# Patient Record
Sex: Male | Born: 1935 | Race: Black or African American | Hispanic: No | Smoking: Former smoker
Health system: Southern US, Community
[De-identification: ages and names within clinical notes are randomized; demographics above are authoritative.]

## PROBLEM LIST (undated history)

## (undated) DIAGNOSIS — Z87898 Personal history of other specified conditions: Secondary | ICD-10-CM

## (undated) DIAGNOSIS — G4700 Insomnia, unspecified: Secondary | ICD-10-CM

## (undated) DIAGNOSIS — I2510 Atherosclerotic heart disease of native coronary artery without angina pectoris: Secondary | ICD-10-CM

## (undated) DIAGNOSIS — E785 Hyperlipidemia, unspecified: Secondary | ICD-10-CM

## (undated) DIAGNOSIS — K759 Inflammatory liver disease, unspecified: Secondary | ICD-10-CM

## (undated) DIAGNOSIS — C9110 Chronic lymphocytic leukemia of B-cell type not having achieved remission: Secondary | ICD-10-CM

## (undated) DIAGNOSIS — G20 Parkinson's disease: Secondary | ICD-10-CM

## (undated) DIAGNOSIS — I739 Peripheral vascular disease, unspecified: Secondary | ICD-10-CM

## (undated) DIAGNOSIS — Z8679 Personal history of other diseases of the circulatory system: Secondary | ICD-10-CM

## (undated) DIAGNOSIS — K219 Gastro-esophageal reflux disease without esophagitis: Secondary | ICD-10-CM

## (undated) DIAGNOSIS — I499 Cardiac arrhythmia, unspecified: Secondary | ICD-10-CM

## (undated) DIAGNOSIS — F17201 Nicotine dependence, unspecified, in remission: Secondary | ICD-10-CM

## (undated) DIAGNOSIS — I10 Essential (primary) hypertension: Secondary | ICD-10-CM

## (undated) DIAGNOSIS — G459 Transient cerebral ischemic attack, unspecified: Secondary | ICD-10-CM

## (undated) DIAGNOSIS — R0602 Shortness of breath: Secondary | ICD-10-CM

## (undated) DIAGNOSIS — F0390 Unspecified dementia without behavioral disturbance: Secondary | ICD-10-CM

## (undated) HISTORY — DX: Nicotine dependence, unspecified, in remission: F17.201

## (undated) HISTORY — DX: Inflammatory liver disease, unspecified: K75.9

## (undated) HISTORY — DX: Atherosclerotic heart disease of native coronary artery without angina pectoris: I25.10

## (undated) HISTORY — DX: Peripheral vascular disease, unspecified: I73.9

## (undated) HISTORY — DX: Essential (primary) hypertension: I10

## (undated) HISTORY — DX: Hyperlipidemia, unspecified: E78.5

## (undated) HISTORY — DX: Personal history of other specified conditions: Z87.898

## (undated) HISTORY — DX: Parkinson's disease: G20

## (undated) HISTORY — DX: Transient cerebral ischemic attack, unspecified: G45.9

## (undated) HISTORY — DX: Personal history of other diseases of the circulatory system: Z86.79

## (undated) HISTORY — PX: TONSILLECTOMY: SUR1361

## (undated) HISTORY — PX: TRANSURETHRAL RESECTION OF PROSTATE: SHX73

## (undated) HISTORY — DX: Chronic lymphocytic leukemia of B-cell type not having achieved remission: C91.10

## (undated) HISTORY — DX: Insomnia, unspecified: G47.00

---

## 1987-08-28 HISTORY — PX: RETINAL DETACHMENT SURGERY: SHX105

## 1994-09-27 DIAGNOSIS — H409 Unspecified glaucoma: Secondary | ICD-10-CM | POA: Insufficient documentation

## 1997-08-27 HISTORY — PX: CORONARY ARTERY BYPASS GRAFT: SHX141

## 1998-04-18 ENCOUNTER — Inpatient Hospital Stay (HOSPITAL_COMMUNITY): Admission: AD | Admit: 1998-04-18 | Discharge: 1998-04-25 | Payer: Self-pay | Admitting: *Deleted

## 1998-04-20 ENCOUNTER — Encounter: Payer: Self-pay | Admitting: Thoracic Surgery (Cardiothoracic Vascular Surgery)

## 1998-04-21 ENCOUNTER — Encounter: Payer: Self-pay | Admitting: Thoracic Surgery (Cardiothoracic Vascular Surgery)

## 1998-08-07 ENCOUNTER — Observation Stay: Admission: EM | Admit: 1998-08-07 | Discharge: 1998-08-08 | Payer: Self-pay | Admitting: Emergency Medicine

## 1998-08-09 ENCOUNTER — Encounter: Admission: RE | Admit: 1998-08-09 | Discharge: 1998-08-09 | Payer: Self-pay | Admitting: Family Medicine

## 1999-01-24 ENCOUNTER — Inpatient Hospital Stay (HOSPITAL_COMMUNITY): Admission: EM | Admit: 1999-01-24 | Discharge: 1999-01-27 | Payer: Self-pay | Admitting: Cardiology

## 1999-07-26 ENCOUNTER — Ambulatory Visit (HOSPITAL_COMMUNITY): Admission: RE | Admit: 1999-07-26 | Discharge: 1999-07-27 | Payer: Self-pay | Admitting: *Deleted

## 2001-05-23 ENCOUNTER — Encounter: Admission: RE | Admit: 2001-05-23 | Discharge: 2001-05-23 | Payer: Self-pay | Admitting: Oncology

## 2001-05-23 ENCOUNTER — Encounter (HOSPITAL_COMMUNITY): Admission: RE | Admit: 2001-05-23 | Discharge: 2001-06-22 | Payer: Self-pay | Admitting: Rheumatology

## 2001-08-22 ENCOUNTER — Encounter: Payer: Self-pay | Admitting: Emergency Medicine

## 2001-08-22 ENCOUNTER — Emergency Department (HOSPITAL_COMMUNITY): Admission: EM | Admit: 2001-08-22 | Discharge: 2001-08-22 | Payer: Self-pay | Admitting: Emergency Medicine

## 2001-09-30 ENCOUNTER — Ambulatory Visit (HOSPITAL_COMMUNITY): Admission: RE | Admit: 2001-09-30 | Discharge: 2001-09-30 | Payer: Self-pay | Admitting: Internal Medicine

## 2002-02-14 ENCOUNTER — Inpatient Hospital Stay (HOSPITAL_COMMUNITY): Admission: EM | Admit: 2002-02-14 | Discharge: 2002-02-16 | Payer: Self-pay | Admitting: *Deleted

## 2002-02-14 ENCOUNTER — Encounter: Payer: Self-pay | Admitting: Internal Medicine

## 2002-02-14 ENCOUNTER — Encounter: Payer: Self-pay | Admitting: *Deleted

## 2002-02-16 ENCOUNTER — Encounter: Payer: Self-pay | Admitting: Internal Medicine

## 2002-02-17 ENCOUNTER — Ambulatory Visit (HOSPITAL_COMMUNITY): Admission: RE | Admit: 2002-02-17 | Discharge: 2002-02-17 | Payer: Self-pay | Admitting: Cardiology

## 2002-02-24 ENCOUNTER — Encounter: Admission: RE | Admit: 2002-02-24 | Discharge: 2002-02-24 | Payer: Self-pay | Admitting: Oncology

## 2002-08-25 ENCOUNTER — Emergency Department (HOSPITAL_COMMUNITY): Admission: EM | Admit: 2002-08-25 | Discharge: 2002-08-25 | Payer: Self-pay | Admitting: Emergency Medicine

## 2002-08-25 ENCOUNTER — Encounter: Payer: Self-pay | Admitting: Emergency Medicine

## 2002-08-25 ENCOUNTER — Inpatient Hospital Stay (HOSPITAL_COMMUNITY): Admission: EM | Admit: 2002-08-25 | Discharge: 2002-08-27 | Payer: Self-pay | Admitting: Cardiovascular Disease

## 2003-02-26 ENCOUNTER — Encounter (HOSPITAL_COMMUNITY): Admission: RE | Admit: 2003-02-26 | Discharge: 2003-03-28 | Payer: Self-pay | Admitting: Oncology

## 2003-02-26 ENCOUNTER — Encounter: Admission: RE | Admit: 2003-02-26 | Discharge: 2003-02-26 | Payer: Self-pay | Admitting: Oncology

## 2003-03-05 ENCOUNTER — Ambulatory Visit (HOSPITAL_COMMUNITY): Admission: RE | Admit: 2003-03-05 | Discharge: 2003-03-05 | Payer: Self-pay | Admitting: Pulmonary Disease

## 2003-07-28 ENCOUNTER — Ambulatory Visit (HOSPITAL_COMMUNITY): Admission: RE | Admit: 2003-07-28 | Discharge: 2003-07-28 | Payer: Self-pay | Admitting: Internal Medicine

## 2004-03-03 ENCOUNTER — Encounter: Admission: RE | Admit: 2004-03-03 | Discharge: 2004-03-03 | Payer: Self-pay | Admitting: Oncology

## 2004-03-03 ENCOUNTER — Encounter (HOSPITAL_COMMUNITY): Admission: RE | Admit: 2004-03-03 | Discharge: 2004-04-02 | Payer: Self-pay | Admitting: Oncology

## 2004-04-04 ENCOUNTER — Encounter: Admission: RE | Admit: 2004-04-04 | Discharge: 2004-04-04 | Payer: Self-pay | Admitting: Oncology

## 2004-04-17 ENCOUNTER — Other Ambulatory Visit: Admission: RE | Admit: 2004-04-17 | Discharge: 2004-04-17 | Payer: Self-pay | Admitting: Dermatology

## 2004-07-03 ENCOUNTER — Emergency Department (HOSPITAL_COMMUNITY): Admission: EM | Admit: 2004-07-03 | Discharge: 2004-07-03 | Payer: Self-pay | Admitting: Emergency Medicine

## 2004-09-04 ENCOUNTER — Emergency Department (HOSPITAL_COMMUNITY): Admission: EM | Admit: 2004-09-04 | Discharge: 2004-09-04 | Payer: Self-pay | Admitting: Emergency Medicine

## 2004-09-04 ENCOUNTER — Ambulatory Visit: Payer: Self-pay | Admitting: Orthopedic Surgery

## 2004-09-18 ENCOUNTER — Ambulatory Visit: Payer: Self-pay | Admitting: Orthopedic Surgery

## 2004-10-31 ENCOUNTER — Ambulatory Visit: Payer: Self-pay | Admitting: Cardiology

## 2004-11-15 ENCOUNTER — Encounter (HOSPITAL_COMMUNITY): Admission: RE | Admit: 2004-11-15 | Discharge: 2004-12-15 | Payer: Self-pay | Admitting: Cardiology

## 2004-11-15 ENCOUNTER — Ambulatory Visit: Payer: Self-pay | Admitting: *Deleted

## 2004-12-18 ENCOUNTER — Ambulatory Visit: Payer: Self-pay | Admitting: Orthopedic Surgery

## 2005-03-15 ENCOUNTER — Ambulatory Visit: Payer: Self-pay | Admitting: Orthopedic Surgery

## 2005-04-04 ENCOUNTER — Ambulatory Visit (HOSPITAL_COMMUNITY): Payer: Self-pay | Admitting: Oncology

## 2005-04-04 ENCOUNTER — Encounter (HOSPITAL_COMMUNITY): Admission: RE | Admit: 2005-04-04 | Discharge: 2005-05-04 | Payer: Self-pay | Admitting: Oncology

## 2005-04-04 ENCOUNTER — Encounter: Admission: RE | Admit: 2005-04-04 | Discharge: 2005-04-04 | Payer: Self-pay | Admitting: Oncology

## 2005-05-21 ENCOUNTER — Encounter: Admission: RE | Admit: 2005-05-21 | Discharge: 2005-05-25 | Payer: Self-pay | Admitting: Oncology

## 2005-05-21 ENCOUNTER — Encounter (HOSPITAL_COMMUNITY): Admission: RE | Admit: 2005-05-21 | Discharge: 2005-05-25 | Payer: Self-pay | Admitting: Oncology

## 2005-07-24 ENCOUNTER — Encounter (HOSPITAL_COMMUNITY): Admission: RE | Admit: 2005-07-24 | Discharge: 2005-08-23 | Payer: Self-pay | Admitting: Oncology

## 2005-07-24 ENCOUNTER — Ambulatory Visit (HOSPITAL_COMMUNITY): Payer: Self-pay | Admitting: Oncology

## 2005-07-24 ENCOUNTER — Encounter: Admission: RE | Admit: 2005-07-24 | Discharge: 2005-07-24 | Payer: Self-pay | Admitting: Oncology

## 2005-08-12 ENCOUNTER — Inpatient Hospital Stay (HOSPITAL_COMMUNITY): Admission: EM | Admit: 2005-08-12 | Discharge: 2005-08-15 | Payer: Self-pay | Admitting: Emergency Medicine

## 2005-08-13 ENCOUNTER — Ambulatory Visit: Payer: Self-pay | Admitting: *Deleted

## 2005-08-16 ENCOUNTER — Ambulatory Visit (HOSPITAL_COMMUNITY): Admission: RE | Admit: 2005-08-16 | Discharge: 2005-08-16 | Payer: Self-pay | Admitting: Pulmonary Disease

## 2005-09-14 ENCOUNTER — Encounter (INDEPENDENT_AMBULATORY_CARE_PROVIDER_SITE_OTHER): Payer: Self-pay | Admitting: Urology

## 2005-09-14 ENCOUNTER — Inpatient Hospital Stay (HOSPITAL_COMMUNITY): Admission: RE | Admit: 2005-09-14 | Discharge: 2005-09-17 | Payer: Self-pay | Admitting: Urology

## 2005-12-04 ENCOUNTER — Emergency Department (HOSPITAL_COMMUNITY): Admission: EM | Admit: 2005-12-04 | Discharge: 2005-12-04 | Payer: Self-pay | Admitting: Emergency Medicine

## 2005-12-10 ENCOUNTER — Ambulatory Visit (HOSPITAL_COMMUNITY): Admission: RE | Admit: 2005-12-10 | Discharge: 2005-12-10 | Payer: Self-pay | Admitting: Pulmonary Disease

## 2005-12-10 ENCOUNTER — Ambulatory Visit: Payer: Self-pay | Admitting: Cardiology

## 2005-12-11 ENCOUNTER — Ambulatory Visit (HOSPITAL_COMMUNITY): Payer: Self-pay | Admitting: Oncology

## 2005-12-11 ENCOUNTER — Encounter (HOSPITAL_COMMUNITY): Admission: RE | Admit: 2005-12-11 | Discharge: 2006-01-10 | Payer: Self-pay | Admitting: Oncology

## 2005-12-11 ENCOUNTER — Encounter: Admission: RE | Admit: 2005-12-11 | Discharge: 2005-12-11 | Payer: Self-pay | Admitting: Oncology

## 2006-02-12 ENCOUNTER — Ambulatory Visit: Payer: Self-pay | Admitting: Internal Medicine

## 2006-02-15 ENCOUNTER — Ambulatory Visit: Payer: Self-pay | Admitting: Internal Medicine

## 2006-02-15 ENCOUNTER — Ambulatory Visit (HOSPITAL_COMMUNITY): Admission: RE | Admit: 2006-02-15 | Discharge: 2006-02-15 | Payer: Self-pay | Admitting: Internal Medicine

## 2006-05-07 ENCOUNTER — Ambulatory Visit (HOSPITAL_COMMUNITY): Admission: RE | Admit: 2006-05-07 | Discharge: 2006-05-07 | Payer: Self-pay | Admitting: Ophthalmology

## 2006-05-08 ENCOUNTER — Encounter: Admission: RE | Admit: 2006-05-08 | Discharge: 2006-05-24 | Payer: Self-pay | Admitting: Oncology

## 2006-05-08 ENCOUNTER — Encounter (HOSPITAL_COMMUNITY): Admission: RE | Admit: 2006-05-08 | Discharge: 2006-05-24 | Payer: Self-pay | Admitting: Oncology

## 2006-05-08 ENCOUNTER — Ambulatory Visit (HOSPITAL_COMMUNITY): Payer: Self-pay | Admitting: Oncology

## 2006-07-08 ENCOUNTER — Emergency Department (HOSPITAL_COMMUNITY): Admission: EM | Admit: 2006-07-08 | Discharge: 2006-07-08 | Payer: Self-pay | Admitting: Emergency Medicine

## 2006-09-04 ENCOUNTER — Ambulatory Visit (HOSPITAL_COMMUNITY): Payer: Self-pay | Admitting: Oncology

## 2006-09-04 ENCOUNTER — Encounter (HOSPITAL_COMMUNITY): Admission: RE | Admit: 2006-09-04 | Discharge: 2006-10-04 | Payer: Self-pay | Admitting: Oncology

## 2006-10-13 ENCOUNTER — Emergency Department (HOSPITAL_COMMUNITY): Admission: EM | Admit: 2006-10-13 | Discharge: 2006-10-13 | Payer: Self-pay | Admitting: Emergency Medicine

## 2007-03-07 ENCOUNTER — Ambulatory Visit (HOSPITAL_COMMUNITY): Payer: Self-pay | Admitting: Oncology

## 2007-03-07 ENCOUNTER — Encounter (HOSPITAL_COMMUNITY): Admission: RE | Admit: 2007-03-07 | Discharge: 2007-04-06 | Payer: Self-pay | Admitting: Oncology

## 2007-09-16 IMAGING — NM NM MYOCAR PERF EJECTION FRACTION
2 series · 12 of 12 positions shown · non-contrast
Comparison: none

CLINICAL DATA: 84-year-old gentleman with prior CABG surgery admitted for diaphoresis  accompanied by mild chest discomfort and dyspnea.  
STRESS MYOVIEW STUDY:
Radionuclide data:  One day stress/rest protocol performed with [DATE] mCi Nc-TTm Myoview.  
Stress data:  Treadmill exercise to a workload of 9 mets and a heart rate of 138, 100% of age-predicted maximum.  Exercise discontinued due to claudication and fatigue; no chest pain nor dyspnea described.  Blood pressure increased from a resting value of 145/70 to 170/80 during exercise and 200/80 early in recovery, a minimally hypertensive response.  No significant arrhythmias--few PVC?s and paired PVC?s.  
EKG:  Sinus bradycardia with atrial bigeminy; indeterminate axis; prior anteroseptal myocardial infarction; lateral loss of T-wave voltage.  
Stress EKG:  Insignificant upsloping ST segment depression.  
Scintigraphic data:  Acquisition notable for minor movement for which no correction was applied.  There was mild diaphragmatic attenuation.  Left ventricular size was normal.  On tomographic images reconstructed in standard planes, there was a very small area of thinning in the basilar inferior wall.  This was not numerically significant, and no reversibility was apparent.
The gated reconstruction demonstrated normal regional and global LV systolic function as well as normal systolic accentuation of activity throughout.  Estimated ejection fraction was .68.

[Series 1: cr cardiac tc low dose · 6.52mm/px · 6 of 64 frames shown]
[frame 6/64]
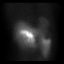
[frame 16/64]
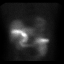
[frame 27/64]
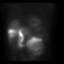
[frame 38/64]
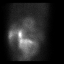
[frame 48/64]
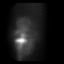
[frame 59/64]
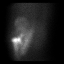

[Series 1: cs cardiac tc hi dose · 6.52mm/px · 6 of 512 frames shown]
[frame 43/512]
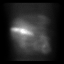
[frame 128/512]
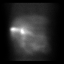
[frame 214/512]
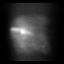
[frame 299/512]
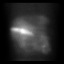
[frame 384/512]
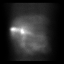
[frame 470/512]
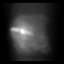

[12 of 12 positions shown; findings below may reference images not displayed]

IMPRESSION: Negative stress Myoview study revealing adequate exercise tolerance, no significant stress-induced EKG abnormalities, normal left ventricular size and normal left ventricular systolic function.  By scintigraphic imaging, there was mild diaphragmatic attenuation without evidence for ischemia or infarction.  Other findings as noted.

## 2007-09-23 ENCOUNTER — Emergency Department (HOSPITAL_COMMUNITY): Admission: EM | Admit: 2007-09-23 | Discharge: 2007-09-23 | Payer: Self-pay | Admitting: Emergency Medicine

## 2007-10-08 ENCOUNTER — Ambulatory Visit (HOSPITAL_COMMUNITY): Payer: Self-pay | Admitting: Oncology

## 2007-10-30 ENCOUNTER — Ambulatory Visit: Payer: Self-pay | Admitting: Cardiology

## 2007-11-12 ENCOUNTER — Ambulatory Visit: Payer: Self-pay | Admitting: Cardiology

## 2007-11-12 ENCOUNTER — Encounter: Payer: Self-pay | Admitting: Cardiology

## 2007-11-12 ENCOUNTER — Ambulatory Visit (HOSPITAL_COMMUNITY): Admission: RE | Admit: 2007-11-12 | Discharge: 2007-11-12 | Payer: Self-pay | Admitting: Cardiology

## 2007-12-24 ENCOUNTER — Ambulatory Visit (HOSPITAL_COMMUNITY): Admission: RE | Admit: 2007-12-24 | Discharge: 2007-12-24 | Payer: Self-pay | Admitting: Cardiology

## 2007-12-30 ENCOUNTER — Ambulatory Visit: Payer: Self-pay | Admitting: Cardiology

## 2008-08-27 DIAGNOSIS — G20 Parkinson's disease: Secondary | ICD-10-CM

## 2008-08-27 DIAGNOSIS — G20A1 Parkinson's disease without dyskinesia, without mention of fluctuations: Secondary | ICD-10-CM

## 2008-08-27 HISTORY — DX: Parkinson's disease: G20

## 2008-08-27 HISTORY — DX: Parkinson's disease without dyskinesia, without mention of fluctuations: G20.A1

## 2008-10-06 ENCOUNTER — Encounter (HOSPITAL_COMMUNITY): Admission: RE | Admit: 2008-10-06 | Discharge: 2008-11-05 | Payer: Self-pay | Admitting: Oncology

## 2008-10-06 ENCOUNTER — Ambulatory Visit (HOSPITAL_COMMUNITY): Payer: Self-pay | Admitting: Oncology

## 2008-10-26 ENCOUNTER — Ambulatory Visit: Payer: Self-pay | Admitting: Cardiology

## 2008-10-26 ENCOUNTER — Inpatient Hospital Stay (HOSPITAL_COMMUNITY): Admission: EM | Admit: 2008-10-26 | Discharge: 2008-10-28 | Payer: Self-pay | Admitting: Emergency Medicine

## 2009-02-01 ENCOUNTER — Encounter: Payer: Self-pay | Admitting: Physician Assistant

## 2009-02-01 ENCOUNTER — Ambulatory Visit: Payer: Self-pay | Admitting: Cardiology

## 2009-02-10 ENCOUNTER — Encounter: Payer: Self-pay | Admitting: Cardiology

## 2009-02-24 ENCOUNTER — Encounter: Payer: Self-pay | Admitting: Cardiology

## 2009-02-24 LAB — CONVERTED CEMR LAB
HDL: 60 mg/dL (ref >39)
LDL Cholesterol: 87 mg/dL (ref 0–99)
Triglycerides: 90 mg/dL (ref <150)
VLDL: 18 mg/dL (ref 0–40)

## 2009-03-08 ENCOUNTER — Telehealth (INDEPENDENT_AMBULATORY_CARE_PROVIDER_SITE_OTHER): Payer: Self-pay

## 2009-03-10 ENCOUNTER — Encounter (INDEPENDENT_AMBULATORY_CARE_PROVIDER_SITE_OTHER): Payer: Self-pay | Admitting: *Deleted

## 2009-06-21 ENCOUNTER — Encounter (INDEPENDENT_AMBULATORY_CARE_PROVIDER_SITE_OTHER): Payer: Self-pay | Admitting: *Deleted

## 2009-06-21 ENCOUNTER — Ambulatory Visit: Payer: Self-pay | Admitting: Cardiology

## 2009-06-21 ENCOUNTER — Ambulatory Visit (HOSPITAL_COMMUNITY): Admission: RE | Admit: 2009-06-21 | Discharge: 2009-06-21 | Payer: Self-pay | Admitting: Cardiology

## 2009-06-21 DIAGNOSIS — I2510 Atherosclerotic heart disease of native coronary artery without angina pectoris: Secondary | ICD-10-CM | POA: Insufficient documentation

## 2009-06-21 DIAGNOSIS — G459 Transient cerebral ischemic attack, unspecified: Secondary | ICD-10-CM | POA: Insufficient documentation

## 2009-06-21 DIAGNOSIS — Z954 Presence of other heart-valve replacement: Secondary | ICD-10-CM | POA: Insufficient documentation

## 2009-06-21 DIAGNOSIS — Z8669 Personal history of other diseases of the nervous system and sense organs: Secondary | ICD-10-CM | POA: Insufficient documentation

## 2009-06-21 DIAGNOSIS — I739 Peripheral vascular disease, unspecified: Secondary | ICD-10-CM | POA: Insufficient documentation

## 2009-06-30 ENCOUNTER — Encounter: Payer: Self-pay | Admitting: Cardiology

## 2009-07-06 ENCOUNTER — Ambulatory Visit (HOSPITAL_COMMUNITY): Admission: RE | Admit: 2009-07-06 | Discharge: 2009-07-06 | Payer: Self-pay | Admitting: Neurology

## 2009-08-02 ENCOUNTER — Encounter: Payer: Self-pay | Admitting: Cardiology

## 2009-08-02 ENCOUNTER — Encounter (INDEPENDENT_AMBULATORY_CARE_PROVIDER_SITE_OTHER): Payer: Self-pay | Admitting: *Deleted

## 2009-08-02 LAB — CONVERTED CEMR LAB
Albumin: 4.2 g/dL
Alkaline Phosphatase: 84 units/L
BUN: 15 mg/dL
CO2: 21 meq/L
Calcium: 9.1 mg/dL
Cholesterol: 250 mg/dL
Glucose, Bld: 97 mg/dL
Sodium: 143 meq/L

## 2009-08-04 ENCOUNTER — Encounter (INDEPENDENT_AMBULATORY_CARE_PROVIDER_SITE_OTHER): Payer: Self-pay | Admitting: *Deleted

## 2009-08-04 LAB — CONVERTED CEMR LAB
ALT: 14 units/L (ref 0–53)
AST: 19 units/L (ref 0–37)
Alkaline Phosphatase: 84 units/L (ref 39–117)
Creatinine, Ser: 1.07 mg/dL (ref 0.40–1.50)
LDL Cholesterol: 154 mg/dL — ABNORMAL HIGH (ref 0–99)
Sodium: 143 meq/L (ref 135–145)
Total Bilirubin: 0.6 mg/dL (ref 0.3–1.2)
Total CHOL/HDL Ratio: 3.4
Total Protein: 6.8 g/dL (ref 6.0–8.3)
VLDL: 22 mg/dL (ref 0–40)

## 2009-08-05 ENCOUNTER — Telehealth (INDEPENDENT_AMBULATORY_CARE_PROVIDER_SITE_OTHER): Payer: Self-pay | Admitting: *Deleted

## 2009-08-27 HISTORY — PX: COLONOSCOPY: SHX174

## 2009-09-04 ENCOUNTER — Emergency Department (HOSPITAL_COMMUNITY): Admission: EM | Admit: 2009-09-04 | Discharge: 2009-09-05 | Payer: Self-pay | Admitting: Emergency Medicine

## 2009-10-05 ENCOUNTER — Ambulatory Visit (HOSPITAL_COMMUNITY): Payer: Self-pay | Admitting: Oncology

## 2009-10-18 ENCOUNTER — Encounter (INDEPENDENT_AMBULATORY_CARE_PROVIDER_SITE_OTHER): Payer: Self-pay | Admitting: *Deleted

## 2009-10-18 LAB — CONVERTED CEMR LAB
HDL: 65 mg/dL
Triglycerides: 132 mg/dL

## 2009-10-25 ENCOUNTER — Encounter (INDEPENDENT_AMBULATORY_CARE_PROVIDER_SITE_OTHER): Payer: Self-pay

## 2009-10-25 ENCOUNTER — Encounter: Payer: Self-pay | Admitting: Cardiology

## 2009-10-25 LAB — CONVERTED CEMR LAB
Cholesterol: 235 mg/dL — ABNORMAL HIGH (ref 0–200)
LDL Cholesterol: 144 mg/dL — ABNORMAL HIGH (ref 0–99)
Triglycerides: 132 mg/dL (ref <150)

## 2009-10-26 ENCOUNTER — Encounter (INDEPENDENT_AMBULATORY_CARE_PROVIDER_SITE_OTHER): Payer: Self-pay

## 2009-10-26 ENCOUNTER — Encounter: Payer: Self-pay | Admitting: Cardiology

## 2009-12-29 ENCOUNTER — Encounter (INDEPENDENT_AMBULATORY_CARE_PROVIDER_SITE_OTHER): Payer: Self-pay | Admitting: *Deleted

## 2009-12-29 LAB — CONVERTED CEMR LAB
HDL: 79 mg/dL (ref >39)
LDL Cholesterol: 76 mg/dL (ref 0–99)
Total CHOL/HDL Ratio: 2.2
Triglycerides: 73 mg/dL
VLDL: 15 mg/dL (ref 0–40)

## 2010-01-02 ENCOUNTER — Encounter (INDEPENDENT_AMBULATORY_CARE_PROVIDER_SITE_OTHER): Payer: Self-pay | Admitting: *Deleted

## 2010-03-08 ENCOUNTER — Encounter (INDEPENDENT_AMBULATORY_CARE_PROVIDER_SITE_OTHER): Payer: Self-pay | Admitting: *Deleted

## 2010-03-16 ENCOUNTER — Ambulatory Visit: Payer: Self-pay | Admitting: Cardiology

## 2010-03-16 LAB — CONVERTED CEMR LAB
AST: 18 units/L (ref 0–37)
Alkaline Phosphatase: 86 units/L (ref 39–117)
BUN: 19 mg/dL (ref 6–23)
Basophils Relative: 0 % (ref 0–1)
Creatinine, Ser: 1.19 mg/dL (ref 0.40–1.50)
Eosinophils Absolute: 0 10*3/uL (ref 0.0–0.7)
Hemoglobin: 12.9 g/dL — ABNORMAL LOW (ref 13.0–17.0)
MCHC: 33.9 g/dL (ref 30.0–36.0)
MCV: 88.6 fL (ref 78.0–100.0)
Monocytes Absolute: 0.3 10*3/uL (ref 0.1–1.0)
Monocytes Relative: 9 % (ref 3–12)
RBC: 4.3 M/uL (ref 4.22–5.81)
Total Bilirubin: 0.5 mg/dL (ref 0.3–1.2)

## 2010-09-28 NOTE — Miscellaneous (Signed)
**Note De-Identified Grazia Taffe Obfuscation** Summary: Medications update  Clinical Lists Changes  Medications: Changed medication from SIMVASTATIN 40 MG TABS (SIMVASTATIN) Take 1 tablet by mouth at bedtime to SIMVASTATIN 80 MG TABS (SIMVASTATIN) take 1 tablet at bedtime - Signed Changed medication from NIASPAN 1000 MG CR-TABS (NIACIN (ANTIHYPERLIPIDEMIC)) take one half table daily x2 weeks then 1 tablet x2 weeks, then one and one half tablets daily to NIASPAN 1000 MG CR-TABS (NIACIN (ANTIHYPERLIPIDEMIC)) Take 1 and 1/2 tablets by mouth at bedtime - Signed Rx of SIMVASTATIN 80 MG TABS (SIMVASTATIN) take 1 tablet at bedtime;  #30 x 6;  Signed;  Entered by: Larita Fife Lisaann Atha LPN;  Authorized by: Kathlen Brunswick, MD, [facility];  Method used: Electronically to [facility] [address] [phone]*, [address] [place], [place]  [address], Ph: [phone], Fax: [phone] Rx of NIASPAN 1000 MG CR-TABS (NIACIN (ANTIHYPERLIPIDEMIC)) Take 1 and 1/2 tablets by mouth at bedtime;  #45 x 6;  Signed;  Entered by: Larita Fife Kyrie Bun LPN;  Authorized by: Kathlen Brunswick, MD, [facility];  Method used: Electronically to [facility] [address] [phone]*, [address] [place], [place]  [address], Ph: [phone], Fax: [phone]    Prescriptions: NIASPAN 1000 MG CR-TABS (NIACIN (ANTIHYPERLIPIDEMIC)) Take 1 and 1/2 tablets by mouth at bedtime  #45 x 6   Entered by:   Larita Fife Wanona Stare LPN   Authorized by:   Kathlen Brunswick, MD, [facility]   Signed by:   Larita Fife Lamondre Wesche LPN on 63/87/5643   Method used:   Electronically to        [facility] [phone]* (retail)       [address] [place], [place]  [address]       Ph: [phone]       Fax: [phone]   RxID:   [id number] SIMVASTATIN 80 MG TABS (SIMVASTATIN) take 1 tablet at bedtime  #30 x 6   Entered by:   Larita Fife Daisja Kessinger LPN   Authorized by:   Kathlen Brunswick, MD, [facility]   Signed by:   Larita Fife Cassandra Mcmanaman LPN on 51/76/1607   Method used:   Electronically to        [facility] [phone]* (retail)       [address] [place], [place]   [address]       Ph: [phone]       Fax: [phone]   RxID:   [id number]

## 2010-09-28 NOTE — Miscellaneous (Signed)
Summary: labs cmp,lipids,08/02/2009,lipids 10/18/2009,lipids 12/29/2009  Clinical Lists Changes  Observations: Added new observation of LDL: 76 mg/dL (81/19/1478 29:56) Added new observation of HDL: 79 mg/dL (21/30/8657 84:69) Added new observation of TRIGLYC TOT: 73 mg/dL (62/95/2841 32:44) Added new observation of CHOLESTEROL: 170 mg/dL (08/29/7251 66:44) Added new observation of LDL: 144 mg/dL (03/47/4259 56:38) Added new observation of HDL: 65 mg/dL (75/64/3329 51:88) Added new observation of TRIGLYC TOT: 132 mg/dL (41/66/0630 16:01) Added new observation of CHOLESTEROL: 235 mg/dL (09/32/3557 32:20) Added new observation of CALCIUM: 9.1 mg/dL (25/42/7062 37:62) Added new observation of ALBUMIN: 4.2 g/dL (83/15/1761 60:73) Added new observation of PROTEIN, TOT: 6.8 g/dL (71/01/2693 85:46) Added new observation of SGPT (ALT): 14 units/L (08/02/2009 14:07) Added new observation of SGOT (AST): 19 units/L (08/02/2009 14:07) Added new observation of ALK PHOS: 84 units/L (08/02/2009 14:07) Added new observation of CREATININE: 1.07 mg/dL (27/10/5007 38:18) Added new observation of BUN: 15 mg/dL (29/93/7169 67:89) Added new observation of BG RANDOM: 97 mg/dL (38/05/1750 02:58) Added new observation of CO2 PLSM/SER: 21 meq/L (08/02/2009 14:07) Added new observation of CL SERUM: 108 meq/L (08/02/2009 14:07) Added new observation of K SERUM: 4.6 meq/L (08/02/2009 14:07) Added new observation of NA: 143 meq/L (08/02/2009 14:07) Added new observation of LDL: 154 mg/dL (52/77/8242 35:36) Added new observation of HDL: 74 mg/dL (14/43/1540 08:67) Added new observation of TRIGLYC TOT: 112 mg/dL (61/95/0932 67:12) Added new observation of CHOLESTEROL: 250 mg/dL (45/80/9983 38:25)

## 2010-09-28 NOTE — Letter (Signed)
Summary: Handout Printed  Printed Handout:  - Diet - Low-Cholesterol Guidelines

## 2010-09-28 NOTE — Letter (Signed)
Summary: [facility] Results Engineer, agricultural at [facility]  [address], [place] [address]   Phone: [phone]  Fax: [phone]      Jan 02, 2010 MRN: [id number]   [name] [address] [place], [place]  [address]   Dear [name],  Your test ordered by Selena Batten has been reviewed by your physician (or physician assistant) and was found to be normal or stable. Your physician (or physician assistant) felt no changes were needed at this time.  ____ Echocardiogram  ____ Cardiac Stress Test  __x  Lab Work  ____ Peripheral vascular study of arms, legs or neck  ____ CT scan or X-ray  ____ Lung or Breathing test  ____ Other:  No change in medical treatment at this time, per Dr. Dietrich Pates.  Enclosed is a copy of your labwork for your records.  Thank you, Selah Klang Allyne Gee RN    Scott Bing, MD, [facility].C.Gaylord Shih, MD, F.A.C.C [name], MD, F.A.C.C Nona Dell, MD, F.A.C.C Charlton Haws, MD, [facility].C.C

## 2010-09-28 NOTE — Miscellaneous (Signed)
Summary: chest xray 09/04/2009  Clinical Lists Changes  Observations: Added new observation of CXR RESULTS:  Clinical Data: Dizziness.  Weakness.    CHEST - 2 VIEW    Comparison: 06/21/2009.    Findings: CABG/median sternotomy.  No airspace disease or effusion.   Monitoring leads are projected over the chest.  The   cardiopericardial silhouette appears within normal limits.   Emphysema.  Trachea and paratracheal soft tissues appear within   normal limits.    IMPRESSION:   Emphysema and CABG.  No acute cardiopulmonary disease.    Read By:  Wynn Banker   Released By:  Wynn Banker  Additional Information (09/04/2009 14:14)       CXR  Procedure date:  09/04/2009  Findings:       Clinical Data: Dizziness.  Weakness.    CHEST - 2 VIEW    Comparison: 06/21/2009.    Findings: CABG/median sternotomy.  No airspace disease or effusion.   Monitoring leads are projected over the chest.  The   cardiopericardial silhouette appears within normal limits.   Emphysema.  Trachea and paratracheal soft tissues appear within   normal limits.    IMPRESSION:   Emphysema and CABG.  No acute cardiopulmonary disease.    Read By:  Wynn Banker   Released By:  Wynn Banker  Additional Information

## 2010-09-28 NOTE — Assessment & Plan Note (Signed)
Summary: 8 mth f/u per checkout on 06/21/09/tg  Medications Added LISINOPRIL-HYDROCHLOROTHIAZIDE 20-12.5 MG TABS (LISINOPRIL-HYDROCHLOROTHIAZIDE) Take 1 tablet by mouth once a day      Allergies Added: ! * AMBIEN  Visit Type:  8 month follow up Referring Provider:  Neurology-Dr. Gerilyn Pilgrim Primary Provider:  Dr. Juanetta Gosling   History of Present Illness: Mr. [name] returns to the office as scheduled for continuing assessment and treatment of a history of aortic valve disease with subsequent AVR and coronary artery disease with CABG surgery in 1999.   He required percutaneous intervention 11 years ago, but no subsequent attention for coronary or valvular disease.  His most recent cardiac catheterization was in 2003 and echocardiogram in 2006.  He denies orthopnea, PND, chest discomfort, lightheadedness, syncope or exertional dyspnea.  Lifestyle is sedentary.  He was evaluated by a neurologist, who verified the diagnosis of Parkinson's disease, which he characterized as mild, associated with mild dementia.  No immediate medical therapy was advised.  Patient was given Ambien for sleep, but this resulted in deterioration in mental status.  Preventive Screening-Counseling & Management  Alcohol-Tobacco     Smoking Status: quit  Current Medications (verified): 1)  Omeprazole 20 Mg Cpdr (Omeprazole) .... Take 1 Tablet By Mouth Once A Day 2)  Lisinopril-Hydrochlorothiazide 20-12.5 Mg Tabs (Lisinopril-Hydrochlorothiazide) .... Take 1 Tablet By Mouth Once A Day 3)  Aspirin 81 Mg Tbec (Aspirin) .... Take One Tablet By Mouth Daily 4)  Xalatan 0.005 % Soln (Latanoprost) .Marland Kitchen.. 1 Drop Both Eyes Daily 5)  Daily Multi  Tabs (Multiple Vitamins-Minerals) .... Take 1 Tab Daily 6)  Simvastatin 80 Mg Tabs (Simvastatin) .... Take 1 Tablet At Bedtime  Allergies (verified): 1)  ! * Shell Fish 2)  ! * Ambien  Past History:  PMH, FH, and Social History reviewed and updated.  Past Medical  History: ATHEROSCLEROTIC CARDIOVASCULAR DISEASE (ICD-429.2)-coronary artery bypass graft and bioprosthetic aortic valve replacement in 1999; PCI of the right coronary in 2000; catheter in 07/2002 revealed total insertion of the left anterior descending; patent LIMA graft; total obstruction of the saphenous vein graft to the first marginal with a patent terminal limb between the first and second marginals; patent circumflex stent; nondominant RCA; normal ejection fraction. 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  Vital Signs:  Patient profile:   77 year old male Height:      66 inches Weight:      163 pounds BMI:     26.40 O2 Sat:      97 % on Room air Temp:     98.5 degrees F oral Pulse rate:   59 / minute BP sitting:   141 / 69  (left arm)  Vitals Entered ByLarita Fife Via LPN (March 16, 2010 11:20 AM)  O2 Flow:  Room air  Physical Exam  General:  Proportionate height and weight; well developed; no acute distress; Suboptimal recall of history and symptoms;    Neck-No JVD; no carotid bruits: Lungs-No tachypnea, no rales;mild expiratory rhonchi bilaterally Cardiovascular-normal PMI; normal S1 and S2; grade 1-2/6 systolic ejection murmur Abdomen-BS normal; soft and non-tender without  masses or organomegaly:  Musculoskeletal-No deformities, no cyanosis or clubbing: Neurologic-Normal cranial nerves; symmetric strength and tone; mild tremor; decreased  facial expression; shuffling gait; bradykinesia Skin-Warm, no significant lesions: Extremities-absent dorsalis pedis pulses and minimal, if any, posterior tibial pulses; right DP cannot be located with Doppler; other pulses are monophasic and deformed in contour; no edema:     Impression & Recommendations:  Problem # 1:  ATHEROSCLEROTIC CARDIOVASCULAR DISEASE (ICD-429.2) Patient is doing very well from a symptomatic standpoint.  Control of cardiovascular risk factors is excellent.  Current medications will be continued.  Metabolic profile and CBC are pending.  Problem # 2:  HYPERTENSION (ICD-401.9) Blood pressure control is good.  Medications will be monitored both in terms of effectiveness and possible adverse effects.  Appropriate laboratory studies are pending.  Problem # 3:  HYPERLIPIDEMIA (ICD-272.4) Recent lipid profile was excellent with total cholesterol of 170, triglycerides of 73, HDL 79 and LDL 76.  Simvastatin dosage is maximal, but continuation is appropriate based upon recent FDA guidelines.  I will reassess this nice gentleman in one year.  Problem # 4:  PARKINSON'S DISEASE (ICD-332.0) We will verify that patient is scheduled for a return visit with his neurologist.  He evidently has a degree of dementia and might benefit from medical therapy either for dementia in general or for Parkinson's disease.  Problem # 5:  PERIPHERAL VASCULAR DISEASE (ICD-443.9) Circulation appears to be at least moderately and perhaps severely impaired by examination with a handheld Doppler, but patient denies any symptoms.  Accordingly, no further testing nor therapy is warranted.  Other Orders: T-Comprehensive Metabolic Panel [phone]) T-CBC w/Diff [phone])  Patient Instructions: 1)  Your physician recommends that you schedule a follow-up appointment in: 1 year 2)  Your physician recommends that you return for lab work in: today 3)  Make sure you have a follow up appt with Dr.  Gerilyn Pilgrim within the next 6 months  Prevention & Chronic Care Immunizations   Influenza vaccine: Not documented    Tetanus booster: Not documented    Pneumococcal vaccine: Not documented    H. zoster vaccine: Not documented  Colorectal Screening   Hemoccult: Not documented    Colonoscopy: Not documented  Other Screening   PSA: Not documented   Smoking status: quit  (03/16/2010)    Screening comments: quit in 1981  Lipids   Total Cholesterol: 170  (12/29/2009)   LDL: 76  (12/29/2009)   LDL Direct: Not documented   HDL: 79  (12/29/2009)   Triglycerides: 73  (12/29/2009)    SGOT (AST): 19  (08/02/2009)   SGPT (ALT): 14  (08/02/2009) CMP ordered    Alkaline phosphatase: 84  (08/02/2009)   Total bilirubin: 0.6  (08/02/2009)  Hypertension   Last Blood Pressure: 141 / 69  (03/16/2010)   Serum creatinine: 1.07  (08/02/2009)   Serum potassium 4.6  (08/02/2009) CMP ordered   Self-Management Support :    Hypertension self-management support: Not documented    Lipid self-management support: Not documented

## 2010-09-28 NOTE — Miscellaneous (Signed)
  Clinical Lists Changes  Medications: Changed medication from SIMVASTATIN 80 MG TABS (SIMVASTATIN) take 1 tablet at bedtime to SIMVASTATIN 80 MG TABS (SIMVASTATIN) take 1 tablet at bedtime - Signed Rx of NIASPAN 1000 MG CR-TABS (NIACIN (ANTIHYPERLIPIDEMIC)) Take 1 and 1/2 tablets by mouth at bedtime;  #135 x 3;  Signed;  Entered by: Larita Fife Via LPN;  Authorized by: Kathlen Brunswick, MD, [facility];  Method used: Print then Give to Patient Rx of SIMVASTATIN 80 MG TABS (SIMVASTATIN) take 1 tablet at bedtime;  #90 x 3;  Signed;  Entered by: Larita Fife Via LPN;  Authorized by: Kathlen Brunswick, MD, [facility];  Method used: Print then Give to Patient    Prescriptions: SIMVASTATIN 80 MG TABS (SIMVASTATIN) take 1 tablet at bedtime  #90 x 3   Entered by:   Larita Fife Via LPN   Authorized by:   Kathlen Brunswick, MD, [facility]   Signed by:   Larita Fife Via LPN on 16/05/9603   Method used:   Print then Give to Patient   RxID:   [id number] NIASPAN 1000 MG CR-TABS (NIACIN (ANTIHYPERLIPIDEMIC)) Take 1 and 1/2 tablets by mouth at bedtime  #135 x 3   Entered by:   Larita Fife Via LPN   Authorized by:   Kathlen Brunswick, MD, [facility]   Signed by:   Larita Fife Via LPN on 21/30/8657   Method used:   Print then Give to Patient   RxID:   [id number]

## 2010-09-28 NOTE — Letter (Signed)
Summary: [facility] Work Engineer, agricultural at [employer]  [address], [place] [address]   Phone: [phone]  Fax: [phone]     October 25, 2009 MRN: [id number]   [name] [address] [place], [place]  [address]      YOUR LAB WORK IS DUE  Dec 26, 2009 _________________________________________  Please go to [facility], located across the street from [facility] on the second floor.  Hours are Monday - Friday 7am until 7:30pm         Saturday 8am until 12noon    _X_  DO NOT EAT OR DRINK AFTER MIDNIGHT EVENING PRIOR TO LABWORK  __ YOUR LABWORK IS NOT FASTING --YOU MAY EAT PRIOR TO LABWORK

## 2010-09-28 NOTE — Miscellaneous (Signed)
Summary: chest xray 06/21/2009  Clinical Lists Changes  Observations: Added new observation of CXR RESULTS:   Clinical Data: Wheezing, hypertension    CHEST - 2 VIEW    Comparison: 10/27/2008    Findings:   Normal heart size status post CABG.   Calcified tortuous thoracic aorta.   Pulmonary vascularity normal.   Lungs clear.   No pleural effusion or pneumothorax.   Bones diffusely demineralized.    IMPRESSION:   Status post CABG.   No acute abnormalities.    Read By:  Lollie Marrow,  M.D. (06/21/2009 14:14)      CXR  Procedure date:  06/21/2009  Findings:        Clinical Data: Wheezing, hypertension    CHEST - 2 VIEW    Comparison: 10/27/2008    Findings:   Normal heart size status post CABG.   Calcified tortuous thoracic aorta.   Pulmonary vascularity normal.   Lungs clear.   No pleural effusion or pneumothorax.   Bones diffusely demineralized.    IMPRESSION:   Status post CABG.   No acute abnormalities.    Read By:  Lollie Marrow,  Judie Petit.D.

## 2010-10-04 ENCOUNTER — Ambulatory Visit (HOSPITAL_COMMUNITY): Payer: Medicare Other | Admitting: Oncology

## 2010-11-11 LAB — URINE CULTURE
Colony Count: NO GROWTH
Culture: NO GROWTH

## 2010-11-11 LAB — CBC
HCT: 38.7 % — ABNORMAL LOW (ref 39.0–52.0)
MCV: 91.4 fL (ref 78.0–100.0)
Platelets: 134 10*3/uL — ABNORMAL LOW (ref 150–400)
RDW: 14.4 % (ref 11.5–15.5)
WBC: 3.7 10*3/uL — ABNORMAL LOW (ref 4.0–10.5)

## 2010-11-11 LAB — BASIC METABOLIC PANEL
BUN: 14 mg/dL (ref 6–23)
Chloride: 100 mEq/L (ref 96–112)
Glucose, Bld: 148 mg/dL — ABNORMAL HIGH (ref 70–99)
Potassium: 3.7 mEq/L (ref 3.5–5.1)

## 2010-11-11 LAB — URINALYSIS, ROUTINE W REFLEX MICROSCOPIC
Bilirubin Urine: NEGATIVE
Hgb urine dipstick: NEGATIVE

## 2010-11-11 LAB — DIFFERENTIAL
Eosinophils Absolute: 0.1 10*3/uL (ref 0.0–0.7)
Eosinophils Relative: 2 % (ref 0–5)
Lymphs Abs: 1.7 10*3/uL (ref 0.7–4.0)

## 2010-11-11 LAB — HEPATIC FUNCTION PANEL
AST: 19 U/L (ref 0–37)
Albumin: 3.4 g/dL — ABNORMAL LOW (ref 3.5–5.2)
Total Bilirubin: 0.6 mg/dL (ref 0.3–1.2)
Total Protein: 6.2 g/dL (ref 6.0–8.3)

## 2010-11-11 LAB — LACTIC ACID, PLASMA: Lactic Acid, Venous: 1.8 mmol/L (ref 0.5–2.2)

## 2010-11-11 LAB — AMMONIA: Ammonia: 13 umol/L (ref 11–35)

## 2010-11-11 LAB — POCT CARDIAC MARKERS

## 2010-12-07 LAB — DIFFERENTIAL
Basophils Absolute: 0 10*3/uL (ref 0.0–0.1)
Eosinophils Relative: 2 % (ref 0–5)
Lymphocytes Relative: 24 % (ref 12–46)
Neutrophils Relative %: 63 % (ref 43–77)

## 2010-12-07 LAB — CARDIAC PANEL(CRET KIN+CKTOT+MB+TROPI)
CK, MB: 0.8 ng/mL (ref 0.3–4.0)
CK, MB: 0.8 ng/mL (ref 0.3–4.0)
Relative Index: INVALID (ref 0.0–2.5)
Relative Index: INVALID (ref 0.0–2.5)
Relative Index: INVALID (ref 0.0–2.5)
Total CK: 110 U/L (ref 7–232)
Total CK: 67 U/L (ref 7–232)
Total CK: 96 U/L (ref 7–232)
Troponin I: <0.01 ng/mL (ref 0.00–0.06)

## 2010-12-07 LAB — URINE CULTURE
Colony Count: 9000
Special Requests: NEGATIVE

## 2010-12-07 LAB — COMPREHENSIVE METABOLIC PANEL
AST: 21 U/L (ref 0–37)
BUN: 17 mg/dL (ref 6–23)
CO2: 25 mEq/L (ref 19–32)
Chloride: 104 mEq/L (ref 96–112)
Creatinine, Ser: 1.19 mg/dL (ref 0.4–1.5)
GFR calc Af Amer: >60 mL/min (ref >60)
GFR calc non Af Amer: 58 mL/min — ABNORMAL LOW (ref >60)
Glucose, Bld: 187 mg/dL — ABNORMAL HIGH (ref 70–99)
Total Bilirubin: 0.6 mg/dL (ref 0.3–1.2)

## 2010-12-07 LAB — CBC
HCT: 33.4 % — ABNORMAL LOW (ref 39.0–52.0)
Hemoglobin: 11.3 g/dL — ABNORMAL LOW (ref 13.0–17.0)
MCV: 91 fL (ref 78.0–100.0)
RBC: 3.67 MIL/uL — ABNORMAL LOW (ref 4.22–5.81)
WBC: 3.8 10*3/uL — ABNORMAL LOW (ref 4.0–10.5)

## 2010-12-07 LAB — CULTURE, BLOOD (ROUTINE X 2): Report Status: 3072010

## 2010-12-07 LAB — POCT CARDIAC MARKERS
Troponin i, poc: <0.05 ng/mL (ref 0.00–0.09)
Troponin i, poc: <0.05 ng/mL (ref 0.00–0.09)

## 2010-12-07 LAB — D-DIMER, QUANTITATIVE: D-Dimer, Quant: 1.28 ug/mL-FEU — ABNORMAL HIGH (ref 0.00–0.48)

## 2010-12-12 LAB — COMPREHENSIVE METABOLIC PANEL
ALT: 16 U/L (ref 0–53)
AST: 18 U/L (ref 0–37)
Calcium: 9.3 mg/dL (ref 8.4–10.5)
Creatinine, Ser: 1.25 mg/dL (ref 0.4–1.5)
GFR calc Af Amer: >60 mL/min (ref >60)
GFR calc non Af Amer: 55 mL/min — ABNORMAL LOW (ref >60)
Sodium: 139 mEq/L (ref 135–145)
Total Protein: 6.6 g/dL (ref 6.0–8.3)

## 2010-12-12 LAB — DIFFERENTIAL
Eosinophils Absolute: 0.1 10*3/uL (ref 0.0–0.7)
Eosinophils Relative: 3 % (ref 0–5)
Lymphocytes Relative: 49 % — ABNORMAL HIGH (ref 12–46)
Lymphs Abs: 1.4 10*3/uL (ref 0.7–4.0)
Monocytes Relative: 12 % (ref 3–12)
Neutrophils Relative %: 36 % — ABNORMAL LOW (ref 43–77)

## 2010-12-12 LAB — CBC
MCHC: 34.7 g/dL (ref 30.0–36.0)
MCV: 89.8 fL (ref 78.0–100.0)
RDW: 14.2 % (ref 11.5–15.5)

## 2010-12-12 LAB — IRON AND TIBC
Iron: 59 ug/dL (ref 42–135)
TIBC: 302 ug/dL (ref 215–435)
UIBC: 243 ug/dL

## 2010-12-12 LAB — HEMOCCULT GUIAC POC 1CARD (OFFICE)
Fecal Occult Bld: NEGATIVE
Fecal Occult Bld: NEGATIVE

## 2010-12-12 LAB — FERRITIN: Ferritin: 77 ng/mL (ref 22–322)

## 2010-12-12 LAB — PSA: PSA: 0.73 ng/mL (ref 0.10–4.00)

## 2010-12-18 ENCOUNTER — Ambulatory Visit (HOSPITAL_COMMUNITY)
Admission: RE | Admit: 2010-12-18 | Discharge: 2010-12-18 | Disposition: A | Discharge status: Home / Self Care | Payer: Medicare Other | Source: Ambulatory Visit | Attending: Pulmonary Disease | Admitting: Pulmonary Disease

## 2010-12-18 DIAGNOSIS — R279 Unspecified lack of coordination: Secondary | ICD-10-CM | POA: Insufficient documentation

## 2010-12-18 DIAGNOSIS — R269 Unspecified abnormalities of gait and mobility: Secondary | ICD-10-CM | POA: Insufficient documentation

## 2010-12-18 DIAGNOSIS — G20 Parkinson's disease: Secondary | ICD-10-CM | POA: Insufficient documentation

## 2010-12-18 DIAGNOSIS — IMO0001 Reserved for inherently not codable concepts without codable children: Secondary | ICD-10-CM | POA: Insufficient documentation

## 2010-12-18 DIAGNOSIS — G20A1 Parkinson's disease without dyskinesia, without mention of fluctuations: Secondary | ICD-10-CM | POA: Insufficient documentation

## 2010-12-18 DIAGNOSIS — M6281 Muscle weakness (generalized): Secondary | ICD-10-CM | POA: Insufficient documentation

## 2010-12-18 DIAGNOSIS — R262 Difficulty in walking, not elsewhere classified: Secondary | ICD-10-CM | POA: Insufficient documentation

## 2010-12-20 ENCOUNTER — Ambulatory Visit (HOSPITAL_COMMUNITY)
Admission: RE | Admit: 2010-12-20 | Discharge: 2010-12-20 | Disposition: A | Discharge status: Home / Self Care | Payer: Medicare Other | Source: Ambulatory Visit | Attending: Pulmonary Disease | Admitting: Pulmonary Disease

## 2010-12-25 ENCOUNTER — Ambulatory Visit (HOSPITAL_COMMUNITY)
Admission: RE | Admit: 2010-12-25 | Discharge: 2010-12-25 | Disposition: A | Discharge status: Home / Self Care | Payer: Medicare Other | Source: Ambulatory Visit | Attending: Pulmonary Disease | Admitting: Pulmonary Disease

## 2010-12-27 ENCOUNTER — Ambulatory Visit (HOSPITAL_COMMUNITY)
Admission: RE | Admit: 2010-12-27 | Discharge: 2010-12-27 | Disposition: A | Discharge status: Home / Self Care | Payer: Medicare Other | Source: Ambulatory Visit | Attending: Pulmonary Disease | Admitting: Pulmonary Disease

## 2010-12-27 DIAGNOSIS — M6281 Muscle weakness (generalized): Secondary | ICD-10-CM | POA: Insufficient documentation

## 2010-12-27 DIAGNOSIS — R269 Unspecified abnormalities of gait and mobility: Secondary | ICD-10-CM | POA: Insufficient documentation

## 2010-12-27 DIAGNOSIS — IMO0001 Reserved for inherently not codable concepts without codable children: Secondary | ICD-10-CM | POA: Insufficient documentation

## 2010-12-27 DIAGNOSIS — G20A1 Parkinson's disease without dyskinesia, without mention of fluctuations: Secondary | ICD-10-CM | POA: Insufficient documentation

## 2010-12-27 DIAGNOSIS — R262 Difficulty in walking, not elsewhere classified: Secondary | ICD-10-CM | POA: Insufficient documentation

## 2010-12-27 DIAGNOSIS — R279 Unspecified lack of coordination: Secondary | ICD-10-CM | POA: Insufficient documentation

## 2010-12-27 DIAGNOSIS — G20 Parkinson's disease: Secondary | ICD-10-CM | POA: Insufficient documentation

## 2011-01-01 ENCOUNTER — Ambulatory Visit (HOSPITAL_COMMUNITY): Payer: Medicare Other | Admitting: *Deleted

## 2011-01-01 ENCOUNTER — Ambulatory Visit (HOSPITAL_COMMUNITY)
Admission: RE | Admit: 2011-01-01 | Discharge: 2011-01-01 | Disposition: A | Discharge status: Home / Self Care | Payer: Medicare Other | Source: Ambulatory Visit | Attending: Pulmonary Disease | Admitting: Pulmonary Disease

## 2011-01-03 ENCOUNTER — Ambulatory Visit (HOSPITAL_COMMUNITY)
Admission: RE | Admit: 2011-01-03 | Discharge: 2011-01-03 | Disposition: A | Discharge status: Home / Self Care | Payer: Medicare Other | Source: Ambulatory Visit | Attending: Pulmonary Disease | Admitting: Pulmonary Disease

## 2011-01-03 ENCOUNTER — Ambulatory Visit (HOSPITAL_COMMUNITY): Payer: Medicare Other | Admitting: Physical Therapy

## 2011-01-09 NOTE — Group Therapy Note (Signed)
NAMEJOVIAN, [name]             ACCOUNT NO.:  [id number]   MEDICAL RECORD NO.:  [id number]          PATIENT TYPE:  INP   LOCATION:  IC02                          FACILITY:  [facility]   PHYSICIAN:  Edward L. Juanetta Gosling, M.D.DATE OF BIRTH:  15-Sep-1920   DATE OF PROCEDURE:  DATE OF DISCHARGE:  10/28/2008                                 PROGRESS NOTE   [name] says that he is feeling better.  He has no new complaints.  He has been worked up now with a cardiac stress test, but we do not have  the results of that as yet.  He also had a Doppler of his legs and that  does show that he has some changes suggesting that he might have  peripheral vascular disease as well.  He says that he is not having any  new problems now.  No more chest pain.  No increased shortness of breath  and he feels better.   PHYSICAL EXAMINATION:  Blood pressure is in the 120s, his pulse 80 and  regular.  His chest is clear.  His heart is regular without gallop.  His  abdomen is soft without masses.   My assessment then is that he seems to be doing better.   Plan is that hopefully after we get the results of his stress test back  if it is okay he could be discharged.  One of the problems is his  medication reconciliation form is I think inaccurate.  His family had  brought in medications, but some of medications that they brought in  were as old as 75 or 75 years old and he is not on that particular  regimen.  I am going to try to get him to bring it in before I sent him  home.      Edward L. Juanetta Gosling, M.D.  Electronically Signed     ELH/MEDQ  D:  10/28/2008  T:  10/29/2008  Job:  [id number]

## 2011-01-09 NOTE — Letter (Signed)
Dec 30, 2007    [name], M.D.  [address]  [place], [place] [address]   RE:  [name], [name]  MRN:  [id number]  /  DOB:  15-Sep-1920   Dear Ed:   [name] returns to the office for continued assessment and  treatment of exertional dyspnea.  Since his last visit, he has done  somewhat better.  He still notes fatigue with exercise, but not so much  in the way of breathlessness.  He has had no chest discomfort.   An echocardiogram shows normal prosthetic valve function and normal left  ventricular systolic function.  A BNP level was normal at 58.   MEDICATIONS:  Are unchanged from his last visit.   PHYSICAL EXAMINATION:  GENERAL:  A pleasant gentleman in no acute  distress.  VITAL SIGNS:  Weight is 180, 2 pounds more than in March of this year.  Blood pressure 130/60, heart rate 56 and regular, respirations 18.  NECK:  No jugular venous distention; normal carotid upstrokes without  bruits.  LUNGS:  Clear.  CARDIAC:  Normal first heart sounds; slightly increased pulmonic  component of second heart sounds; modest basilar systolic ejection  murmur.  ABDOMEN:  Soft and nontender; no masses; no organomegaly.  EXTREMITIES:  1/2+ pretibial edema.   A 6-minute walk was performed.  The patient covered 800 feet, which is  fairly good, without limiting symptoms.  O2 saturation remained greater  than 93% throughout.   IMPRESSION:  [name] does not appear to have any serious  cardiopulmonary disease.  The desaturation noted on his treadmill test  was not reproduced on a 6-minute walk.  I would not pursue this further.  I will plan to see this nice gentleman again in 9 months.    Sincerely,      Gerrit Friends. Dietrich Pates, MD, [facility]  Electronically Signed    RMR/MedQ  DD: 12/30/2007  DT: 12/30/2007  Job #: [id number]

## 2011-01-09 NOTE — Consult Note (Signed)
NAMEBRASEN, BUNDREN             ACCOUNT NO.:  [id number]   MEDICAL RECORD NO.:  [id number]          PATIENT TYPE:  INP   LOCATION:  IC02                          FACILITY:  [facility]   PHYSICIAN:  Gerrit Friends. Dietrich Pates, MD, FACCDATE OF BIRTH:  1921-03-17   DATE OF CONSULTATION:  10/26/2008  DATE OF DISCHARGE:                                 CONSULTATION   CARDIOLOGIST:  Gerrit Friends. Dietrich Pates, MD, [facility]   REASON FOR CONSULTATION:  Chest pain.   HISTORY OF PRESENT ILLNESS:  Mr. [name] is an 77 year old male patient  with a history of coronary artery disease status post bypass surgery in  1999 accompanied by tissue aortic valve replacement who was evaluated by  Dr. Dietrich Pates back in March of 2009 for dyspnea.  He had an  echocardiogram at that time that demonstrated normal functioning of his  aortic valve prosthesis and good LV function.  He had an exercise  treadmill test was negative for ischemia.  His O2 saturations did drop  during his exercise treadmill test, but he had a follow-up 6-minute walk  test that did not demonstrate hypoxia.  No further cardiovascular  testing was pursued at that time.  The patient now presents to [facility] with complaints of chest discomfort and profound  diaphoresis occurring this morning.  His niece and sister both died  recently, and their funerals were last week.  He has not felt well over  the last couple of days and did not sleep well last night.  He awoke  around midnight and decided to go back to sleep sitting in a chair.  He  awoke later with profound diaphoresis and also left-sided chest  discomfort.  He also tells me that he felt pain all over.  He denies  any associated nausea, shortness of breath or syncope.  He took aspirin  and decided to come to the emergency room at [facility].  We  are now asked to further evaluate.   PAST MEDICAL HISTORY:  As outlined above.  1. Coronary artery disease status post bypass surgery in  1999.      a.     Cardiac catheterization in December of 2003 - LIMA to the       LAD and vein graft to the diagonal #1 and ramus intermediate       patent; a sequential vein graft to the obtuse marginal and       posterior descending artery with total occlusion of the obtuse       marginal limb.      b.     Myoview in December of 2006 - no ischemia or scar, EF of       68%.  2. Status post bioprosthetic aortic valve replacement in 1999 at the      time of his bypass surgery,  3. Echocardiogram in March of 2009 - EF of 55-60%, mild LVH, mean      aortic valve gradient 7 mmHg, mild mitral irritation, AV not well      visualized.  4. Hypertension.  5. Hyperlipidemia.  6. History of CVA.  7. GERD.  8. History of chronic lymphocytic leukemia.  9. Benign prostatic hypertrophy status post TURP in 2007.  10.Status post tonsillectomy.   MEDICATIONS AT HOME:  1. Simvastatin 80 mg half tablet q.h.s.  2. Omeprazole 20 mg daily.  3. Lisinopril HCTZ 25/20 mg daily.  4. Aspirin 81 mg daily.   ALLERGIES:  NO KNOWN DRUG ALLERGIES.   SOCIAL HISTORY:  The patient lives in [place] with his wife.  He is a  retired Psychologist, forensic.  He smoked cigarettes for 20+ years but  quit many years ago.  Denies alcohol abuse.  He walks on a treadmill  about three times a week.   FAMILY HISTORY:  Insignificant for premature CAD.   REVIEW OF SYSTEMS:  Please see HPI.  Denies fevers, chills, weight  change, sore throat, rash, dysuria, hematuria, nausea, vomiting,  diarrhea, bright red blood per rectum or melena, dysphagia, odynophagia.  He denies dyspnea on exertion, orthopnea, PND.  Denies edema or  palpitations.  Denies syncope or near syncope.  He has had a cough and  URI symptoms for the last week.  He notes no sputum production.  The  rest of the review of systems are negative.   PHYSICAL EXAMINATION:  GENERAL:  He is a well-nourished, well-developed  male in no distress.  VITAL SIGNS:  Blood  pressure is 120/57, pulse 60, respirations 18,  temperature 98, oxygen saturation 100% on room air.  HEENT:  Normal.  NECK:  Without JVD.  LYMPH:  Without lymphadenopathy.  ENDOCRINE:  Without thyromegaly.  CARDIAC:  Normal S1 and S2.  Regular rate and rhythm without murmur.  LUNGS:  Clear to auscultation bilaterally without wheezing, rhonchi or  rales.  SKIN:  Without rash.  ABDOMEN:  Soft, nontender with normoactive bowel sounds.  No  organomegaly.  EXTREMITIES:  With trace edema bilaterally.  MUSCULOSKELETAL:  Without joint deformity.  NEUROLOGIC:  He is alert and oriented x3.  Cranial nerves II-XII grossly  intact.  VASCULAR:  Without carotid bruits bilaterally.   CHEST X-RAY:  Left basilar atelectasis, mild cardiomegaly without edema.   ELECTROCARDIOGRAM:  Sinus rhythm with heart rate of 62, left axis  deviation, T-wave inversions in I and aVL.   LABORATORY DATA:  White count 3800, hemoglobin 11.3, platelet count  104,000.  Sodium 135, potassium 3.7, BUN 17, creatinine 1.19, glucose  187, albumin 3.4.  Cardiac markers negative x2, D-dimer 1.28.   ASSESSMENT:  1. Chest pain and diaphoresis in an 77 year old male with a history of      coronary artery disease status post bypass surgery in 1999, a      nonischemic Myoview study in 2006 and negative exercise treadmill      test in March of 2009 with overall preserved LV function.  2. Status post bioprosthetic aortic valve replacement with normal      functioning valve at echocardiogram in March of 2009.  3. Hypertension.  4. Hyperlipidemia.  5. Chronic lymphocytic leukemia.  6. Thrombocytopenia.  7. Acid reflux disease.   RECOMMENDATIONS:  The patient's presenting symptoms of chest pain are  somewhat atypical.  His D-dimer is somewhat elevated, and a chest CT is  currently pending.  Serial enzymes will be checked, as well as serial  EKGs.  It is been quite some time since his last ischemic workup.  If he  rules out, will  plan on exercise Myoview study in the morning.  Further  recommendations to follow.   Thank you very much for  the consultation.  We will be glad to follow the  patient throughout the remainder of his admission.      Tereso Newcomer, PA-C      Gerrit Friends. Dietrich Pates, MD, [facility]  Electronically Signed    SW/MEDQ  D:  10/26/2008  T:  10/26/2008  Job:  [id number]

## 2011-01-09 NOTE — Discharge Summary (Signed)
NAMEKESHAWN, FIORITO             ACCOUNT NO.:  [id number]   MEDICAL RECORD NO.:  [id number]          PATIENT TYPE:  INP   LOCATION:  IC02                          FACILITY:  [facility]   PHYSICIAN:  Edward L. Juanetta Gosling, M.D.DATE OF BIRTH:  [birth year]   DATE OF ADMISSION:  10/26/2008  DATE OF DISCHARGE:  03/04/2010LH                               DISCHARGE SUMMARY   FINAL DISCHARGE DIAGNOSES:  1. Chest discomfort, myocardial infarction ruled out.  2. Coronary artery occlusive disease.  3. Hypertension.  4. Hyperlipidemia.  5. Chronic lymphocytic leukemia.  6. History of congestive heart failure.  7. Anemia.  8. Previous valve replacement.  9. History of tonsillectomy.  10.History of benign prostatic hypertrophy with transurethral      resection of prostate.  11.Glaucoma.  12.Peripheral vascular disease.   HISTORY:  Mr. [name] is an 77 year old who came to the emergency room  with complaints of chest discomfort.  He said it was in the middle part  of his chest and did not have any radiation, but he had been hurting all  over.  He became quite diaphoretic, developed severe weakness,  diaphoresis, and chest discomfort, and he called for help and was  brought to the emergency room.  Of interest is the fact that he had been  exercising earlier in the day and did not have any discomfort while he  was exercising, but his discomfort occurred while he was lying down.   PHYSICAL EXAMINATION:  GENERAL:  A well-developed, well-nourished male  who did not appear to be in any acute distress.  VITAL SIGNS:  His blood pressure was 128/78, pulse in the 80s, and  respirations 16.  He was wearing oxygen and looked comfortable.  CHEST:  Clear.  HEART:  Regular without gallops.  ABDOMEN:  Soft.   Cardiac markers were negative for infarction.  His white blood count was  3800, hemoglobin 11.3, his BUN was 17, and creatinine 1.19.   HOSPITAL COURSE:  He was admitted, had consultation with [facility]  Cardiology who are his normal cardiologists.  He had cycled cardiac  enzymes which did not show evidence of an infarction.  He had CT angio  of the chest which did not show pulmonary emboli.  He had an arterial  Doppler that showed some progression of peripheral vascular disease in  his right ankle.  He had a myocardial scan with exercise that did not  show any definite ischemia.  By the time of discharge, he was much  improved and was discharged home.  His previous eye drops  which he unfortunately left at home and I do not have the names, he is  going to let me know what they are and simvastatin 80 mg one-half tablet  at bedtime, omeprazole 20 mg daily, lisinopril HCT 20/25 daily, and  aspirin 81 mg daily.      Edward L. Juanetta Gosling, M.D.  Electronically Signed     ELH/MEDQ  D:  11/02/2008  T:  11/03/2008  Job:  [id number]

## 2011-01-09 NOTE — Assessment & Plan Note (Signed)
[facility]                       [place] [facility] OFFICE NOTE   [name], [name]                    MRN:          [id number]  DATE:02/01/2009                            DOB:          05-31-1921    CARDIOLOGIST:  Gerrit Friends. Dietrich Pates, MD, [facility].   PRIMARY CARE PHYSICIAN:  Edward L. Juanetta Gosling, MD   REASON FOR VISIT:  Posthospitalization followup.   HISTORY OF PRESENT ILLNESS:  [name] is an 77 year old male with a  history of coronary disease, status post bypass surgery in 1999  accompanied by tissue aortic valve replacement, who was seen in March  2010 at [facility] secondary to chest pain.  He ruled out for  myocardial infarction by enzymes.  He had an abnormal D-dimer and a  chest CT was negative for pulmonary embolism.  He underwent stress  Myoview study October 27, 2008 and this demonstrated an EF of 65% with a  minimal area of decreased tracer uptake likely of no clinical  significance.  A very small area of ischemia and or scarring could not  be unequivocally excluded.  This was felt to be low-risk.  The patient  returns for followup today.  He denies any recurrent chest pain,  shortness of breath.  Denies syncope or near-syncope.  Denies orthopnea,  PND, or pedal edema.  He continues to note night cramps in his bilateral  extremities.  He also notes some posterior thigh discomfort in his right  leg with exercise.  Of note, we did send him for ABIs during his  hospitalization.  This demonstrated worsening values on the right.  His  ABI was 0.69 and had previously been 0.8.  His left ABI was 0.96.  He  denies any nonhealing ulcers or rest pain in his lower extremities.   CURRENT MEDICATIONS:  1. Aspirin 81 mg daily.  2. Latanoprost eye drops.  3. Simvastatin 40 mg daily.  4. Omeprazole 20 mg daily.  5. Lisinopril HCTZ 20/25 mg daily.  6. Nitroglycerin p.r.n.   PHYSICAL EXAMINATION:  GENERAL:  He is a well-nourished,  well-developed  male.  VITAL SIGNS:  Blood pressure is 118/56, pulse is 64, weight 176 pounds.  HEENT:  Normal.  NECK:  Without JVD.  CARDIAC:  Regular rate and rhythm.  LUNGS:  Lungs with expiratory rhonchi bilaterally.  No rales.  ABDOMEN:  Soft, nontender.  EXTREMITIES:  No edema.  NEUROLOGIC:  He is alert and oriented x3.  Cranial nerves II through XII  grossly intact.  SKIN:  Warm and dry.  VASCULAR:  Femoral artery pulses are difficult to palpate bilaterally.  No bruits are auscultated.  Dorsalis pedis and posterior tibialis pulses  are diminished bilaterally and are difficult to palpate.   ASSESSMENT AND PLAN:  1. Peripheral arterial disease.  He has abnormal ABIs especially on      the right and he has symptoms that are somewhat consistent with      intermittent claudication.  I have suggested that he see either Dr.      Excell Seltzer or Dr. Clifton James in [place] for further evaluation and  recommendations.  We will have him see either Dr. Excell Seltzer or Dr.      Clifton James in the next 1-2 months.  2. Coronary artery disease.  As outlined above, he had a recent      admission to [facility].  He had a fairly reassuring      Myoview study.  He had bypass surgery in 1999 and his cardiac      catheterization in December 2003 demonstrated patent LIMA to the      LAD, patent vein graft to the diagonal 1 and patent vein graft to      the ramus intermediate.  His vein graft to the obtuse marginal and      PDA had a total occlusion of the obtuse marginal limb.  He has had      overall preserved LV function.  He is not having any further chest      pain.  He will continue on aspirin.  No further workup is warranted      at this time.  3. Status post bioprosthetic aortic valve replacement in 1999.  As      noted previously, followup echocardiography in March 2009      demonstrated normal functioning aortic valve prosthesis.  4. Hyperlipidemia.  He had LFTs which were normal while he  was in the      hospital on March 2010.  He will followup lipids to reassess his      lipid profile.  5. Hypertension.  This is well-controlled.   DISPOSITION:  As noted above.  The patient will be referred to one of  our peripheral vascular specialists for further evaluation of his PAD  and intermittent claudication.  He will be brought back in follow up  with Dr. Dietrich Pates in the next 4 months or sooner p.r.n.      Corey Newcomer, PA-C  Electronically Signed      Gerrit Friends. Dietrich Pates, MD, [facility]  Electronically Signed   SW/MedQ  DD: 02/01/2009  DT: 02/02/2009  Job #: [id number]   cc:   [name], M.D.

## 2011-01-09 NOTE — Letter (Signed)
November 12, 2007    Edward L. Juanetta Gosling, M.D.  [address]  [place], [place] [address]   RE:  [name], [name]  MRN:  [id number]  /  DOB:  [birth year]   Dear Ed:   [name] returns to the office for continued assessment and  treatment of exertional dyspnea with a history of coronary disease.  Since his last visit he has been unchanged in terms of his  symptomatology.  He has dyspnea with mild to moderate exertion.  He has  had no chest discomfort.  He does experience some leg pain with walking  that he has been told is related to poor circulation.  Medications are  unchanged from his last visit.   GENERAL:  On exam, a pleasant gentleman in no acute distress.  VITAL SIGNS:  The blood pressure is 140/70, heart rate 70 and regular,  respirations 14.  NECK:  No jugular venous distention; no carotid bruits.  CARDIOVASCULAR:  Prosthetic first and second heart sounds; fourth heart  sound present.  Minimal, if any, systolic ejection murmur.  LUNGS:  Few inspiratory and expiratory rhonchi.  ABDOMEN:  Soft and nontender; no organomegaly.  EXTREMITIES:  No edema; decreased distal pulses.  With a Doppler device,  the right dorsalis pedis is unobtainable; there is a minimal left  dorsalis pedis.  The posterior tibials are present bilaterally but are  monophasic.   A stress test was performed.  The patient developed limiting dyspnea and  some leg fatigue without definite claudication.  There were no  diagnostic EKG abnormalities and no angina.  The heart rate response to  exercise was good.  Exercise tolerance was somewhat impaired, but not  dramatically so.  There was substantial desaturation by oximetry during  exercise with values falling from 98% at rest to approximately 80% at  peak exercise.   The patient's PFTs from 2007 were reviewed.  There was small airway  disease but no dramatic abnormalities.  Room air arterial blood gas was  normal.  Likewise, a recent chest x-ray was  normal.   IMPRESSION:  [name] does not appear to have exertional dyspnea or  exercise limitation related to cardiac issues.  His oxygen desaturation  during exercise is of some concern.  Perhaps a cardiopulmonary stress  test would be useful - I will certainly leave any further pulmonary  assessment to your discretion.  A CT scan of the chest might also be  helpful.  An echocardiogram is pending.  I will obtain formal ABIs to  document the extent of peripheral vascular disease but he does not  appear to have symptoms to warrant intervention at the present time and  we will reassess this nice gentleman again in 1 month.    Sincerely,      Gerrit Friends. Dietrich Pates, MD, [facility]  Electronically Signed    RMR/MedQ  DD: 11/12/2007  DT: 11/12/2007  Job #: [id number]

## 2011-01-09 NOTE — Procedures (Signed)
[facility]                              EXERCISE TREADMILL   MADS, BORGMEYER                    MRN:          [id number]  DATE:11/12/2007                            DOB:          03-Apr-1921    REFERRING PHYSICIAN:  [name], M.D.   GRADED EXERCISE TEST:  1. Treadmill exercise performed to a work load of 7 METs and a heart      rate of 125, 91% of age-predicted maximum.  Exercise discontinued      due to dyspnea and leg fatigue; no chest discomfort reported.  2. Blood pressure increased from a resting value of 140/65 to 180/70      early in recovery, a normal response.  3. Occasional PVC and one PVC pair occurred during exercise.  4. Baseline EKG:  Normal sinus rhythm; intra-atrial conduction delay;      borderline first-degree AV block; delayed R-wave progression -      cannot exclude prior septal myocardial infarction; nonspecific STT-      wave abnormality.   STRESS EKG:  1. There is 1-1.5 mm of up-sloping ST-segment depression in the      inferior leads, as well as V6; less than 1 mm of up-sloping and      flat ST-segment depression in recovery.  2. Oxygen saturation was monitored throughout the study.  Values of      approximately 98% were recorded at rest.  With exercise, there was      a progressive decrease down to values in the low 80s.  There was      rapid recovery back to the high 90s after exercise was      discontinued.   IMPRESSION:  Negative graded exercise test for myocardial ischemia with  no angina reported and no significant ST-segment depression identified.  There was somewhat impaired exercise capacity.  Arterial desaturation  occurred with exertion.     Gerrit Friends. Dietrich Pates, MD, [facility]  Electronically Signed    RMR/MedQ  DD: 11/12/2007  DT: 11/12/2007  Job #: [id number]

## 2011-01-09 NOTE — Letter (Signed)
October 30, 2007    [name], M.D.  [address]  [place], [place] [address]   RE:  [name], [name]  MRN:  [id number]  /  DOB:  1920-12-20   Dear Ed:   [name] returns to the office after a 3-year hiatus.  He previously  had been evaluated for exertional dyspnea, which was fairly subtle.  He  had a stress nuclear study and echocardiogram, which were both  unremarkable.  He now returns with similar symptoms.  He cannot walk  quite as far on his treadmill at home without some dyspnea.  He has  absolutely no chest discomfort, tightness, nor pressure.  Symptoms  resolved over approximately 10 minutes of rest.  Otherwise, he has done  generally well.  There is no history of lung disease.  He has very  modest cigarette consumption that occurred more than 40 years ago.   Current medications include lisinopril 40 mg daily, hydrochlorothiazide  25 mg daily, aspirin 81 mg daily, simvastatin 40 mg daily, omeprazole 20  mg daily.   [name] was seen in the emergency department approximately 5 weeks  ago for dizziness.  This occurred when he arose at night to urinate.  He  did not fall or lose consciousness.  Chest x-ray at that time was  negative.  He had laboratory studies just a week ago that showed a  minimal anemia with hemoglobin 11.8 and a normal MCV, minimal  thrombocytopenia with platelet count of 137,000, normal electrolytes,  normal renal function, normal hepatic function and good lipids.   EXAM:  Pleasant gentleman in no acute distress.  The weight is 178, 4 pounds less than in March 2006.  Blood pressure  100/55, heart rate 60 and regular, respirations 18.  HEENT:  Injected conjunctivae; bilateral arcus.  NECK:  No jugular venous distention; normal carotid upstrokes without  bruits.  LUNGS:  Slight inspiratory and expiratory rhonchi; no rales; no  wheezing.  CARDIAC:  Normal first and second heart sounds; fourth heart sound  present.  Modest systolic ejection  murmur.  ABDOMEN:  Soft and nontender; no organomegaly.  EXTREMITIES:  No edema; distal pulses intact.   EKG obtained in the emergency department shows sinus bradycardia;  indeterminate axis; first-degree AV block; possible prior septal  myocardial infarction; IVCD and minor nonspecific T-wave abnormality;  there is delayed R-wave progression.   IMPRESSION:  [name] appears to be about stable.  Since he has a  prosthetic valve in place and is reporting some dyspnea, an  echocardiogram will be obtained.  I doubt that he has prosthetic valve  dysfunction.  A BNP level will also be checked, but I do not think he  has congestive heart failure.  I would be most concerned about sinus  node dysfunction and chronotropic incompetence.  We will proceed with a  standard treadmill test to evaluate his heart rate response to exercise.  I will reassess this nice gentleman after the above testing has been  completed.  We will check orthostatic blood pressures as well.    Sincerely,      Gerrit Friends. Dietrich Pates, MD, [facility]  Electronically Signed    RMR/MedQ  DD: 10/30/2007  DT: 10/30/2007  Job #: [id number]

## 2011-01-09 NOTE — H&P (Signed)
NAMEJOHNEY, PEROTTI             ACCOUNT NO.:  [id number]   MEDICAL RECORD NO.:  [id number]          PATIENT TYPE:  INP   LOCATION:  IC02                          FACILITY:  [facility]   PHYSICIAN:  Edward L. Juanetta Gosling, M.D.DATE OF BIRTH:  04-19-1921   DATE OF ADMISSION:  10/26/2008  DATE OF DISCHARGE:  LH                              HISTORY & PHYSICAL   [name] is an 77 year old who came to the emergency room with chest  discomfort.  He said that it was in the middle part of his chest, it did  not have any radiation.  But, he also says that he had been hurting all  over.  He did, however, become quite diaphoretic and eventually because  of severe weakness, diaphoresis and this chest discomfort, he called  for help and was brought to the emergency room.  He said he had been  exercising some earlier and did not have any chest discomfort while he  was exercising.  This actually occurred while he was lying down.  He  said he did not sleep well.  He had no radiation of the pain.  He says  he is now pain free and feels much better.   PAST MEDICAL HISTORY:  Positive for coronary disease, hypertension,  hyperlipidemia, chronic lymphocytic leukemia.  He has had some symptoms  of congestive heart failure.  He has been mildly anemic because of his  chronic lymphocytic leukemia.  He has had a valve replacement.  Surgically he has had a tonsillectomy, prostate surgery, has had valve  replacement.  His daughter has brought in some medications, but I do not  think these are the medications that he is taking regularly.  Actually,  one of these is from about 6-7 years ago.  He is apparently taking  Flomax 0.4, the rest of his medications are unclear.  He has been on  lisinopril in the past.  He has been on omeprazole in the past,  simvastatin in the past, Zetia in the past.  He is on a number of eye  drops.  He takes an aspirin daily, but is not sure if he is taking all  that now or not.   PHYSICAL EXAMINATION:  Shows a well-developed, well-nourished male who  does not appear to be in any acute distress at this point.  His blood  pressure 120/78, pulse is in the 80s.  His respirations are about 16.  He is wearing oxygen, but looks comfortable.  His mucous membranes are  slightly dry.  His tympanic membranes are intact.  His pupils are  reactive.  I did not check a funduscopic examination at this time.  NECK:  Supple.  He does not have any jugular venous distention.  CHEST:  Is fairly clear without any wheezing.  HEART:  Regular without gallop.  ABDOMEN:  Soft, no masses are felt.  EXTREMITIES:  Show trace if any edema.   LABORATORY WORK:  Cardiac markers point of care x2 are negative.  White  count 3800, hemoglobin 11.3, metabolic profile shows his BUN 17,  creatinine 1.19, and his albumin is 3.4.  He has recently had 6 fecal  occult bloods, 1 of which was positive.   ASSESSMENT:  He has had some chest discomfort.  He does have a history  of cardiac disease with previous stent placement.  He has valvular heart  disease as well.  He has had a history of congestive heart failure,  although last echocardiogram showed normal systolic function.  He has  probably mild chronic renal failure.  He has a history of  hypertension.  It is not clear if he is on any antihypertensives.  He  has a history of benign prostatic hypertrophy and he is on Flomax now.  He takes multiple eye drops.   PLAN:  To cycle his cardiac enzymes and EKGs, have him get a cardiology  consultation and follow.      Edward L. Juanetta Gosling, M.D.  Electronically Signed     ELH/MEDQ  D:  10/26/2008  T:  10/26/2008  Job:  [id number]

## 2011-01-09 NOTE — Group Therapy Note (Signed)
Levine, Corey             ACCOUNT NO.:  [id number]   MEDICAL RECORD NO.:  [id number]          PATIENT TYPE:  INP   LOCATION:  IC02                          FACILITY:  [facility]   PHYSICIAN:  Edward L. Juanetta Gosling, M.D.DATE OF BIRTH:  Apr 17, 1921   DATE OF PROCEDURE:  DATE OF DISCHARGE:                                 PROGRESS NOTE   Mr. [name] says that he is feeling better.  He is coughing a little  bit, but he has not had anymore chest discomfort.   PHYSICAL EXAMINATION:  His temperature is 100.2, pulse is 56,  respirations 16, blood pressure 108/56, O2 sat is 96% on 2 L.  CHEST:  Actually fairly clear despite his cough.  HEART:  Regular with mild bradycardia.  ABDOMEN:  Soft.   ASSESSMENT:  Overall, I think he is about the same.  He is ruled out for  myocardial infarction.  Because of his fever and cough, I am going to go  ahead and start him on antibiotics and get another chest x-ray today and  he is safe, I think for a stress test since he is ruled out for  myocardial infarction.      Edward L. Juanetta Gosling, M.D.  Electronically Signed     ELH/MEDQ  D:  10/27/2008  T:  10/27/2008  Job:  [id number]

## 2011-01-12 NOTE — Procedures (Signed)
NAMEACHILLIES, BUEHL             ACCOUNT NO.:  [id number]   MEDICAL RECORD NO.:  [id number]          PATIENT TYPE:  OUT   LOCATION:  RAD                           FACILITY:  [facility]   PHYSICIAN:  Prospect Bing, M.D. [facility] OF BIRTH:  1920-10-24   DATE OF PROCEDURE:  12/10/2005  DATE OF DISCHARGE:                                  ECHOCARDIOGRAM   CLINICAL DATA:  An 77 year old gentleman with prior CABG surgery and aortic  valve replacement surgery; now with increasing dyspnea.  M-mode aorta 2.6,  left atrium 4.7, septum 1.6, posterior wall 1.4, LV diastole 4.1, LV systole  3.1.  1.  Technically suboptimal but adequate echocardiographic study.  2.  Mild left atrial enlargement; normal right atrial size; normal right      ventricular size and function; mild RVH.  3.  There is considerable thickening in the aortic root.  Aortic valve      leaflets are thickened, as well.  There appears to be mild impairment in      leaflet separation, but hemodynamics are normal for bioprosthetic valve.      This appears to be a stentless device.  4.  Mild calcification of the base of the anterior leaflet of the mitral      valve; moderate annular calcification; slight, if any, stenosis.  5.  Pulmonic valve and proximal pulmonary artery not well imaged; they      appear grossly normal.  6.  Normal left ventricular size; moderate hypertrophy; not all segments      adequately evaluated; overall LV systolic function appears normal.  7.  Normal IVC.  8.  Comparison with prior study of August 15, 2005:  No significant      interval change.      Anniston Bing, M.D. [facility]  Electronically Signed     RR/MEDQ  D:  12/11/2005  T:  12/11/2005  Job:  [id number]

## 2011-01-12 NOTE — Discharge Summary (Signed)
NAME:  LEVON, BOETTCHER                       ACCOUNT NO.:  [id number]   MEDICAL RECORD NO.:  [id number]                   PATIENT TYPE:  INP   LOCATION:  2025                                 FACILITY:  [facility]   PHYSICIAN:  Learta Codding, M.D. [facility]             DATE OF BIRTH:  06-25-1921   DATE OF ADMISSION:  08/25/2002  DATE OF DISCHARGE:  08/27/2002                           DISCHARGE SUMMARY - REFERRING   SUMMARY OF HISTORY:  Mr. Morones is a 77 year old black male who is  followed routinely by Dr. Daleen Squibb and Dr. Juanetta Gosling.  He usually goes to the [facility]  to exercise and after his workout on December 30, he developed a vibrating  sensation in his face and he was worried he was having a stroke.  He then  developed chest discomfort relieved with nitroglycerin.  However, after  presentation to [facility] Emergency Room, he developed reoccurring chest  discomfort and was transferred to [facility].  On transfer, he was pain-free  without vestibular symptoms.  His history is notable for bypass surgery in  1999 with a porcine aortic valve.  He has also had problems with  postoperative atrial fibrillation.  He had a subsequent angioplasty of the  native RCA and in 2002 stents to his circumflex.  He has a known occluded  graft to the OM1 and a history of prior mini strokes.   LABORATORY DATA:  Fasting lipids showed a total cholesterol of 221,  triglycerides 48, HDL 73, LDL 138.  CKs and troponins x2 were negative for  myocardial infarction.  Admission sodium was 137, potassium 4.2, BUN 17,  creatinine 1.2, glucose 170.  PTT was 120.  On admission H&H was 13.6 and  41.3, normal indices, platelets 160, WBC is 2.6.   EKG:  Normal sinus rhythm, nonspecific ST-T wave changes.   HOSPITAL COURSE:  Mr. Mabey was transferred to our facility from [facility] for further evaluation.  He was placed on IV Heparin.  Overnight, he did not have any further chest discomfort.  Enzymes and EKGs  were negative for myocardial infarction.  He underwent cardiac  catheterization on August 26, 2002, by Dr. Andee Lineman.   According to Dr. Margarita Mail progress note, he has a 20-30% proximal RCA, 90%  distal RCA and was felt to be nondominant, the LIMA to the LAD was patent.  The saphenous vein graft to the diagonal 1 and the ramus was patent, the  saphenous vein graft to the first OM was patent but the sequential part to  the PDA was patent.  The proximal stent and circumflex was patent.  There  was a 30-40% re-stenosis in the distal circumflex.  EF was 65%.  Dr. Andee Lineman  noted that he will add Imdur 60 mg q.d.  Anticipate discharge; however,  consider Plavix if he continues to have symptoms.   Post-procedure remained on bed rest.  He was ambulating without difficulty  on  August 27, 2002.  After Dr. Andee Lineman reviewed, he felt that the patient  could be discharged home with continued medical treatment.  He also  reiterates the history of bradycardia secondary to beta blockers.   DISCHARGE DIAGNOSES:  1. Chest discomfort of undetermined etiology.  2. Progressive coronary artery disease as previously described, continue     medical treatment.  3. Hyperglycemia on transfer.  4. Hyperlipidemia.  5. History as previously.   DISCHARGE MEDICATIONS:  He is discharged home on:  1. Imdur 60 mg q.d.  2. Coated aspirin 81 mg q.d.  3. Zocor 20 mg q.h.s.  4. Aciphex 20 mg q.d.  5. HCTZ 25 q.d.  6. Nitroglycerin 0.4 p.r.n.  7. Atenolol 25 q.d.  8. He was asked to continue his eye drops.   DISPOSITION:  He was advised no lifting, driving, sexual activity, or heavy  exertion for two days.  Maintain low salt, fat, cholesterol diet.  If he has  any problems with his catheterization site, he was asked to call us  immediately.  He was given permission to return to his usual exercise  activities at the gym on Monday.  He was asked on Friday to please call Dr.  Vern Claude office to arrange a follow up  appointment.  At that appointment,  review of his lipid profile should be performed and consideration of  increasing his Zocor or adding another agent for his elevated total  cholesterol and elevated LDL.  Consideration should also be given to  checking a hemoglobin-A1c since his transfer glucose was elevated.     Joellyn Rued, P.A. [facility]                    Learta Codding, M.D. [facility]    EW/MEDQ  D:  08/27/2002  T:  08/27/2002  Job:  [id number]   cc:   Thomas C. Wall, M.D. [facility]  [address]  [place]  [place] [address]  Fax: 1   Oneal Deputy. Juanetta Gosling, M.D.  [address]  [place]  [place] [address]  Fax: [phone]

## 2011-01-12 NOTE — H&P (Signed)
NAME:  [name], [name]                       ACCOUNT NO.:  [id number]   MEDICAL RECORD NO.:  [id number]                   PATIENT TYPE:  INP   LOCATION:  2025                                 FACILITY:  [facility]   PHYSICIAN:  Charlton Haws, M.D. [facility]              DATE OF BIRTH:  Jan 06, 1921   DATE OF ADMISSION:  08/25/2002  DATE OF DISCHARGE:                                HISTORY & PHYSICAL   REASON FOR ADMISSION:  Chest pain.   HISTORY OF PRESENT ILLNESS:  [name] is a 77 year old patient of Dr.  Daleen Squibb and Dr. Juanetta Gosling.  He has an extensive coronary history.  He had  coronary artery bypass graft in 1999 with porcine aortic valve.  He had  postoperative atrial fibrillation.  He has a history of subsequent  angioplasty of the native right coronary artery in 2000 and two stents to  the native circumflex subsequently.  He had an occluded graft to the OM1.  He also had a history of previous mini strokes.   The patient does get bradycardia with beta blockers.   He went to the [facility] today to exercise and after his workout he had vibrating  sensation in his face.  He was worried he was having a stroke.  He then  developed chest pain, relieved with nitroglycerin.  He had recurrent chest  pain in the emergency room and was transferred to [facility].  He  is currently pain free and without vestibular symptoms.   He is a retired Engineer, site.  He has been married for 54 years with 3  children.  He does not smoke tobacco and has rare ETOH abuse.  He lives in  [place].   He is allergic to shellfish and has been premedicated for his  catheterizations in the past.  He has p.r.n. nitroglycerin, aspirin, Zocor  20 q.d., Prilosec 20 q.d., hydrochlorothiazide 25 q.d.   As indicated previously, he gets bradycardic with beta blockers.   PHYSICAL EXAMINATION:  GENERAL:  He is a somewhat elderly-appearing  individual.  VITAL SIGNS:  Blood pressure was 130/60, pulse 60 and regular.  LUNGS:  Sounds clear, no expiratory rhonchi.  NECK:  Carotids normal.  CARDIAC:  No S1or S2.  There is a systolic ejection murmur.  ABDOMEN:  Benign.  EXTREMITIES:  Intact pulses, no edema.   LABORATORY DATA:  His platelet count is 149,000.  His INR is 0.9.  His  creatinine is 1.2.  CPKs and troponins are negative.   IMPRESSION:  Recurrent chest pain, coronary artery bypass graft in 1999 with  porcine aortic valve and recurrent interventions post coronary artery bypass  graft.  The patient needs followup catheterization.  He will be pretreated  for his shellfish allergy with prednisone and Benadryl.  Further  recommendations will be based on the results of his heart catheterization.   His aortic valve sounds like it is functioning normal.  There is  no aortic  insufficiency and no evidence of subacute bacterial endocarditis.   We will not start him on beta blockers now.  He will be given increasing  nitrates while he is here in the hospital and placed on IV heparin.                                                 Charlton Haws, M.D. [facility]    PN/MEDQ  D:  08/25/2002  T:  08/25/2002  Job:  [id number]

## 2011-01-12 NOTE — H&P (Signed)
[name], [name] NO.:  [id number]   MEDICAL RECORD NO.:  [id number]          PATIENT TYPE:  EMS   LOCATION:  ED                            [facility]  [facility]   PHYSICIAN:  Melvyn Novas, MDDATE OF BIRTH:  20-Mar-1921   DATE OF ADMISSION:  08/12/2005  DATE OF DISCHARGE:  LH                                HISTORY & PHYSICAL   HISTORY:  Patient is an 77 year old black male, retired Psychologist, forensic,  patient of Dr. Juanetta Gosling, who is status post CABG x5 in 1999 with a subsequent  stent to his saphenous vein graft in 2005.  Patient took a nap today, awoke  and he had episodes of diaphoresis associated with nausea and dyspnea.  He  specifically denied anginal-type chest pain, however.  This feeling  persisted for almost an hour and he felt totally washed out and weak.  He  presented to the ER and most of his symptoms had subsided but due to his  history of coronary disease and the restenosis he is admitted as a possible  ischemic anginal equivalent for the above symptoms.  His cardiac enzymes  were negative at the time of admission.  He will be placed on IV  nitroglycerin, aspirin, and anticoagulation.   PAST MEDICAL HISTORY:  CVA, coronary artery disease, hypertension, leukemia  unknown type, and acid reflux.   PAST SURGICAL HISTORY:  CABG x5 in 1999, one stent 1-2 years ago to  saphenous vein graft, tonsillectomy, and status post porcine aortic valve  replacement.   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   CURRENT MEDICINES:  1.  Aspirin 81 mg per day.  2.  Prilosec 20 a day.  3.  Zocor 40 a day.  4.  Lisinopril 40 a day.  5.  Hydrochlorothiazide 25.   PHYSICAL EXAMINATION:  VITAL SIGNS:  Blood pressure is 137/73, temperature  is 97.8, pulse 61 and regular, respiratory rate is 20.  HEAD:  Normocephalic, atraumatic.  EYES:  PERRLA.  Extraocular  movements intact.  Sclerae clear.  Conjunctivae  pink.  THROAT:  No erythema, no exudates.  NECK:  No JVD at 45 degrees angulation, no carotid bruits, no thyromegaly,  no thyroid bruits.  LUNGS:  Clear to A&P with diminished breath sounds at the bases.  No rales,  wheezes, or rhonchi appreciable.  HEART:  Regular rhythm, 1/6 aortic outflow murmur, no S3 or S4 gallops.  No  heaves, thrills, or rubs.  ABDOMEN:  Soft, nontender, bowel sounds are normoactive.  No guarding, no  rebound, no mass, no megaly.  EXTREMITIES:  Trace to 1+ pedal edema right greater than left.  Peripheral  pulses 1+ and intact bilaterally.  NEUROLOGIC:  Cranial nerves II-XII grossly intact.  Patient is very astute  mentally.  No focal neurologic deficits.   IMPRESSION:  1.  Anginal ischemic equivalent with nausea, diaphoresis and dyspnea.  2.  Status post coronary artery bypass graft x5 in 1999.  3.  Status post stent to saphenous vein  graft in 2005.  4.  Hypertension.  5.  Hyperlipidemia.   PLAN:  Plan is to put him in ICU, place him on IV nitroglycerin, Lovenox 1  mg/kg q.12 h., aspirin and serial cardiac enzymes.  I will make further  recommendations as the database expands.      Melvyn Novas, MD  Electronically Signed     RMD/MEDQ  D:  08/12/2005  T:  08/12/2005  Job:  [id number]

## 2011-01-12 NOTE — Consult Note (Signed)
[facility]  Patient:    [name], [name] Visit Number: [id number] MRN: [id number]          Service Type: OUT Location: RAD Attending Physician:  Nelta Numbers Dictated by:   Sumter Bing, M.D. Proc. Date: 02/16/02 Admit Date:  02/17/2002                            Consultation Report  REFERRING PHYSICIAN:  Kari Baars, M.D.  PRIMARY CARDIOLOGIST:  Valera Castle, M.D.  HISTORY OF PRESENT ILLNESS:  A 77 year old gentleman with known coronary artery disease and prior CABG surgery admitted with vertigo and bradycardia. Mr. Shin was recently evaluated in the office for a scheduled visit and was well at that time.  An Adenosine stress Cardiolite study was scheduled for today on an elective basis, but was not performed due to the patients hospital admission.  Mr. Linford describes the sudden onset of vertigo and nausea.  He subsequently lay down, but had recurrent symptoms whenever he opened his eyes.  There was an episode of emesis.  He also noted diaphoresis, but no dyspnea.  He was seen in the emergency department where heart rate was 43 but blood pressure was normal.  His symptoms gradually subsided over the next 24 hours.  He has had similar episodes in the past.  He denies any chest discomfort.  Cardiac history is notable for CABG surgery in August 1999 with concomitant bioprosthetic AVR.  His postoperative course was only marked by atrial fibrillation.  LV systolic function was normal.  He returned in June 2000 with recurrent angina and underwent PTCA of the RCA.  All grafts were patent at that time and LV systolic function remained normal.  There is a history of hypertension that has been well controlled.  He has hyperlipidemia.  Other medical problems include chronic lymphocytic leukemia, glaucoma, and GERD.  MEDICATIONS ON ADMISSION: 1. Simvastatin 20 mg q.d. 2. HCTZ 25 mg q.d. 3. Lisinopril 40 mg q.d. 4. Atenolol  25 mg q.d. 5. Aciphex 20 mg q.d. 6. Xalatan eye drops NTG p.r.n. 7. Aspirin 325 mg q.d. 8. Betoptic eye drops.  SOCIAL HISTORY:  Retired from work as a Engineer, site.  No tobacco products used in the past 25 years.  FAMILY HISTORY:  Negative for coronary disease.  REVIEW OF SYSTEMS:  Notable for mild diffuse arthralgias.  Other systems negative.  PHYSICAL EXAMINATION  GENERAL:  Pleasant well-appearing gentleman sitting up and dressed in no acute distress.  VITAL SIGNS:  Blood pressure 150/80, heart rate 60 and regular, respirations 16.  HEENT:  Anicteric sclerae.  NECK:  No bruits.  LUNGS:  Clear.  CARDIAC:  Modest systolic ejection murmur.  EXTREMITIES:  No edema.  SKIN:  No significant lesions.  LABORATORIES:  Chest x-ray:  Postoperative changes, mild vascular redistribution.  EKG:  Marked sinus bradycardia, slightly delayed R-wave progression, low voltage in the limb leads, rightward axis, nonspecific ST segment abnormality.  IMPRESSION:  Mr. Proch symptoms and the course of resolution is most consistent with a vestibular syndrome.  A central event could not be unequivocably excluded but his initial CT scan and neurologic examination were normal.  I doubt any acute cardiac cause.  He did have substantial bradycardia on admission but this probably did not cause his symptoms.  I agree with discontinuation of atenolol.  His stress Cardiolite study will be changed to an exercise test and rescheduled.  If no additional in-hospital evaluation is  required for his initial presenting symptoms, we will plan to have the patient go home today and perform his test as an outpatient.  Thanks very much for requesting our assistance in managing this nice gentleman in hospital. Dictated by:   Salida Bing, M.D. Attending Physician:  Nelta Numbers DD:  02/16/02 TD:  02/17/02 Job: [id number]

## 2011-01-12 NOTE — H&P (Signed)
NAME:  [name], [name]             ACCOUNT NO.:  [id number]   MEDICAL RECORD NO.:  [id number]           PATIENT TYPE:  AMB   LOCATION:  DAY                           FACILITY:  [facility]   PHYSICIAN:  Ky Barban, M.D.DATE OF BIRTH:  1921-07-05   DATE OF ADMISSION:  DATE OF DISCHARGE:  LH                                HISTORY & PHYSICAL   CHIEF COMPLAINT:  Symptoms of prostatism.   HISTORY:  I have been following this patient for many years and about 10  years ago he was treated for BPH symptoms with __________.  He did well. I  did not seem him for several years, now he comes back. His urinary stream is  very slow, it takes him a long time.  His peak flow rate is only 6 mL per  second and he had about 94 mL of residual urine.  Cystoscopy shows an  enlarged prostate with a bladder neck obstruction, but his __________  symptoms, the way he describes is only 3.  But talking to him that he is  quite symptomatic, so I have recommended that we go ahead and treat him with  TUR of the prostate for which he is coming as an outpatient.   The procedure limitations and complications discussed with the patient and  his daughter.   PAST MEDICAL HISTORY:  Refer to his old record.   PERSONAL HISTORY:  He does not smoke or drink   REVIEW OF SYSTEMS:  Unremarkable.   PHYSICAL EXAMINATION:  VITAL SIGNS:  Blood pressure is 121/59, temperature  is 97.9.  HEAD AND NECK:  Eyes ENT negative.  CHEST:  Clear.  HEART:  Regular sinus rhythm.  No murmurs.  ABDOMEN:  Soft and flat.  Liver, spleen, and kidneys are not palpable.  No  CVA tenderness.  GENITOURINARY:  External genitalia is an uncircumcised male.  His testicles  are normal.  RECTAL EXAM:  Prostate is 2+ smooth and firm.   IMPRESSION:  Benign prostatic hypertrophy causing bladder neck obstruction.   PLAN:  TUR of prostate under general anesthesia.  It should be noted that he  has a trilobar hypertrophy with a large median lobe.  I think  that he will  benefit better with TUR prostate.     Ky Barban, M.D.  Electronically Signed    MIJ/MEDQ  D:  09/13/2005  T:  09/13/2005  Job:  [id number]

## 2011-01-12 NOTE — Op Note (Signed)
[facility]  Patient:    [name], [name] Visit Number: [id number] MRN: [id number]          Service Type: END Location: DAY Attending Physician:  Jonathon Bellows Dictated by:   Roetta Sessions, M.D. Proc. Date: 09/30/01 Admit Date:  09/30/2001   CC:         Valera Castle, M.D.  Glenford Peers, M.D.  Kari Baars, M.D.   Operative Report  PROCEDURE:  Diagnostic colonoscopy.  INDICATIONS FOR PROCEDURE:  The patient is a 77 year old gentleman with well-controlled gastroesophageal reflux disease.  He was found to have two out of three mail-in hemoccult cards positive.  He has not had any blood or melena, no change in bowel habits.  He had a colonoscopy in 1997.  No significant lesions were found.  Colonoscopy is now being done to further evaluate hemoccult-positive stools.  Notable through my office August 04, 2001, CBC revealed a slightly depressed white count of 3.68, hemoglobin 13.8, hematocrit 42.9, MCV 93.  This approach has been discussed with [name] previously in my office and again at the bedside.  The potential risks, benefits and alternatives have been reviewed and all questions answered.  He is agreeable.  Please see my dictated H&P for more information.  DESCRIPTION OF PROCEDURE:  Oxygen saturation, blood pressure, pulse and respirations were monitored throughout the entire procedure.  Because of his history of aortic stenosis, he received ampicillin 2 g IV and gentamicin 50 mg IV prior to the procedure.  Conscious sedation:  Versed 2 mg IV, Demerol 50 mg IV in divided doses.  Instrument:  Olympus videochip colonoscope.  FINDINGS:  Digital rectal examination revealed no abnormalities.  ENDOSCOPIC FINDINGS:  Prep was good.  RECTAL:  Examination of the rectal mucosa including retroflexed view  of the anal verge revealed some friable anal canal mucosa, but no discrete hemorrhoid or other lesion was seen.  Rectal mucosa  otherwise appeared normal.  COLON:  The colonic mucosa was surveyed from the rectosigmoid junction through the left, transverse and right colon to the area appendiceal orifice, ileocecal valve and cecum.  These structures were well seen and photographed for the record.  The patient was noted to have scattered pancolonic diverticula.  However, the remainder of the colonic mucosa appeared normal.  From the level of the cecum and ileocecal valve, the scope was slowly withdrawn and all previously mentioned mucosal surfaces were again seen.  Again, no other abnormalities were observed.  The patient tolerated the procedure well and was reactive to endoscopy.  IMPRESSION: 1. Minimally friable anal canal without discrete hemorrhoids being seen. 2. Normal rectum. 3. Pan colonic diverticula.  The remainder of the colonic mucosa appeared    normal. 4. The patient does not have any lower GI tract septums.  RECOMMENDATIONS: 1. Diverticulosis literature provided [name]. 2. He should bolster his fiber intake. 3. Unless the patient were to become anemic or to display GI symptoms, I do    not feel that further GI evaluation is warranted.  He is to follow up    with Dr. Juanetta Gosling. Dictated by:   Roetta Sessions, M.D. Attending Physician:  Jonathon Bellows DD:  09/30/01 TD:  10/01/01 Job: [id number]

## 2011-01-12 NOTE — Discharge Summary (Signed)
[name], [name]             ACCOUNT NO.:  [id number]   MEDICAL RECORD NO.:  [id number]          PATIENT TYPE:  INP   LOCATION:  A329                          FACILITY:  [facility]   PHYSICIAN:  Ky Barban, M.D.DATE OF BIRTH:  10-19-1920   DATE OF ADMISSION:  09/14/2005  DATE OF DISCHARGE:  01/22/2007LH                                 DISCHARGE SUMMARY   HISTORY OF PRESENT ILLNESS:  This is an 77 year old gentleman who is well  known to me.  I have been following her for BPH for the last 10 years, and  lately, her symptoms have become worse.  Workup showed that his peak flow  rate is only 66 cc per second.  It takes him a long time to empty his  bladder.  I have recommended that he undergo TURP prostate, because his  large prostate was bladder neck obstruction.   PROCEDURES:  Limitations and complications discussed.  He understands and  wanted me to go ahead and proceed.   PAST MEDICAL HISTORY:  1.  Hypertension.  2.  Coronary artery disease.   HOSPITAL COURSE:  Preadmission workup done.  He was brought in the operating  room on January 19, and underwent TUR prostate.  Postop day #1 was January  21.  On postop day #1, he is doing well.  Abdomen is soft.  He is placed out  of bed.  CB was discontinued.  Postop day #2, he is up and walking around.  Urine is clear.  He took off his Foley catheter.  His pathology report is  back.  It shows benign prostatic hypertrophy, chronic interstitial  prostatitis.  At this point, it is decided to discharge him home.  Will be  followed in the office.  He is advised to continue his usual medication  except aspirin.  I will see him back in two weeks in the office.  He is  advised that if he has any fever, pain or difficulty voiding or hematuria to  let me know.      Ky Barban, M.D.  Electronically Signed     MIJ/MEDQ  D:  10/14/2005  T:  10/15/2005  Job:  [id number]

## 2011-01-12 NOTE — Procedures (Signed)
NAME:  BLASE, BECKNER NO.:  [id number]   MEDICAL RECORD NO.:  [id number]          PATIENT TYPE:  REC   LOCATION:                                FACILITY:  [facility]   PHYSICIAN:  Vida Roller, M.D.   DATE OF BIRTH:  11-13-1920   DATE OF PROCEDURE:  DATE OF DISCHARGE:                                    STRESS TEST   HISTORY:  Mr. Westrup is an 77 year old male with coronary artery disease  status post coronary artery bypass grafting and aortic valve replacement in  1999.  He had a percutaneous intervention to his RCA in 2000, normal EF by  echocardiogram in 2000.  Most recent Cardiolite was in June 2003 and  revealed no ischemia and normal EF.  The patient now presents with the  complaints of dyspnea on exertion.   BASELINE DATA:  Electrocardiogram reveals a sinus rhythm at 63 beats per  minute with poor R-wave progression and nonspecific ST abnormalities.  Blood  pressure is 120/62.   The patient exercised for a total of 7 minutes 26 seconds to Bruce protocol  stage 3 at 9.0 METS.  The maximal heart rate was 126 beats per minute, which  is 91% of predicted maximum.  Maximal blood pressure is 188/72 and resolved  down to 148/70 in recovery.  The patient described mild shortness of breath  at the end of exercise and right lower extremity cramp with exercise.  EKG  revealed no arrhythmias.  No ischemic changes were noted.   Myoview was injected one minute prior to cessation of exercise.   Final images and results are pending MD review.      AB/MEDQ  D:  11/15/2004  T:  11/15/2004  Job:  [id number]

## 2011-01-12 NOTE — Discharge Summary (Signed)
[name], Levine             ACCOUNT NO.:  [id number]   MEDICAL RECORD NO.:  [id number]          PATIENT TYPE:  INP   LOCATION:  A202                          FACILITY:  [facility]   PHYSICIAN:  Edward L. Juanetta Gosling, M.D.DATE OF BIRTH:  [birth year]   DATE OF ADMISSION:  08/12/2005  DATE OF DISCHARGE:  12/20/2006LH                                 DISCHARGE SUMMARY   DISCHARGE DIAGNOSES:  1.  Diaphoresis and left shoulder pain.  2.  Coronary artery occlusive disease with status post coronary artery      bypass grafting.  3.  Status post aortic valve replacement.  4.  Hypertension.  5.  Hyperlipidemia.  6.  Gastroesophageal reflux disease.  7.  Chronic lymphocytic leukemia.  8.  Sinus bradycardia.   HISTORY OF PRESENT ILLNESS:  Mr. [name] is an 77 year old who came to the  hospital after he had diaphoresis with left shoulder discomfort that was not  clearly associated with any exertion.  He has had a long known history of  coronary disease, status post bypass grafting.  He has also had an aortic  valve replacement.  He did not have chest discomfort, but there was concern  that this was an anginal equivalent.   PHYSICAL EXAMINATION:  GENERAL:  His physical examination on admission  showed a well-developed, well-nourished male who is in no acute distress.  VITAL SIGNS:  Temperature 97.8, pulse 65, respirations 20, blood pressure  145/70.  NECK:  He did not have any jugular venous distension.  His neck was supple.  CHEST:  His chest was clear.  HEART:  His heart was regular.  He did have an S4 gallop, no S3.  ABDOMEN:  His abdomen was soft and no masses are felt.  EXTREMITIES:  Showed no edema.  NEUROLOGIC:  Grossly intact.   LABORATORY DATA AND X-RAY FINDINGS:  Point of care markers were negative.  CBC and chemistry profile essentially normal.  D-dimer 1.36.   EKG showed sinus bradycardia.   HOSPITAL COURSE:  There was concern after this that he might have had  anginal  equivalent, so he underwent a stress test which was negative.  He  had a CT scan of the chest because he had an elevated D-dimer and that was  also negative.  He is discharged home in much improved condition with no  further episodes of diaphoresis to continue his medications.   DISCHARGE MEDICATIONS:  1.  Aspirin 81 mg daily.  2.  Prilosec 20 mg daily.  3.  Simvastatin 40 mg daily.  4.  Lisinopril 40 mg daily.  5.  HCTZ 25 mg daily.   FOLLOW UP:  He is follow up in my office in about a month.      Edward L. Juanetta Gosling, M.D.  Electronically Signed     ELH/MEDQ  D:  08/15/2005  T:  08/16/2005  Job:  [id number]

## 2011-01-12 NOTE — Cardiovascular Report (Signed)
NAME:  [name], [name]                       ACCOUNT NO.:  [id number]   MEDICAL RECORD NO.:  [id number]                   PATIENT TYPE:  INP   LOCATION:  2025                                 FACILITY:  [facility]   PHYSICIAN:  Learta Codding, M.D. [facility]             DATE OF BIRTH:  [birth year]   DATE OF PROCEDURE:  08/26/2002  DATE OF DISCHARGE:                              CARDIAC CATHETERIZATION   CARDIOLOGIST:  Maisie Fus C. Wall, M.D.   PROCEDURES PERFORMED:  1. Left heart catheterization with selective coronary angiography.  2. Ventriculography.  3. Grafting injection.   DIAGNOSES:  1. Severe native coronary artery disease with an occluded left anterior     descending.  2. Saphenous vein graft to diagonal ramus, patent saphenous vein graft to     obtuse marginal #1 and posterior descending artery occluded.  3. Left internal mammary artery to the left anterior descending, patent.  4. Normal left ventricular systolic function.   INDICATION:  The patient is a 77 year old male with a prior history of  coronary artery bypass grafting, status post percutaneous intervention with  three stents to the circumflex obtuse marginal branch by Dr. Gerri Spore in  2000.  The patient had been doing well.  He also has a history of aortic  valve replacement.  He presents with a fluttering sensation in the face, and  was concerned about having a stroke.  He also reported some chest pain at  that time which resolved with sublingual nitroglycerin.  He has been ruled  out for myocardial infarction but is being referred for cardiac  catheterization to assess his coronary anatomy.   DESCRIPTION OF PROCEDURE:  After informed consent was obtained, the patient  was brought to the catheterization laboratory.  The right groin was  sterilely prepped and draped.  A 6 French arterial sheath was placed using  the modified Seldinger technique.  Standard preformed JL4 and JR4 catheters  were used for the native  coronaries as well as graft injection and internal  mammary injection. A 6 French angled pigtail catheter was used for  ventriculography.  At the termination of the procedure, all catheters and  sheaths were removed and the patient was brought back to the holding area.  Adequate hemostasis provided. No complications were encountered.   FINDINGS:  HEMODYNAMICS:  Left ventricular pressure 156/6 mmHg, aortic  pressure 156/74 mmHg.   VENTRICULOGRAPHY:  Ejection fraction 65% without segmental wall motion  abnormalities.  No mitral regurgitation.   SELECTIVE CORONARY ANGIOGRAPHY:  1. Left main coronary artery was a large caliber vessel which appeared to be     patent.  2. Left anterior descending artery was occluded at its origin.  3. The left circumflex coronary artery was dominant and was a large caliber     vessel.  The stent in the proximal circumflex coronary artery was patent.     The second stent just prior to the first obtuse  marginal branch was also     largely patent.  The first obtuse marginal branch also had a stent placed     without any evidence of in-stent re-stenosis.  The circumflex and the AV     groove was moderately diseased with diffuse 30-40% stenosis.  A posterior     descending artery appeared to be patent.  There were additional two     posterolateral branches, which were free of significant flow-limiting     coronary artery disease.  4. Right coronary artery was a small vessel in its distal segment.  The     proximal segment had diffuse 20-30% stenosis in the distal aspect of his     vessel there was a high-grade 90% stenosis.   GRAFT ASSESSMENT:  1. Internal mammary artery graft to the LAD was widely patent. The LAD     proper also had no significant flow-limiting coronary artery disease     beyond the insertion of the graft.  2. The saphenous vein graft in a sequential fashion to the first obtuse     marginal branch in the posterior descending artery was  occluded in its     proximal segment.  The limb connecting the first obtuse marginal branch     and the posterior descending artery branch, however, was widely patent.  3. The saphenous vein graft also in a sequential fashion from the first     diagonal to the small ramus intermedius was patent.   RECOMMENDATIONS:  Angiographic __________  reviewed with Dr. Chales Abrahams. Although  the patient has high-grade region in the right coronary artery this is a  small and nondominant vessel.  Continued medical therapy is indicated.  Imdur will be added and the addition of Plavix could be considered,  particularly in the setting of prior bypass surgery and three stents placed  in the circumflex distribution.  I anticipate the patient can be discharged  in the morning.                                               Learta Codding, M.D. [facility]    GED/MEDQ  D:  08/26/2002  T:  08/27/2002  Job:  [id number]   cc:   Thomas C. Wall, M.D. [facility]  [address]  [place]  [place] [address]  Fax: 1

## 2011-01-12 NOTE — Op Note (Signed)
[name], [name]             ACCOUNT NO.:  [id number]   MEDICAL RECORD NO.:  [id number]          PATIENT TYPE:  AMB   LOCATION:  DAY                           FACILITY:  [facility]   PHYSICIAN:  Ky Barban, M.D.DATE OF BIRTH:  [birth year]   DATE OF PROCEDURE:  09/14/2005  DATE OF DISCHARGE:                                 OPERATIVE REPORT   PREOPERATIVE DIAGNOSIS:  Benign prostatic hypertrophy.   POSTOPERATIVE DIAGNOSIS:  Benign prostatic hypertrophy.   PROCEDURE:  Transurethral resection of the prostate.   ANESTHESIA:  Spinal.   PROCEDURE:  The patient was given spinal anesthesia and placed in lithotomy  position. After usual prep and drape, a #28 Iglesias resectoscope introduced  into the bladder. It is inspected. He has a large median lobe. Resectoscope  was pulled back in the prostatic urethra. Medial lobe was resected to the  level of the verumontanum. Now, the bladder neck was circumferentially  resected down to the circular fibers. Resectoscope was pulled back to the  level of the verumontanum and rotated to 11 o'clock position. Right lobe was  resected between 11 and 7 o'clock position. Similarly, the left lobe was  then resected between 1 and 5 o'clock position. There was still a large  amount of tissue in the lateral lobes. Posterior midline tissue was resected  along with the apical tissue. There was a large amount of tissue in the  anterior midline which was resected. Prostatic urethra looks wide open.  There is considerable tissue in the lateral lobes and in the posterior  midline, but there is no obstruction. I have removed significant amount of  the tissue. Chips were evacuated. Bleeders were coagulated. Resectoscope was  removed. A ____________ Foley catheter left in for drainage. The patient  left the operating room in satisfactory condition.      Ky Barban, M.D.  Electronically Signed     MIJ/MEDQ  D:  09/14/2005  T:  09/14/2005  Job:   [id number]

## 2011-01-12 NOTE — Discharge Summary (Signed)
[name]  Patient:    [name], [name] Visit Number: [id number] MRN: [id number]          Service Type: OUT Location: RAD Attending Physician:  Nelta Numbers Dictated by:   Margaretann Loveless, M.D. Admit Date:  02/17/2002 Disc. Date: 02/16/02   CC:         Kari Baars, M.D.  Valera Castle, M.D.   Discharge Summary  CONSULTATION:  Dr. Dietrich Pates, cardiology.  DISCHARGE DIAGNOSES:  1. Acute vertigo with symptoms resolved by the day of discharge.  Discharged     on Antivert for an additional four days and p.r.n. as noted below.  2. Coronary artery disease scheduled for outpatient stress test tomorrow.     Dr. Dietrich Pates did not feel he needed to stay in the hospital any further.     He did not feel any of this was cardiac related.  3. Glaucoma on Xalatan drops.  4. Hypertension, on lisinopril, with blood pressure relatively stable on     medication during this hospitalization.  5. Gastroesophageal reflux disease on Aciphex, asymptomatic.  6. Hyperlipidemia on Zocor.  7. Slightly decreased thyroid-stimulating hormone during this hospitalization     at 0.291 for followup as an outpatient.  No workup for hyperthyroidism has     been done at this time.  A free T4 is still pending at this time.  8. History of syncope in the past, none during this hospitalization.  9. History of chronic lymphocytic leukemia with no specific treatment at this     time. 10. Aortic stenosis noted on old records. 11. Thrombocytopenia with platelets slightly decreased on admission for     followup as an outpatient. 12. Sinus bradycardia, improved off of beta-blocker. 13. Bilateral lacunar infarcts in the basal ganglia. 14. Questionable hydrocephalus on computed tomography.  Followup computed     tomography as an outpatient.  DISCHARGE MEDICATIONS: 1. Lisinopril 40 mg p.o. q.d. 2. Enteric coated aspirin 325 mg q.d. 3. Aciphex 20 mg q.d. 4. Zocor 20 mg at  bedtime. 5. Antivert 25 mg q.12h. x2 days, then once a day x2 days, then every 12 hours    as needed. 6. Xalatan drops as prior to admission. 7. Hydrochlorothiazide 25 mg q.a.m.  FOLLOWUP:  The patient will follow up with Dr. Juanetta Gosling in approximately 7-10 days.  The staff will make an appointment today before discharge and give him the day and time.  He will follow up with Dr. Daleen Squibb as directed.  He will follow up with stress testing tomorrow as already scheduled.  He is to stop his Atenolol for now and restart this when seen by cardiology if they wish.  HISTORY OF PRESENT ILLNESS:  Please refer to the dictated History and Physical.  LABORATORY DATA AND X-RAY FINDINGS:  The patient ruled out for myocardial infarction with initial labs.  His chemistry panel showed normal electrolytes with sodium 139, potassium 3.8, chloride 109, bicarb 34 which is slightly elevated.  His hemoglobin A1C was 6.5.  His troponins were all less than 0.02. His lipid profile showed a total cholesterol of 180, triglycerides 80, HDL 57, LDL 107.  TSH is 0.291 which is decreased.  CK 118, MB 1.6.  PT and INR were normal.  CBC showed a white cell count of 6.9, hemoglobin 13.3 and platelets of 138,000.  EKG showed sinus rhythm on February 15, 2002, with decreased anterior R forces. His initial EKG showed a sinus bradycardia with a rate of 52.  His  telemetry monitor showed a heart rate of 40 at times, rate of 53 at times, rate of 38 at times and another rate of 38 on the day of admission, February 14, 2002.  HOSPITAL COURSE:  #1 - VERTIGO:  On admission, the patients symptoms of nausea, vomiting and dizziness were consistent with vertigo.  He had been to the ER one to two days prior with the same symptoms.  His symptoms were resolved during this hospitalization.  He will be continued on Antivert as an outpatient as noted above.  #2 - CORONARY ARTERY DISEASE:  Cardiology did see him on hospitalization and did not feel  that his symptoms were secondary to unstable angina and felt that he could be safely discharged home with followup stress test the very next day on February 17, 2002, as an outpatient.  We did elect to hold his beta-blocker today because of the sinus bradycardia that he had had during this admission.  #3 - SINUS BRADYCARDIA:  This was noted on the first day of admission with heart rates in the 30s at times.  His Atenolol was discontinued and his heart rates have been stable.  I have asked him to stop that Atenolol until seen by cardiologist as an outpatient.  #4 - HYPERTENSION:  His blood pressure was relatively stable on current medications.  As noted above, he is on lisinopril 40 mg q.d. and this will continue along with a low salt diet.  #5 - DECREASED THYROID-STIMULATING HORMONE:  This was noted during his hospitalization, although he does not clinically have any evidence of hyperthyroidism.  A free T4 was ordered and is still pending at this time and should be available for Dr. Juanetta Gosling in followup as an outpatient.  I would simply follow up his free T4 and repeat his TSH in another two to four weeks if he is asymptomatic.  #6 - HYPERCHOLESTEROLEMIA:  His HDL and LDL looked good.  His Zocor was continued throughout this hospitalization.  #7 - QUESTION OF HYDROCEPHALUS:  His CAT scan did show questionable hydrocephalus, although they doubt that it was actually hydrocephalus.  It was probably secondary to atrophy.  We ordered a neurology consultation to see him before discharge, but as of today, he has not been seen by neurology which was ordered on June 21.  Again, the CT showed this is unlikely any hydrocephalus and clinically, the patient is stable.  Thus, I feel he can be discharged to home.  I will let Dr. Juanetta Gosling simply repeat the CT scan in the next one to two months or send him for urology consultation in the next one to two months.   #8 - LACUNAR STROKES:  These were noted on  CT scan probably secondary to long-standing hypertension and high cholesterol.  He is on aspirin for now and is asymptomatic.  I would not change his medications at this time. Dictated by:   Margaretann Loveless, M.D. Attending Physician:  Nelta Numbers DD:  02/16/02 TD:  02/17/02 Job: [id number]

## 2011-01-12 NOTE — Procedures (Signed)
NAME:  [name], SALEK                       ACCOUNT NO.:  [id number]   MEDICAL RECORD NO.:  [id number]                   PATIENT TYPE:  OUT   LOCATION:  RESP                                 FACILITY:  [facility]   PHYSICIAN:  Vida Roller, M.D.                DATE OF BIRTH:  04-Apr-1921   DATE OF PROCEDURE:  07/28/2003  DATE OF DISCHARGE:                                EKG INTERPRETATION   REFERRING PHYSICIAN:  Bernerd Limbo. Leona Carry, M.D.   RESULTS:  The rhythm is sinus at a rate of 71 beats a minute.  There is  normal sinus rhythm.  The axis is 104 which is moderately rightward.  There  is evidence of an old anteroseptal myocardial infarction.  The QRS duration  is mildly prolonged at 94 ms.  The P.R.N. interval is mildly prolonged at  203 ms.  The QT interval corrected is normal at 393.  No old EKG's are  available for comparison.      ___________________________________________                                            Vida Roller, M.D.   JH/MEDQ  D:  07/28/2003  T:  07/28/2003  Job:  [id number]

## 2011-01-12 NOTE — Procedures (Signed)
   NAME:  [name], [name]                       ACCOUNT NO.:  [id number]   MEDICAL RECORD NO.:  [id number]                   PATIENT TYPE:  EMS   LOCATION:  ED                                   FACILITY:  [facility]   PHYSICIAN:  Edward L. Juanetta Gosling, M.D.             DATE OF BIRTH:  09/01/1920   DATE OF PROCEDURE:  08/25/2002  DATE OF DISCHARGE:  08/25/2002                                EKG INTERPRETATION   DATE AND TIME OF TEST:  August 25, 2002 at 1322.   IMPRESSION:  The rhythm is sinus rhythm with a rate in the 60s.  There is  slow R wave progression across the precordium which could indicate a  previous anterior myocardial infarction.  There are diffuse nonspecific ST-T  wave changes.  Abnormal electrocardiogram.                                               Oneal Deputy. Juanetta Gosling, M.D.    Gwenlyn Found  D:  08/25/2002  T:  08/26/2002  Job:  [id number]

## 2011-01-12 NOTE — H&P (Signed)
[facility]  Patient:    [name], [name] Visit Number: [id number] MRN: [id number]          Service Type: MED Location: 2A A225 01 Attending Physician:  Fredirick Maudlin Dictated by:   Leonia Reeves, M.D. Admit Date:  02/14/2002                           History and Physical  REASON FOR ADMISSION:  1. Acute symptomatic bradycardia probably secondary to sick sinus syndrome.  2. Rule out acute myocardial infarction.  CHIEF COMPLAINT:  Dizziness, nausea, and vomiting.  HISTORY OF PRESENT ILLNESS:  The patient is a 77 year old African-American male with history of multiple medical problems including coronary artery disease status post CABG, aortic stenosis, syncopal episodes, who presents in the ER with a one-day history of dizziness, nausea, and vomiting.  The patient was apparently well prior to 24-48 hours ago when he presented to emergency with the above-mentioned symptoms.  He said he had vomited a couple of times. He denied hematemesis.  He denied fever, diarrhea, and overt syncope.  PAST MEDICAL HISTORY:  1. Coronary artery disease, status post CABG.  2. PTCA with stent placement.  3. Chronic lymphocytic leukemia, on no specific therapy at this time.  4. Aortic stenosis.  5. Hypertension.  6. Syncopal episodes.  7. Status post hemorrhoidectomy.  8. Gastroesophageal reflux disease.  9. Diverticulosis. 10. Status post tonsillectomy. 11. Status post resection of lipoma from back. 12. Detached retina surgery.  SOCIAL HISTORY:  Negative for tobacco abuse.  Positive for social alcohol use. Denied any drug abuse.  FAMILY HISTORY:  Noncontributory.  ALLERGIES:  No known drug allergies.  CURRENT MEDICATIONS:  1. Aspirin 325 mg p.o. q.d. 2. Lisinopril 40 mg p.o. q.d. 3. Atenolol 25 mg p.o. q.d. 4. Hydrochlorothiazide 25 mg p.o. q.d. 5. Aciphex 20 mg p.o. q.d. 6. Zocor 20 mg at bedtime. 7. Xalatan drops q.d. in both eyes.  REVIEW OF  SYSTEMS:  As in the history.  Denied chest pain.  PHYSICAL EXAMINATION:  GENERAL:  The patient is an elderly, pleasant African-American man in no apparent distress.  The patient is alert and oriented x3.  VITAL SIGNS:  Initial vital signs:  Heart rate was in the range of 35-40 as per ER physician, blood pressure 159/81, respiratory rate 20.  Most recent vital signs:  Heart rate 55, blood pressure 159/65, temperature 96.7, oxygen saturation on 2 L 100%.  HEENT:  Head is normocephalic, atraumatic.  Pupils are equal, round, and reactive to light and accommodation.  Mucous membranes are moist.  No oropharyngeal lesion.  NECK:  Supple.  Without adenopathy.  No jugular venous distention.  No carotid bruit.  No thyromegaly.  CARDIOVASCULAR:  Heart rate is 55.  S1 and S2 are normal.  No S3, gallop, and no S4.  LUNGS:  Clear to auscultation and percussion both posterior and anterior. There is good air entry bilaterally.  GI:  The abdomen is obese.  No area of obvious tenderness.  No palpable mass. No organomegaly.  EXTREMITIES:  No pedal edema.  No cyanosis.  There is normal range of motion. Pulses are palpable and normal.  SKIN:  Warm.  No skin rash.  No skin breakdown.  NEUROLOGIC:  No focal neurologic deficits.  Cranial nerves II-XII are grossly intact.  LABORATORY DATA:  Initial EKG strip shows a heart rate of 40.  Most recent EKG shows sinus bradycardia with heart rate of  52.  Nonspecific ST-T changes.  No evidence of acute ischemia.  CBC:  White count 6.9, hemoglobin 13.3, hematocrit 39.1, MCV 89.5, platelets 138.  PT 12.8, INR 1, PTT 22.  Comprehensive metabolic panel:  Sodium 139, potassium 3.6, chloride 104, carbon dioxide 28, glucose elevated at 203.  BUN 18, creatinine 1.3, calcium 9.2, total protein 6.3, albumin 3.9, AST 20, ALT 21, alkaline phosphatase 80, bilirubin 0.6.  Cardiac panel:  Total CK 118, CK-MB 1.6, troponin I 0.02.  ASSESSMENT:  Elderly  African-American male with a history significant for coronary artery disease status post coronary artery bypass graft who now presents with symptomatic bradycardia with a heart rate in the range of 40s. Otherwise, vital signs essentially stable.  Physical examination essentially within normal limits.  Diagnostic investigations:  Cardiac enzymes within normal limits for the first set.  Glucose elevated at 203, otherwise comprehensive metabolic panel within normal limits.  So also is CBC.  PLAN:  The patient will be admitted to ICU with the following working diagnoses and plan: 1. Acute symptomatic bradycardia, probably secondary to sick sinus syndrome. 2. Rule out acute myocardial infarction. 3. Mild hyperglycemia, questionable diabetes mellitus of new onset.  In the ER the patient received a bolus of normal saline and was continued on IV normal saline.  He was also placed on ______.  This will continue in the ICU as needed.  The patient will continue to be closely monitored.  The protocols for rule out acute myocardial infarction will be pursued. Accordingly, the patient will be continued on p.o. aspirin, oxygen by nasal cannula, but beta blocker will be held for now.  More cardiac enzymes will be drawn.  The patients current medications will be added as needed.  Further investigation for hyperglycemia will be done accordingly.  Fingersticks q.a.c. will be done and, if glucose continues to go up to meet the criteria for diagnosis of diabetes mellitus, the patient will then be started on appropriate dose of insulin and oral hypoglycemic agent.  The patient will also be started on subcutaneous heparin 5000 units q.12h. for DVT prophylaxis. During this encounter I have taken the time to counsel the patient and his two daughters who accompanied him on this visit regarding his medical problems. Dictated by:   Leonia Reeves, M.D. Attending Physician:  Fredirick Maudlin DD:  02/14/02 TD:   02/15/02 Job: [id number]

## 2011-01-12 NOTE — Group Therapy Note (Signed)
NAMEBURREL, LEGRAND             ACCOUNT NO.:  [id number]   MEDICAL RECORD NO.:  [id number]          PATIENT TYPE:  INP   LOCATION:  A202                          FACILITY:  [facility]   PHYSICIAN:  Edward L. Juanetta Gosling, M.D.DATE OF BIRTH:  [birth year]   DATE OF PROCEDURE:  08/13/2005  DATE OF DISCHARGE:                                   PROGRESS NOTE   PROBLEM:  Diaphoresis, shoulder pain, history of coronary artery occlusive  disease and history of aortic valve replacement.   HISTORY:  Mr. Bhargava says he feels better this morning. He has no chest or  shoulder discomfort. He has had no further episodes of the significant  diaphoresis. He is not short of breath.   OBJECTIVE:  His exam shows his blood pressure is about 140/70, pulse is in  the 60s, his chest is very clear, heart is regular, his abdomen is soft, he  has trace edema, his CNS is grossly intact. Lab work thus far shows no  indication of myocardial injury.   ASSESSMENT:  He has what is concerning for angina equivalent.   PLAN:  My plan is to go ahead and get cardiology consultation, continue with  everything else and follow.      Edward L. Juanetta Gosling, M.D.  Electronically Signed     ELH/MEDQ  D:  08/13/2005  T:  08/13/2005  Job:  [id number]

## 2011-01-12 NOTE — Op Note (Signed)
[facility]  Patient:    [name]                     MRN: [id number] Proc. Date: 07/26/99 Adm. Date:  [id number] Attending:  Daisey Must CC:         Kari Baars, M.D.             Thomas C. Wall, M.D. [facility]             Cardiac Catheterization Lab                           Operative Report  PROCEDURE PERFORMED: 1. Left heart catheterization, coronary angiography, left ventriculography, aortic    root angiography, saphenous vein graft angiography, left internal mammary    arteriography. 2. Percutaneous transluminal coronary angioplasty with stent placement in the    ostium of the left circumflex. 3. Percutaneous transluminal coronary angioplasty with stent placement in the mid    left circumflex. 4. Percutaneous transluminal coronary angioplasty with stent placement in the first    obtuse marginal branch.  INDICATION:  Mr. Cammarata is a 77 year old male with a history of previous coronary artery bypass grafting and the placement of a porcine aortic valve in August of  last year.  He underwent angioplasty of a nondominant right coronary and of a distal anastomosis of a vein graft to a diagonal branch in June of this year by Dr. Riley Kill.  He presented with recurrent symptoms and a stress Cardiolite was positive for inferior posterior ischemia.  CATHETERIZATION PROCEDURAL NOTE:  A 6-French sheath was placed in the right femoral artery.  Catheters used included 6-French JL4, 6-French JR-4, 6-French IM, and 6-French angled pigtail.  Contrast was Omnipaque.  There were no complications.  CATHETERIZATION RESULTS:  HEMODYNAMICS:  Left ventricular pressure 172/28, aortic pressure 172/75.  There is no aortic valve gradient on pullback.  ANGIOGRAPHIC DATA:  Left ventriculogram:  There is mild hypokinesis of the proximal anterior wall.  Ejection fraction is estimated at 55-60%.  There is trace mitral regurgitation.  Aortic root  angiography reveals a functioning bioprosthetic aortic valve with no aortic insufficiency.  Coronary arteriography:  Left dominant system. 1. The left main has minor irregularities. 2. The LAD has an ostial 70% and a diffuse 70% proximal to mid vessel stenosis. 3. The left circumflex has an ostial 95% stenosis.  The mid vessel has a 75%    stenosis.  The distal vessel has a 50% stenosis just after the origin of OM-1.    There is a ramus intermedius which is 100% occluded and fills by a graft. OM-1    was large with a tubular 75% stenosis in the proximal graft.  There is a small    first posterolateral branch which was normal, a second posterolateral branch    that is small, a third posterolateral branch, and a small left posterior    descending artery.  There is a sequential portion of a vein graft visualized  from OM-1 to the third posterolateral branch.  However, this graft is occluded    proximal to the OM-1 insertion. 4. The right coronary artery is a small nondominant vessel.  There is a 25%    stenosis proximally and a 25% stenosis distally in a previous PTCA site.  The left internal mammary artery to the mid LAD is patent throughout its course.  Sequential saphenous vein graft to first diagonal and  ramus intermedius was patent; however, there is a 50% stenosis at the ostium of this graft.  Sequential saphenous vein graft to OM-1 and third posterolateral branch is 100%  occluded proximally.  As described above, the sequential portion between OM-1 and the posterolateral branch is still patent.  IMPRESSIONS: 1. Preserved left ventricular systolic function. 2. Native three-vessel coronary artery disease. 3. Patent LIMA to the LAD and patent sequential graft to diagonal and ramus. 4. Occluded graft to OM-1 and third posterolateral branch with significant    stenosis in the left circumflex as described.  PLAN:  These cines were reviewed with Dr. Riley Kill.  Options include a  repeat bypass surgery versus percutaneous intervention.  We felt that the risk of repeat bypass surgery would be substantial and percutaneous intervention would be the best initial option.  We thus opted to proceed with percutaneous intervention.  See below.  PERCUTANEOUS TRANSLUMINAL CORONARY ANGIOPLASTY PROCEDURAL NOTE:  Following completion of the diagnostic catheterization, the 6-French sheath in the right femoral artery was exchanged over wire for a 7-French sheath.  Heparin and Integrilin were administered per protocol.  A 7-French Voda left 3.5 guide was engaged in the left coronary ostium.  An ACS BMW wire was advanced under fluoroscopic guidance into the distal aspect of OM-1.  A 3.0 x 20-mm CrossSail balloon was advanced over this wire.  The ostium of the circumflex was dilated o eight atmospheres for 60 seconds.  Following this, the mid circumflex was dilated with the same balloon to 10 atmospheres for 60 seconds.  OM-1 was dilated with he same balloon to six atmospheres for 60 seconds.  We then performed one more inflation of the ostium of the circumflex with this balloon to 13 atmospheres for 60 seconds.  Next, a 3.0 x 13-mm Tetra stent was positioned in the first obtuse  marginal and deployed at 12 atmospheres for 53 seconds.  Following this, the 50% stenosis in the mid left circumflex had worsened to 80%.  We therefore passed a  floppy wire into the distal circumflex.  A 3.0 x 15-mm CrossSail balloon was positioned across this area, and two inflations were performed initially to 12 nd then 16 atmospheres.  Next, a 3.5 x 13-mm Tetra stent was placed in the mid circumflex and deployed initially at 17 atmospheres for 60 seconds.  A second inflation with this stent balloon was performed to 19 atmospheres for 55 seconds. Finally, a 3.5 x 9-mm NIR ROYALE stent was positioned at the ostium of the circumflex and deployed at 18 atmospheres for 48 seconds.  At that point,  the ostium of the LAD was compromised to a 99% stenosis with TIMI-1 flow.  We passed a  floppy wire through the stent side struts into the LAD.  A 2.5 x 15-mm CrossSail balloon was positioned through the stent side struts and inflated to 12 atmospheres for 48 seconds.  Final angiographic images were obtained revealing patency of all sites.  There was a less than 10% residual stenosis in the ostium of the circumflex.  The mid circumflex had a 0% residual stenosis and OM-1 had a 0% residual stenosis.  The distal circumflex had an approximately 40% residual stenosis.  The ostium to the LAD still had a residual 70% stenosis; however, there was TIMI-3 flow into this vessel.  As described above, this LAD is protected by  both a left internal mammary artery graft and a graft to the first diagonal.  PLAN:  Integrilin will be continued for 20 hours.  Plavix will be administered or four weeks. DD:  07/26/99 TD:  07/26/99 Job: [id number]

## 2011-01-12 NOTE — Consult Note (Signed)
NAME:  [name], [name]             ACCOUNT NO.:  [id number]   MEDICAL RECORD NO.:  [id number]           PATIENT TYPE:   LOCATION:                                 FACILITY:   PHYSICIAN:  R. Roetta Sessions, M.D. DATE OF BIRTH:  May 19, 1921   DATE OF CONSULTATION:  02/12/2006  DATE OF DISCHARGE:                                   CONSULTATION   REASON FOR CONSULTATION:  Iron-deficiency anemia, recent constipation.   HISTORY OF PRESENT ILLNESS:  Mr. [name] is a pleasant 77 year old  African American male followed by Drs. Juanetta Gosling and Neijstrom for CLL.  He  has been noted to have iron-deficiency anemia recently and has been sent  back to see me. Mr. [name] is noted to be hemoccult negative times six.  Dr. Mariel Sleet saw him back in April. From April 17th of this year his white  count was low at 2.7, H&H 10.6/31.5, MCV 87.7.  However, his iron-binding  capacity was 314, iron was 55, 80% saturation, he had a low at 18%.  His  ferritin was also at 16.  Mr. [name] is not experiencing any hematemesis  or melena. He has not had any hematemesis. He has chronic gastroesophageal  reflux disease for which he has been on omeprazole for years. His last EGD  was in December 1998 when he was found to have some antral gastritis. H.  pylori serologies were positive. He took triple-drug therapy.   His last colonoscopy was in February 2003. This was done for hemoccult  positive stool. He was found to have friable hemorrhages and pancolonic  diverticula. There is no family history of colorectal neoplasia. He has not  lost any weight. There has been no early satiety, odynophagia, or dysphagia.  He takes one aspirin 81 mg daily and an occasional Advil.   PAST MEDICAL HISTORY:  1.  Hypertension.  2.  CLL.  3.  History of coronary disease, valvular heart disease, status post aortic      valve replacement and aortic stenosis previously. Status post stent      placement previously.  Followed by  Dr. Valera Castle.   PAST SURGICAL HISTORY:  1.  Tonsillectomy.  2.  Eye surgery.  3.  Prostate surgery.   CURRENT MEDICATIONS:  Lisinopril, hydrochlorothiazide 20/25 daily,  simvastatin 80 mg half tablet at bedtime, Zetia 10 mg daily, omeprazole 20  mg daily, Teveten ophthalmic solution each eye at bedtime, Therex 150 mg  daily, multivitamin daily, ASA 81 mg daily, Advair p.r.n.   ALLERGIES:  No known drug allergies.   FAMILY HISTORY:  Mother died with typhoid fever. Cause of father's demise  unknown. No history of chronic GI or liver illness.   SOCIAL HISTORY:  The patient is  married. He has three children. He is a  retired [employer]. He stopped smoking in 1972. He has  not consumed any alcohol in many years.   REVIEW OF SYSTEMS:  GENERAL: No recent chest pain, dyspnea on exertion,  fever, or chills. His weight is down 15 pounds from what it was on September 22, 2001. He  appears in no acute distress.  VITAL SIGNS: Height 5 feet 6 inches, weight 183 pounds, temperature 98.1, BP  120/74, pulse 68.  SKIN: Warm and dry.  HEENT: No scleral icterus. JVD is not prominent.  CHEST: Lungs are clear to auscultation.  CARDIAC: Regular rate and rhythm without appreciable murmurs, rubs, or  gallops  ABDOMEN: Obese, positive bowel sounds, soft. I do not appreciate any  organomegaly or mass.  EXTREMITIES: No edema.   IMPRESSION:  Mr. [name] is a pleasant 77 year old gentleman with a  fairly well substantiated iron-deficiency anemia. It has been some four and  a half years since he had his lower GI tract evaluated and nearly 10 years  since he had his upper GI tract evaluated. He does take nonsteroidals as  outlined above. He could have easily developed occult lesions in his colon  in the past four and a half years or he could be harboring an occult gastric  ulcer or could be suffering some other NSAID insult producing a picture of  IDA secondary to slow GI  bleed, although again he is hemoccult negative  times six recently.   RECOMMENDATIONS:  I agree with Dr. Mariel Sleet with further evaluation as  warranted. I have offered Mr. [name] a colonoscopy at this time. At  the time of colonoscopy I will go ahead and perform an EGD to rule out an  occult ulcer, etc., in his upper GI tract contributing to the clinical  picture. Potential risks, benefits, and alternatives have been reviewed.   He will need SBE prophylactic antibiotics given his history of prosthetic  heart valve. Will make further recommendations in the very near future.   I would like to thank Dr. Glenford Peers for letting me see this nice  gentleman today.      [name], M.D.  Electronically Signed     RMR/MEDQ  D:  02/12/2006  T:  02/12/2006  Job:  [id number]   cc:   [name], M.D.  Fax: [phone]   Ladona Horns. Mariel Sleet, MD  Fax: [phone]

## 2011-01-12 NOTE — Procedures (Signed)
NAMEALMIN, LIVINGSTONE             ACCOUNT NO.:  [id number]   MEDICAL RECORD NO.:  [id number]          PATIENT TYPE:  OUT   LOCATION:  RAD                           FACILITY:  [facility]   PHYSICIAN:  Thomas C. Wall, M.D.   DATE OF BIRTH:  March 09, 1921   DATE OF PROCEDURE:  DATE OF DISCHARGE:  08/15/2005                                  ECHOCARDIOGRAM   INDICATIONS:  424.1 in V433.   Echocardiogram was technically adequate.   CONCLUSION:  1.  Mild left atrial enlargement.  2.  Mitral annular calcification.  3.  No mitral regurgitation.  4.  Normal left ventricular chamber size and overall systolic function, EF      greater than or equal to 60%.  5.  Aortic valve replacement.  Mean gradient of 7 with a peak of 10 mmHg.  6.  Left ventricular hypertrophy with disproportionate upper septal wall      thickness.  7.  Normal right sided structures and function.  8.  No pericardial effusion.      Thomas C. Wall, M.D.  Electronically Signed     TCW/MEDQ  D:  08/16/2005  T:  08/17/2005  Job:  [id number]

## 2011-01-12 NOTE — Consult Note (Signed)
NAMEMERRILL, [name]             ACCOUNT NO.:  [id number]   MEDICAL RECORD NO.:  [id number]          PATIENT TYPE:  INP   LOCATION:  A202                          FACILITY:  [facility]   PHYSICIAN:  [place] Bing, M.D. [facility] OF BIRTH:  05/06/1921   DATE OF CONSULTATION:  08/13/2005  DATE OF DISCHARGE:                                   CONSULTATION   REFERRING PHYSICIAN:  Dr. Juanetta Levine.  Primary cardiologist, Dr. Dietrich Levine.   HISTORY OF PRESENT ILLNESS:  This is an 77 year old gentleman presenting to  hospital after an episode of profuse diaphoresis associated with left  shoulder discomfort.  [name] has a long history of coronary artery  disease, having undergone CABG surgery in 1999, combined with aortic valve  replacement employing a bioprosthetic device.  He has had intermittent chest  discomfort and dyspnea since that procedure.  Repeat cardiac catheterization  in December 2003, revealed a patent LIMA graft to the LAD, a patent  saphenous vein graft to the first diagonal and an occluded vein graft to the  obtuse marginal and a nondominant right coronary artery.  His most recent  stress test was earlier this year.  He does not recall the indication for  that study.  He was found to have normal perfusion and normal left  ventricular systolic function.   Cardiovascular risk factors include hyperlipidemia and hypertension.  There  is also a history of cerebrovascular disease with prior CVA.   PAST MEDICAL HISTORY:  Is otherwise notable for GERD and leukemia,  presumably CLL.   ALLERGIES:  The patient reports no drug allergies.   MEDICATIONS PRIOR TO ADMISSION:  1.  Aspirin 81 mg daily.  2.  Prilosec 20 mg daily.  3.  Simvastatin 40 mg nightly.  4.  Lisinopril 40 mg daily.  5.  HCTZ 25 mg daily.   PHYSICAL EXAMINATION:  GENERAL:  On exam, a pleasant gentleman in no acute  distress.  VITAL SIGNS:  The temperature is 97.8, heart rate 65 and regular,  respirations 20, blood  pressure 145/70.  HEENT:  Bilateral arcus senilis; normal lids and conjunctiva.  Normal oral  mucosa.  NECK:  No jugular venous distension; normal carotid upstrokes without  bruits.  ENDOCRINE:  No thyromegaly.  HEMATOPOIETIC:  No adenopathy.  SKIN:  No significant lesions.  LUNGS:  Clear.  CARDIAC:  Normal S1, S2.  S4 present; normal PMI.  ABDOMEN:  Soft and nontender; normal bowel sounds; no masses; no  organomegaly.  EXTREMITIES:  Distal pulses intact; no edema.  MUSCULOSKELETAL:  No joint deformities.  NEUROLOGIC:  Symmetric strength and tone; normal cranial nerves.  PSYCHIATRIC:  Alert and oriented; normal affect.   LABORATORY DATA AND X-RAY FINDINGS:  Initial laboratory notable for negative  point of care markers.  Normal markers this morning 12 hours after his  symptoms.  Essentially normal CBC and chemistry profile.  D-dimer was 1.36.   EKG with sinus bradycardia; left atrial abnormality; low voltage in the limb  leads; borderline first-degree AV block; delayed R-wave progression, cannot  exclude previous anteroseptal myocardial infarction.  When compared to her  prior tracing  of October 31, 2004, axis has shifted leftward; otherwise in no  significant interval change.   IMPRESSION:  [name] presents with worrisome symptoms of profuse  diaphoresis and shoulder discomfort, possibly representing an anginal  equivalent.  He has no acute electrocardiogram abnormalities, a benign exam  and negative cardiac markers to date.   RECOMMENDATIONS:  A stress test would be worthwhile to exclude high-risk  disease.  Although his history is not particularly suggestive of pulmonary  embolism, if his stress test is negative, obtaining a CT scan of the chest  will be a consideration.  Optimal management of cardiovascular risk factors  will be verified while he is in hospital.  His prosthetic valve sounds fine  by exam.  We will obtain an echocardiogram to verify that function is   normal.   We appreciate the request for consultation and will be happy to follow this  nice gentleman with you.      [place] Bing, M.D. [facility]  Electronically Signed     RR/MEDQ  D:  08/13/2005  T:  08/14/2005  Job:  [id number]

## 2011-01-12 NOTE — H&P (Signed)
[facility]  Patient:    [name], [name] Visit Number: [id number] MRN:           Service Type: Attending:  Roetta Levine, M.D. Dictated by:   [name], M.D. Adm. Date:  09/22/01   CC:         [name], M.D.  [name]. Mariel Sleet, M.D.  Kari Baars, M.D.   History and Physical  DATE OF BIRTH:  1921/06/01  CHIEF COMPLAINT:  Hemoccult-positive stool.  HISTORY OF PRESENT ILLNESS:  [name] is a pleasant 77 year old gentleman with a history of CLL.  He came back to see me today.  He has been followed in this practice for quite awhile for gastroesophageal reflux disease.  Hemoccult tests last month revealed that 2/3 were positive.  He no change in his bowel habits.  He does not have any melena or rectal bleeding. No abdominal pain.  His reflux symptoms are well-controlled on Aciphex 20 mg orally daily.   There is no family history of colorectal neoplasia.  This gentleman underwent a colonoscopy back in 1997, by me, and was found to have pancolonic diverticulosis.  Otherwise, the mucosa appeared normal.  He was last seen here on August 04, 2001.  PAST MEDICAL HISTORY 1. Significant for CLL, for which he sees Dr. Ladona Levine. Neijstrom.  He is on    no specific therapy at this time. 2. History of coronary artery disease, status post stent placement down    in [place]. 3. Aortic stenosis (for which he receives SBE prophylaxis). 4. History of syncopal episodes, worked up down in [place].  PAST SURGICAL HISTORY 1. Hemorrhoidectomy. 2. Resection of a lipoma from his back 3. Tonsillectomy. 4. Detached retina surgery.  LABORATORY DATA:  A CBC from August 04, 2001, demonstrated a white count of 3.6, which was chronically mildly depressed, H&H 13.8 and 42.9, MCV 93. Metabolic sodium was normal.  CURRENT MEDICATIONS 1. Multivitamin supplement. 2. Tums p.r.n. 3. Aspirin 325 mg q.d. 4. Xalatan drops q.d. in both  eyes. 5. Lisinopril 40 mg q.d. 6. Atenolol 25 mg q.d. 7. HCTZ 25 mg q.d. 8. Aciphex 20 mg q.d. 9. Zocor 20 mg at bedtime.  ALLERGIES:  No known drug allergies.  FAMILY HISTORY:  Positive for multiple GI malignancies.  There is a history of colorectal neoplasia.  SOCIAL HISTORY:  The patient has been married for 54 years.  He has three children.  He is a retired Engineer, site.  He does not smoke and rarely consumes alcohol.  REVIEW OF SYSTEMS:  No recent chest pain, dyspnea on exertion.  No fever or chills.  No odynophagia or dysphagia.  No early satiety, nausea, or vomiting. He has actually had a weight gain of 4 pounds since December.  PHYSICAL EXAMINATION  GENERAL:  A pleasant 77 year old gentleman who appears his baseline.  VITAL SIGNS:  Weight 198-1/2 pounds, blood pressure 130/62, pulse 66.  SKIN:  Warm and dry.  HEENT:  No scleral icterus.  He has upper and lower partial plates.  NECK:  There is no cervical adenopathy.  CHEST:  Lungs are clear.  HEART:  A regular rate and rhythm without murmur, gallop, or appreciable rub.  ABDOMEN:  Obese with positive bowel sounds, soft, nontender.  No appreciable mass or organomegaly.  IMPRESSION:  [name] is a pleasant 77 year old gentleman with well-controlled gastroesophageal reflux disease.  He has had 2/3 Hemoccult cards positive.  It is reassuring that he did not have any significant  lesions on his 1997, colonoscopy.  PLAN:  Given this new information, he needs to have his colon checked now.  I discussed the colonoscopy with [name].  The potential risks, benefits, and alternatives have been reviewed.  The questions have been answered.  He is agreeable.  He has a moderately low risk for conscious sedation with Versed. He will need SBE prophylaxis, given his history of aortic stenosis.  Will plan to perform this procedure in the very near future at [facility]. Dictated by:   [name],  M.D. Attending:  Roetta Levine, M.D. DD:  09/22/01 TD:  09/22/01 Job: [id number]

## 2011-01-12 NOTE — Op Note (Signed)
NAME:  [name], [name]             ACCOUNT NO.:  [id number]   MEDICAL RECORD NO.:  [id number]          PATIENT TYPE:  AMB   LOCATION:  DAY                           FACILITY:  [facility]   PHYSICIAN:  R. Roetta Sessions, M.D. DATE OF BIRTH:  September 03, 1920   DATE OF PROCEDURE:  02/15/2006  DATE OF DISCHARGE:                                 OPERATIVE REPORT   PROCEDURE:  Diagnostic esophagogastroduodenoscopy, followed by colonoscopy  and ileostomy.   ENDOSCOPIST:  Jonathon Bellows, M.D.   INDICATIONS FOR PROCEDURE:  The patient is an 77 year old African/American  male with CLL.  He was found to have iron deficiency anemia.  He has been  repeated Hemoccult-negative.  An EGD and colonoscopy are now being done.  I  discussed at length with the patient the potential risks, be4nefits and  alternatives and any questions were answered.  He is agreeable.  Please see  documentation in the medical record.   DESCRIPTION OF PROCEDURE:  O2 saturation, blood pressure, pulse and  respirations were monitored throughout the entire procedure.  Conscious  sedation with Versed 3 mg IV and Demerol 75 mg IV in divided doses.  Cetacaine spray for topical pharyngeal anesthesia.  The instrument is the  Olympus videoscope system.   FINDINGS:  ESOPHAGOGASTRODUODENOSCOPY:  The examination of the tubular  esophagus revealed a non-critical distal esophageal ring.  The esophageal  mucosa otherwise appeared normal.  The EG junction was easily traversed.  Entered the stomach, __________ gastric cavity was empty and distended well  with air.  After a thorough examination of the gastric mucosa, retroflexed  view of the proximal stomach and esophagogastric junction demonstrated some  deformity of the antral pre-pyloric mucosa, but there was no ulcer or  infiltrating process or other abnormality.  The pylorus was easily  traversed.  Examination of the bulb and second portion revealed no  abnormalities.   THERAPEUTIC/DIAGNOSTIC MANEUVERS:  None.   The patient tolerated the procedure well and was prepared for a colonoscopy.   COLONOSCOPY:  A digital rectal examination revealed no abnormalities.   ENDOSCOPIC FINDINGS:  The prep was adequate.   RECTUM:  Examination of the rectal mucosa in a retroflexed view at the anal  verge revealed no abnormalities.   COLON:  The colonic mucosa was surveyed from the rectosigmoid junction  through the left and transverse and right colon, into the appendiceal  orifice and ileocecal valve and cecum.  These structures were well seen and  photographed for the record.  The terminal ileum was then intubated to 10  cm.  The Olympus videoscope was slowly withdrawn and all previously-  mentioned mucosa surfaces were again seen.  The patient had diverticula from  the rectosigmoid junction all the way to the cecum; however, the colonic  mucosa otherwise appeared entirely normal, as did the terminal ileum.   The patient tolerated both procedures well and was reactive in endoscopy.   ESOPHAGOGASTRODUODENOSCOPY IMPRESSION:  1.  Non-critical esophageal ring, otherwise normal esophagus.  2.  Some deformity of the antrum and pre-pyloric mucosa, but otherwise      normal-appearing gastric mucosa.  Patent  pylorus, normal D-I and D-II.   COLONOSCOPY IMPRESSION/FINDINGS:  1.  Normal rectum.  2.  Pan-colonic diverticula and colonic mucosa appeared normal.  Normal      terminal ileum.   The findings are reassuring.  It is possible that the patient could have an  inset insult to his small bowel contributing to the clinical picture is iron  deficiency anemia.   RECOMMENDATIONS:  If he displays continued iron deficiency and/or is  Hemoccult-positive, we could consider a capsule imaging of his small bowel  in the future.   The patient is to follow up with Dr. Ladona Horns. Neijstrom.      Jonathon Bellows, M.D.  Electronically Signed     RMR/MEDQ  D:  02/15/2006  T:   02/15/2006  Job:  [id number]   cc:   Ladona Horns. Mariel Sleet, MD  Fax: [phone]   Oneal Deputy. Juanetta Gosling, M.D.  Fax: [phone]

## 2011-01-12 NOTE — Procedures (Signed)
[name], [name]             ACCOUNT NO.:  [id number]   MEDICAL RECORD NO.:  [id number]          PATIENT TYPE:  OUT   LOCATION:  RAD                           FACILITY:  [facility]   PHYSICIAN:  Edward L. Juanetta Gosling, M.D.DATE OF BIRTH:  Jul 16, 1921   DATE OF PROCEDURE:  DATE OF DISCHARGE:  12/10/2005                              PULMONARY FUNCTION TEST   RESULTS:  1.  Spirometry shows air flow obstruction in the smaller airways; however,      there is no ventilatory defect.  2.  Lung volumes are normal.  3.  DLCO is normal.  4.  Blood gases are normal.      Edward L. Juanetta Gosling, M.D.  Electronically Signed     ELH/MEDQ  D:  12/14/2005  T:  12/14/2005  Job:  [id number]

## 2011-01-12 NOTE — Group Therapy Note (Signed)
NAMETIMMY, BUBECK             ACCOUNT NO.:  [id number]   MEDICAL RECORD NO.:  [id number]          PATIENT TYPE:  INP   LOCATION:  A202                          FACILITY:  [facility]   PHYSICIAN:  Edward L. Juanetta Gosling, M.D.DATE OF BIRTH:  1921/07/26   DATE OF PROCEDURE:  08/15/2005  DATE OF DISCHARGE:                                   PROGRESS NOTE   PROBLEM:  Angina equivalent diaphoresis.   SUBJECTIVE:  Mr. Blandon says he feels well and has no complaints.  He had  a stress test yesterday that looked very good for a patient who has already  had known coronary disease and because that was normal, but he had an  elevated D-dimer, he had a CT chest which was negative for pulmonary embolus  and not a particularly good study, but no central pulmonary emboli.  I  believe it would take central pulmonary emboli to provide his symptoms, I  think it is okay for him to go home now.  He says he feels well.  He has no  complaints with no chest pain, no diaphoresis.   PHYSICAL EXAMINATION:  VITAL SIGNS:  Physical exam shows temperature is  98.4, pulse 56, respirations 18, blood sugar 117, blood pressure 113/62.   ASSESSMENT:  He is much improved.   PLAN:  Plan is for discharge home.  Please see discharge summary for  details.      Edward L. Juanetta Gosling, M.D.  Electronically Signed     ELH/MEDQ  D:  08/15/2005  T:  08/16/2005  Job:  [id number]

## 2011-01-12 NOTE — Group Therapy Note (Signed)
[name], [name]             ACCOUNT NO.:  [id number]   MEDICAL RECORD NO.:  [id number]          PATIENT TYPE:  INP   LOCATION:  A202                          [facility]ACILITY:  [facility]   PHYSICIAN:  Edward L. Juanetta Gosling, M.D.DATE OF BIRTH:  05-02-1921   DATE OF PROCEDURE:  08/14/2005  DATE OF DISCHARGE:                                   PROGRESS NOTE   PROBLEM:  Possible anginal equivalent coronary disease status post valve  replacement.   SUBJECTIVE:  [name] says he is feeling pretty well. Has had no further  episodes of the diaphoresis, no syncope, no chest pain and he is not short  of breath. He says he is feeling okay today.   OBJECTIVE:  VITAL SIGNS:  His exam shows that is temperature is 98.4, pulse  62, respirations 16, blood sugar 107, blood pressure 123/59.  GENERAL:  He looks comfortable.  CHEST:  His chest is very clear.  RECTAL:  His heart is regular.  ABDOMEN:  His abdomen is soft.   ASSESSMENT:  He is much improved.   PLAN:  He is set for a stress test today and will plan to continue with his  other treatments and follow.      Edward L. Juanetta Gosling, M.D.  Electronically Signed     ELH/MEDQ  D:  08/14/2005  T:  08/15/2005  Job:  [id number]

## 2011-01-23 ENCOUNTER — Ambulatory Visit (HOSPITAL_COMMUNITY): Payer: Medicare Other

## 2011-03-05 ENCOUNTER — Encounter: Payer: Self-pay | Admitting: Cardiology

## 2011-03-06 ENCOUNTER — Encounter: Payer: Self-pay | Admitting: Cardiology

## 2011-03-06 ENCOUNTER — Encounter: Payer: Self-pay | Admitting: *Deleted

## 2011-03-06 ENCOUNTER — Ambulatory Visit (INDEPENDENT_AMBULATORY_CARE_PROVIDER_SITE_OTHER): Payer: Medicare Other | Admitting: Cardiology

## 2011-03-06 DIAGNOSIS — C9110 Chronic lymphocytic leukemia of B-cell type not having achieved remission: Secondary | ICD-10-CM

## 2011-03-06 DIAGNOSIS — I2510 Atherosclerotic heart disease of native coronary artery without angina pectoris: Secondary | ICD-10-CM

## 2011-03-06 DIAGNOSIS — F17201 Nicotine dependence, unspecified, in remission: Secondary | ICD-10-CM | POA: Insufficient documentation

## 2011-03-06 DIAGNOSIS — G20A1 Parkinson's disease without dyskinesia, without mention of fluctuations: Secondary | ICD-10-CM

## 2011-03-06 DIAGNOSIS — Z954 Presence of other heart-valve replacement: Secondary | ICD-10-CM

## 2011-03-06 DIAGNOSIS — E785 Hyperlipidemia, unspecified: Secondary | ICD-10-CM

## 2011-03-06 DIAGNOSIS — G20 Parkinson's disease: Secondary | ICD-10-CM

## 2011-03-06 DIAGNOSIS — I10 Essential (primary) hypertension: Secondary | ICD-10-CM | POA: Insufficient documentation

## 2011-03-06 DIAGNOSIS — K759 Inflammatory liver disease, unspecified: Secondary | ICD-10-CM | POA: Insufficient documentation

## 2011-03-06 DIAGNOSIS — G4700 Insomnia, unspecified: Secondary | ICD-10-CM

## 2011-03-06 MED ORDER — AMLODIPINE BESYLATE 5 MG PO TABS: 2.5000 mg | ORAL_TABLET | Freq: Every day | ORAL | Status: DC

## 2011-03-06 MED ORDER — CHLORTHALIDONE 25 MG PO TABS: 12.5000 mg | ORAL_TABLET | Freq: Every day | ORAL | Status: DC

## 2011-03-06 MED ORDER — ATORVASTATIN CALCIUM 80 MG PO TABS: 80.0000 mg | ORAL_TABLET | Freq: Every day | ORAL | Status: DC

## 2011-03-06 NOTE — Assessment & Plan Note (Signed)
No symptoms to suggest progression of coronary disease.  We will continue to optimize treatment of cardiovascular risk factors.

## 2011-03-06 NOTE — Assessment & Plan Note (Signed)
No physical findings to suggest prosthetic valve disease.  Echocardiogram in 2006 was unremarkable.  I will continue to follow clinically in the absence of symptoms and significant murmurs.

## 2011-03-06 NOTE — Assessment & Plan Note (Addendum)
Patient is taking moderate dose Ambien for this problem, which is associated with a high risk of significant adverse effects in this very elderly gentleman.  Tapering the dosage and using as infrequently as possible would likely decrease the risk of falls and exacerbation of the patient's baseline dementia.

## 2011-03-06 NOTE — Assessment & Plan Note (Addendum)
Blood pressure control is quite suboptimal with no orthostatic change noted.  Renal function was normal when last assessed.  We will obtain Dr. Juanetta Gosling notes, but it appears that continuing antihypertensive therapy will be necessary.  Amlodipine will be started at a dose of 2.5 mg q.d. And chlorthalidone at 12.5 mg q.d.  Blood pressure will be reassessed at home and at a nursing visit in one month.  Chemistry profile will be reassessed in one month.

## 2011-03-06 NOTE — Progress Notes (Signed)
  HPI : Mr. Bastin returns to the office for continued assessment and treatment of hypertension, hyperlipidemia and coronary artery disease.  Since his last visit, he has apparently done generally well.  He continues to be followed by Dr. Gerilyn Pilgrim with the diagnosis of Parkinson's disease and dementia.  He continues to drive, but not at night and generally only when accompanied by a family member.  Antihypertensive medication was discontinued by Dr. Juanetta Gosling in response to a number of falls.  No recent determinations of blood pressure are available.  Patient complains of insomnia and memory deterioration.  Current Outpatient Prescriptions on File Prior to Visit  Medication Sig Dispense Refill  . latanoprost (XALATAN) 0.005 % ophthalmic solution Place 1 drop into both eyes daily.        . simvastatin (ZOCOR) 80 MG tablet Take 80 mg by mouth at bedtime.        . chlorthalidone (HYGROTON) 25 MG tablet Take 0.5 tablets (12.5 mg total) by mouth daily.  15 tablet  3  . DISCONTD: aspirin (ASPIR-81) 81 MG EC tablet Take 81 mg by mouth daily.        Marland Kitchen DISCONTD: lisinopril-hydrochlorothiazide (PRINZIDE,ZESTORETIC) 20-12.5 MG per tablet Take 1 tablet by mouth daily.        Marland Kitchen DISCONTD: Multiple Vitamins-Minerals (MULTIVITAL) tablet Take 1 tablet by mouth daily.        Marland Kitchen DISCONTD: omeprazole (PRILOSEC) 20 MG capsule Take 20 mg by mouth daily.           Allergies  Allergen Reactions  . Shellfish Allergy   . Zolpidem Tartrate       Past medical history, social history, and family history reviewed and updated.  ROS: See history of present illness.  PHYSICAL EXAM: BP 168/71  Pulse 52  Ht 5\' 8"  (1.727 m)  Wt 77.111 kg (170 lb)  BMI 25.85 kg/m2  SpO2 93%  General-Well developed; no acute distress Body habitus-proportionate weight and height Neck-No JVD; no carotid bruits Lungs-clear lung fields; resonant to percussion Cardiovascular-normal PMI; normal S1 and S2; modest systolic ejection  murmur Abdomen-normal bowel sounds; soft and non-tender without masses or organomegaly Musculoskeletal-No deformities, no cyanosis or clubbing Neurologic-Normal cranial nerves; symmetric strength and tone; alert and orientedx3; mild tremor of the right upper extremity Skin-Warm, no significant lesions Extremities-distal pulses intact; 1+ ankle edema  ASSESSMENT AND PLAN:

## 2011-03-06 NOTE — Patient Instructions (Addendum)
Your physician recommends that you schedule a follow-up appointment in: 6 MONTHS Your physician has recommended you make the following change in your medication: CHLORTHALIDONE 25MG   TAKE 1/2 TABLET BY MOUTH DAILY, AMLODIPINE 5MG  TAKE 1/2 TABLET BY MOUTH DAILY,CHANGE SIMVASTATIN TO ATORVASTATIN 80MG  DAILY Your physician recommends that you return for lab work in: 1 MONTH Your physician has requested that you regularly monitor and record your blood pressure readings at home. Please use the same machine at the same time of day to check your readings and record them to bring to your follow-up visit. AND RECORD NURSE VISIT IN 1 MONTH - PLEASE BRING BLOOD PRESSURE DIARY TO NURSE VISIT

## 2011-03-06 NOTE — Assessment & Plan Note (Addendum)
Efficacy of therapy was good when last assessed one year ago.  Due to the addition of amlodipine, simvastatin will be discontinued and atorvastatin substituted.  A repeat lipid profile will be obtained next year.

## 2011-03-16 ENCOUNTER — Encounter: Payer: Self-pay | Admitting: Cardiology

## 2011-03-30 ENCOUNTER — Other Ambulatory Visit: Payer: Self-pay | Admitting: Cardiology

## 2011-03-31 LAB — LIPID PANEL
HDL: 68 mg/dL (ref >39)
LDL Cholesterol: 96 mg/dL (ref 0–99)
Total CHOL/HDL Ratio: 2.6 Ratio
VLDL: 12 mg/dL (ref 0–40)

## 2011-03-31 LAB — BASIC METABOLIC PANEL
CO2: 31 mEq/L (ref 19–32)
Chloride: 100 mEq/L (ref 96–112)
Glucose, Bld: 111 mg/dL — ABNORMAL HIGH (ref 70–99)
Sodium: 139 mEq/L (ref 135–145)

## 2011-04-03 ENCOUNTER — Encounter: Payer: Self-pay | Admitting: Cardiology

## 2011-04-03 ENCOUNTER — Ambulatory Visit (INDEPENDENT_AMBULATORY_CARE_PROVIDER_SITE_OTHER): Payer: Medicare Other

## 2011-04-03 DIAGNOSIS — I359 Nonrheumatic aortic valve disorder, unspecified: Secondary | ICD-10-CM

## 2011-04-03 DIAGNOSIS — Z8669 Personal history of other diseases of the nervous system and sense organs: Secondary | ICD-10-CM

## 2011-04-03 NOTE — Progress Notes (Signed)
S: Pt. arrives in office for a 1 month BP check with nurse. B: At last OV with Dr. Dietrich Pates on 03-06-11 pt. was advised to start taking Chlorthalidone 12.5 mg daily and Amlodipine 2.5 mg daily for HTN. Due to the addition of Amlodipine, Simvastatin was changed to Atorvastatin 80 mg. Also pt. was advised to monitor and record BP's and bring diary to this visit. A: Pt c/o dizziness. His daughter came with him to this BP check and states that pt. fell out of bed last Thursday (03-29-11) and the pt. states he fell about a 2 weeks ago while walking down hallway at home due to dizziness. He brought in BP diary, readings are similar to today's BP, (diary faxed to [facility] office to be scanned into pt's chart, also a copy of diary placed in Dr. Marvel Plan records folder for his review) but did not bring his medications to this visit. His daughter states she prepares pt's medications and that he is taking as directed. Pt's BP this morning is 145/65 and on last OV BP was 168/71She states that Dr. Juanetta Gosling is treating pt. at this time for UTI with Cipro 500mg  bid X 2 weeks. R: Pt. is advised to continue current medical treatment and that we will contact him with Dr. Marvel Plan recommendations.

## 2011-04-05 NOTE — Progress Notes (Signed)
Appreciate excellent note by Ms. Lelon Perla. In the absence of significant orthostatic decrease in BP and the presence of treatment for a UTI, current symptoms would best be evaluated by Dr. Juanetta Gosling.

## 2011-04-27 ENCOUNTER — Encounter: Payer: Self-pay | Admitting: Cardiology

## 2011-05-03 ENCOUNTER — Encounter: Payer: Self-pay | Admitting: Cardiology

## 2011-05-11 ENCOUNTER — Encounter: Payer: Self-pay | Admitting: Adult Health

## 2011-05-11 ENCOUNTER — Ambulatory Visit (INDEPENDENT_AMBULATORY_CARE_PROVIDER_SITE_OTHER): Payer: Medicare Other | Admitting: Adult Health

## 2011-05-11 VITALS — BP 145/76 | HR 65 | Resp 18 | Ht 66.0 in | Wt 164.4 lb

## 2011-05-11 DIAGNOSIS — I10 Essential (primary) hypertension: Secondary | ICD-10-CM

## 2011-05-11 NOTE — Assessment & Plan Note (Signed)
I have re-evaluated his BP with orthostatics as above with no significant drop in BP. His blood pressure is very well controlled at this time.  With age, would consider keeping him at a higher normal 130's systolic and this is better tolerated in the elderly patients. This can re-evaluated on follow-up.  He is advised to use his cane for support in the setting of Parkinson's for shuffling feet.  He will follow up in 3 months with Dr. Dietrich Pates.

## 2011-05-11 NOTE — Progress Notes (Signed)
HPI:  [name] is a pleasant 77 y/o patient of Dr. Dietrich Pates, we are seeing on follow-up after medication adjustments for hypertension. He has a history of Parkinson's disease and is followed by Dr. Gerilyn Pilgrim for this. On last visit, he was placed on chlrothalidone 12.5 mg daily, and amlodipine 5 mg and prinzide was discontinued.  He had been doing well until approximately two days ago, where he fell. He said he didn't pick up his feet and stumbled. He did not lose consciousness or have dizziness prior the event.  He did not injure himself. He has not no other episodes since that time.  Allergies  Allergen Reactions  . Shellfish Allergy   . Zolpidem Tartrate     Current Outpatient Prescriptions  Medication Sig Dispense Refill  . amLODipine (NORVASC) 5 MG tablet Take 0.5 tablets (2.5 mg total) by mouth daily.  15 tablet  3  . atorvastatin (LIPITOR) 80 MG tablet Take 1 tablet (80 mg total) by mouth daily.  30 tablet  3  . carbidopa-levodopa (SINEMET) 25-100 MG per tablet Take 1 tablet by mouth daily.       . chlorthalidone (HYGROTON) 25 MG tablet Take 0.5 tablets (12.5 mg total) by mouth daily.  15 tablet  3  . donepezil (ARICEPT) 10 MG tablet Take 10 mg by mouth at bedtime as needed.       . latanoprost (XALATAN) 0.005 % ophthalmic solution Place 1 drop into both eyes daily.        Marland Kitchen zolpidem (AMBIEN) 10 MG tablet Take 10 mg by mouth at bedtime as needed.         Past Medical History  Diagnosis Date  . ASCVD (arteriosclerotic cardiovascular disease)     CABG & bioprosthetic aortic valve replace in 1999. PCI of right coronary in 2000, catheter in 07/2002 revealed total insertion of the left anterior descending; pt LIMA graft; total obstruc of the saphenous vein graft to first marg w pt terminal limb between 1st & 2nd marginals;; pt circumflex stent; nondom RCA; normal EF  . TIA (transient ischemic attack)   . Hypertension     normal CMet and 2011  . Hyperlipidemia     Lipid profile in  12/2009-170, 73, 79, 76  . PVD (peripheral vascular disease)   . Parkinson disease 2010    mild; + dementia  . History of sinus bradycardia     induced by beta blocker  . History of syncope   . Chronic lymphatic leukemia     mild pancytopenia in 02/2010;hemoglobin-12.9, WBC-3.3, platelets-109  . Hepatitis   . Glaucoma 09/1994  . Tobacco abuse, in remission     20 pack years; discontinued 50 years ago  . Insomnia     Middle of the night awakening    Past Surgical History  Procedure Date  . Retinal detachment surgery 1989  . Tonsillectomy   . Transurethral resection of prostate     Benign prostatic hypertrophy  . Coronary artery bypass graft 1999    + Bioprosthetic AVR  . Colonoscopy 2011    MWU:XLKGMW of systems complete and found to be negative unless listed above PHYSICAL EXAM BP 145/76  Pulse 65  Resp 18  Ht 5\' 6"  (1.676 m)  Wt 164 lb 6.4 oz (74.571 kg)  BMI 26.53 kg/m2  SpO2 95% General: Well developed, well nourished, in no acute distress Head: Eyes PERRLA, No xanthomas.   Normal cephalic and atramatic  Lungs: Clear bilaterally to auscultation and percussion. Heart: HRRR S1  S2, 1/6 systolic murmur. Pulses are equal.            No carotid bruit. No JVD.  No abdominal bruits. No femoral bruits. Abdomen: Bowel sounds are positive, abdomen soft and non-tender without masses or                  Hernia's noted. Msk:  Back normal, normal gait. Normal strength and tone for age. Extremities: No clubbing, cyanosis or edema.  DP +1 Neuro: Alert and oriented X 3. Psych:  Good affect, responds appropriately    ASSESSMENT AND PLAN

## 2011-05-11 NOTE — Patient Instructions (Signed)
Your physician recommends that you schedule a follow-up appointment in: 3 months with Dr Rothbart  

## 2011-05-17 LAB — CBC
MCV: 89.5
Platelets: 122 — ABNORMAL LOW
RBC: 3.93 — ABNORMAL LOW
WBC: 4

## 2011-05-17 LAB — COMPREHENSIVE METABOLIC PANEL
Albumin: 3.5
BUN: 14
Chloride: 102
Creatinine, Ser: 1.21
GFR calc non Af Amer: 57 — ABNORMAL LOW
Total Bilirubin: 0.6

## 2011-05-17 LAB — DIFFERENTIAL
Basophils Absolute: 0
Lymphocytes Relative: 20
Monocytes Absolute: 0.3
Neutro Abs: 2.8

## 2011-05-17 LAB — POCT CARDIAC MARKERS
CKMB, poc: 2
Operator id: 215201
Troponin i, poc: <0.05

## 2011-05-31 ENCOUNTER — Other Ambulatory Visit: Payer: Self-pay | Admitting: *Deleted

## 2011-05-31 MED ORDER — CHLORTHALIDONE 25 MG PO TABS: 12.5000 mg | ORAL_TABLET | Freq: Every day | ORAL | Status: DC

## 2011-05-31 MED ORDER — AMLODIPINE BESYLATE 5 MG PO TABS: 2.5000 mg | ORAL_TABLET | Freq: Every day | ORAL | Status: DC

## 2011-05-31 MED ORDER — ATORVASTATIN CALCIUM 80 MG PO TABS: 80.0000 mg | ORAL_TABLET | Freq: Every day | ORAL | Status: DC

## 2011-06-12 LAB — DIFFERENTIAL
Basophils Relative: 1
Eosinophils Absolute: 0.1
Monocytes Absolute: 0.4
Monocytes Relative: 14 — ABNORMAL HIGH
Neutro Abs: 1.2 — ABNORMAL LOW

## 2011-06-12 LAB — COMPREHENSIVE METABOLIC PANEL
ALT: 23
Albumin: 3.7
Alkaline Phosphatase: 81
Potassium: 4.6
Sodium: 138
Total Protein: 6.3

## 2011-06-12 LAB — CBC
Platelets: 154
RDW: 15 — ABNORMAL HIGH

## 2011-08-02 ENCOUNTER — Encounter: Payer: Self-pay | Admitting: Cardiology

## 2011-08-13 ENCOUNTER — Ambulatory Visit: Payer: Medicare Other | Admitting: Cardiology

## 2011-08-30 ENCOUNTER — Ambulatory Visit (INDEPENDENT_AMBULATORY_CARE_PROVIDER_SITE_OTHER): Payer: Medicare Other | Admitting: Cardiology

## 2011-08-30 ENCOUNTER — Encounter: Payer: Self-pay | Admitting: Cardiology

## 2011-08-30 DIAGNOSIS — C9110 Chronic lymphocytic leukemia of B-cell type not having achieved remission: Secondary | ICD-10-CM

## 2011-08-30 DIAGNOSIS — F17201 Nicotine dependence, unspecified, in remission: Secondary | ICD-10-CM

## 2011-08-30 DIAGNOSIS — Z954 Presence of other heart-valve replacement: Secondary | ICD-10-CM

## 2011-08-30 DIAGNOSIS — I10 Essential (primary) hypertension: Secondary | ICD-10-CM

## 2011-08-30 DIAGNOSIS — E785 Hyperlipidemia, unspecified: Secondary | ICD-10-CM

## 2011-08-30 DIAGNOSIS — I359 Nonrheumatic aortic valve disorder, unspecified: Secondary | ICD-10-CM

## 2011-08-30 DIAGNOSIS — I739 Peripheral vascular disease, unspecified: Secondary | ICD-10-CM

## 2011-08-30 DIAGNOSIS — I2510 Atherosclerotic heart disease of native coronary artery without angina pectoris: Secondary | ICD-10-CM

## 2011-08-30 DIAGNOSIS — G459 Transient cerebral ischemic attack, unspecified: Secondary | ICD-10-CM

## 2011-08-30 NOTE — Assessment & Plan Note (Signed)
No symptoms to suggest progression of coronary artery disease or symptomatic myocardial ischemia.  Our focus will continue to be on optimal control of cardiovascular risk factors.

## 2011-08-30 NOTE — Assessment & Plan Note (Signed)
Blood pressure control is adequate with current medications, which will be continued.

## 2011-08-30 NOTE — Assessment & Plan Note (Signed)
Exam is benign with no evidence for valve dysfunction.  Echocardiography is not required in the absence of symptoms or physical findings suggesting a problem.

## 2011-08-30 NOTE — Patient Instructions (Signed)
Your physician recommends that you schedule a follow-up appointment in: 8 months  Your physician recommends that you return for lab work in: Tomorrow  No more weight gain/ Increase exercise

## 2011-08-30 NOTE — Assessment & Plan Note (Addendum)
Patient has physical findings of acute bronchospasm, which have not been present during past evaluations.  Most likely, this represents a viral upper respiratory infection rather than chronic obstructive pulmonary disease related to remote tobacco use.  In the absence of symptoms, no further evaluation or treatment is needed.

## 2011-08-30 NOTE — Assessment & Plan Note (Signed)
Acceptable control of hyperlipidemia with maximal dose of atorvastatin, which will be continued.

## 2011-08-30 NOTE — Progress Notes (Signed)
Patient ID: [name], male   DOB: 1920/09/10, 78 y.o.   MRN: [id number] HPI: Scheduled return visit for this very pleasant octogenarian with a history of coronary artery disease and multiple cardiovascular risk factors.  Although activity is limited, he denies any exertional symptoms.  Recently, he has been most troubled by sinus drainage, perhaps representing a upper respiratory infection.  He has not recently required urgent medical care or been seen in the emergency department or hospital.  Prior to Admission medications   Medication Sig Start Date End Date Taking? Authorizing Provider  amLODipine (NORVASC) 5 MG tablet Take 0.5 tablets (2.5 mg total) by mouth daily. 05/31/11 05/30/12 Yes Gerrit Friends. Hadlie Gipson, MD  atorvastatin (LIPITOR) 80 MG tablet Take 1 tablet (80 mg total) by mouth daily. 05/31/11 05/30/12 Yes Gerrit Friends. Gevin Perea, MD  carbidopa-levodopa (SINEMET) 25-100 MG per tablet Take 1 tablet by mouth daily.  02/08/11  Yes Historical Provider, MD  chlorthalidone (HYGROTON) 25 MG tablet Take 0.5 tablets (12.5 mg total) by mouth daily. 05/31/11 05/30/12 Yes Gerrit Friends. Elba Dendinger, MD  COMBIGAN 0.2-0.5 % ophthalmic solution  08/09/11  Yes Historical Provider, MD  donepezil (ARICEPT) 10 MG tablet Take 10 mg by mouth at bedtime as needed.  02/23/11  Yes Historical Provider, MD  latanoprost (XALATAN) 0.005 % ophthalmic solution Place 1 drop into both eyes daily.     Yes Historical Provider, MD  zolpidem (AMBIEN) 10 MG tablet Take 10 mg by mouth at bedtime as needed.  01/29/11  Yes Historical Provider, MD    Allergies  Allergen Reactions  . Shellfish Allergy   Past medical history, social history, and family history reviewed and updated.  ROS: Denies chest discomfort, dyspnea, orthopnea or PND.  No palpitations, lightheadedness or syncope.  PHYSICAL EXAM: BP 151/63  Pulse 59  Ht 5\' 5"  (1.651 m)  Wt 81.23 kg (179 lb 1.3 oz)  BMI 29.80 kg/m2; repeat blood pressure 125/50; 9 pound weight gain  since 02/2011 General-Well developed; no acute distress Body habitus-Mildly overweight Neck-No JVD; no carotid bruits Lungs-inspiratory and expiratory rhonchi; resonant to percussion Cardiovascular-normal PMI; normal S1 and S2 Abdomen-normal bowel sounds; soft and non-tender without masses or organomegaly Musculoskeletal-No deformities, no cyanosis or clubbing Neurologic-mild masked facies; halting speech with some difficulty finding words; minimal tremor; normal cranial nerves; symmetric strength and tone Skin-Warm, no significant lesions Extremities-1+ distal pulses; trace edema  ASSESSMENT AND PLAN:  [place] Bing, MD 08/30/2011 8:05 PM

## 2011-08-30 NOTE — Assessment & Plan Note (Signed)
Minor hematologic abnormalities without significant symptoms.

## 2011-09-03 ENCOUNTER — Other Ambulatory Visit: Payer: Self-pay | Admitting: *Deleted

## 2011-09-04 LAB — LIPID PANEL
HDL: 67 mg/dL (ref >39)
Total CHOL/HDL Ratio: 2.7 Ratio
Triglycerides: 130 mg/dL (ref <150)

## 2011-09-04 LAB — COMPREHENSIVE METABOLIC PANEL
AST: 20 U/L (ref 0–37)
BUN: 18 mg/dL (ref 6–23)
Calcium: 9.3 mg/dL (ref 8.4–10.5)
Chloride: 99 mEq/L (ref 96–112)
Creat: 1.27 mg/dL (ref 0.50–1.35)
Glucose, Bld: 138 mg/dL — ABNORMAL HIGH (ref 70–99)

## 2011-09-05 LAB — CBC
MCH: 29.9 pg (ref 26.0–34.0)
MCHC: 33.9 g/dL (ref 30.0–36.0)
MCV: 88.3 fL (ref 78.0–100.0)
Platelets: 133 10*3/uL — ABNORMAL LOW (ref 150–400)
RDW: 13.3 % (ref 11.5–15.5)
WBC: 4 10*3/uL (ref 4.0–10.5)

## 2012-02-25 HISTORY — PX: A-V CARDIAC PACEMAKER INSERTION: SHX562

## 2012-02-27 ENCOUNTER — Other Ambulatory Visit: Payer: Self-pay

## 2012-02-27 ENCOUNTER — Inpatient Hospital Stay (HOSPITAL_COMMUNITY)
Admission: EM | Admit: 2012-02-27 | Discharge: 2012-02-28 | DRG: 244 | Disposition: A | Discharge status: Home / Self Care | Payer: Medicare Other | Attending: Internal Medicine | Admitting: Internal Medicine

## 2012-02-27 ENCOUNTER — Encounter (HOSPITAL_COMMUNITY)
Admission: EM | Disposition: A | Discharge status: Home / Self Care | Payer: Self-pay | Source: Home / Self Care | Attending: Internal Medicine

## 2012-02-27 ENCOUNTER — Encounter (HOSPITAL_COMMUNITY): Payer: Self-pay | Admitting: *Deleted

## 2012-02-27 ENCOUNTER — Emergency Department (HOSPITAL_COMMUNITY): Payer: Medicare Other

## 2012-02-27 DIAGNOSIS — Z8673 Personal history of transient ischemic attack (TIA), and cerebral infarction without residual deficits: Secondary | ICD-10-CM

## 2012-02-27 DIAGNOSIS — R55 Syncope and collapse: Secondary | ICD-10-CM

## 2012-02-27 DIAGNOSIS — R001 Bradycardia, unspecified: Secondary | ICD-10-CM

## 2012-02-27 DIAGNOSIS — G4700 Insomnia, unspecified: Secondary | ICD-10-CM | POA: Diagnosis present

## 2012-02-27 DIAGNOSIS — I498 Other specified cardiac arrhythmias: Secondary | ICD-10-CM

## 2012-02-27 DIAGNOSIS — I10 Essential (primary) hypertension: Secondary | ICD-10-CM

## 2012-02-27 DIAGNOSIS — F0280 Dementia in other diseases classified elsewhere without behavioral disturbance: Secondary | ICD-10-CM | POA: Diagnosis present

## 2012-02-27 DIAGNOSIS — I739 Peripheral vascular disease, unspecified: Secondary | ICD-10-CM

## 2012-02-27 DIAGNOSIS — E785 Hyperlipidemia, unspecified: Secondary | ICD-10-CM

## 2012-02-27 DIAGNOSIS — G9001 Carotid sinus syncope: Secondary | ICD-10-CM

## 2012-02-27 DIAGNOSIS — G3183 Dementia with Lewy bodies: Secondary | ICD-10-CM | POA: Diagnosis present

## 2012-02-27 DIAGNOSIS — Z87891 Personal history of nicotine dependence: Secondary | ICD-10-CM

## 2012-02-27 DIAGNOSIS — Z950 Presence of cardiac pacemaker: Secondary | ICD-10-CM

## 2012-02-27 DIAGNOSIS — Z954 Presence of other heart-valve replacement: Secondary | ICD-10-CM

## 2012-02-27 DIAGNOSIS — I495 Sick sinus syndrome: Secondary | ICD-10-CM

## 2012-02-27 DIAGNOSIS — I2510 Atherosclerotic heart disease of native coronary artery without angina pectoris: Secondary | ICD-10-CM

## 2012-02-27 DIAGNOSIS — Z951 Presence of aortocoronary bypass graft: Secondary | ICD-10-CM

## 2012-02-27 DIAGNOSIS — G459 Transient cerebral ischemic attack, unspecified: Secondary | ICD-10-CM

## 2012-02-27 HISTORY — DX: Cardiac arrhythmia, unspecified: I49.9

## 2012-02-27 HISTORY — PX: PERMANENT PACEMAKER INSERTION: SHX5480

## 2012-02-27 HISTORY — DX: Shortness of breath: R06.02

## 2012-02-27 HISTORY — DX: Gastro-esophageal reflux disease without esophagitis: K21.9

## 2012-02-27 HISTORY — DX: Atherosclerotic heart disease of native coronary artery without angina pectoris: I25.10

## 2012-02-27 LAB — CBC WITH DIFFERENTIAL/PLATELET
Basophils Absolute: 0 10*3/uL (ref 0.0–0.1)
Eosinophils Absolute: 0 10*3/uL (ref 0.0–0.7)
Eosinophils Relative: 1 % (ref 0–5)
HCT: 37.5 % — ABNORMAL LOW (ref 39.0–52.0)
MCH: 29 pg (ref 26.0–34.0)
MCHC: 33.9 g/dL (ref 30.0–36.0)
MCV: 85.6 fL (ref 78.0–100.0)
Monocytes Absolute: 0.4 10*3/uL (ref 0.1–1.0)
Platelets: 115 10*3/uL — ABNORMAL LOW (ref 150–400)
RDW: 13.6 % (ref 11.5–15.5)

## 2012-02-27 LAB — URINALYSIS, ROUTINE W REFLEX MICROSCOPIC
Bilirubin Urine: NEGATIVE
Hgb urine dipstick: NEGATIVE
Ketones, ur: NEGATIVE mg/dL
Protein, ur: NEGATIVE mg/dL
Urobilinogen, UA: 1 mg/dL (ref 0.0–1.0)

## 2012-02-27 LAB — CBC
HCT: 38.2 % — ABNORMAL LOW (ref 39.0–52.0)
Hemoglobin: 13.3 g/dL (ref 13.0–17.0)
MCH: 29.9 pg (ref 26.0–34.0)
MCHC: 34.8 g/dL (ref 30.0–36.0)
MCV: 85.8 fL (ref 78.0–100.0)
RBC: 4.45 MIL/uL (ref 4.22–5.81)

## 2012-02-27 LAB — BASIC METABOLIC PANEL
Calcium: 9 mg/dL (ref 8.4–10.5)
Creatinine, Ser: 1.34 mg/dL (ref 0.50–1.35)
GFR calc non Af Amer: 46 mL/min — ABNORMAL LOW (ref >90)
Glucose, Bld: 229 mg/dL — ABNORMAL HIGH (ref 70–99)
Sodium: 137 mEq/L (ref 135–145)

## 2012-02-27 LAB — CARDIAC PANEL(CRET KIN+CKTOT+MB+TROPI)
CK, MB: 1.8 ng/mL (ref 0.3–4.0)
CK, MB: 2.1 ng/mL (ref 0.3–4.0)
Relative Index: 1.9 (ref 0.0–2.5)
Total CK: 103 U/L (ref 7–232)
Total CK: 113 U/L (ref 7–232)
Troponin I: <0.3 ng/mL (ref <0.30)

## 2012-02-27 LAB — URINE MICROSCOPIC-ADD ON

## 2012-02-27 LAB — CREATININE, SERUM: Creatinine, Ser: 1.11 mg/dL (ref 0.50–1.35)

## 2012-02-27 SURGERY — PERMANENT PACEMAKER INSERTION
Anesthesia: LOCAL

## 2012-02-27 MED ORDER — SODIUM CHLORIDE 0.9 % IJ SOLN
3.0000 mL | Freq: Two times a day (BID) | INTRAMUSCULAR | Status: DC
  Administered 2012-02-27: 3 mL via INTRAVENOUS

## 2012-02-27 MED ORDER — MIDAZOLAM HCL 5 MG/5ML IJ SOLN
INTRAMUSCULAR | Status: AC
  Filled 2012-02-27: qty 5

## 2012-02-27 MED ORDER — SODIUM CHLORIDE 0.9 % IV SOLN: 250.0000 mL | INTRAVENOUS | Status: DC | PRN

## 2012-02-27 MED ORDER — NITROGLYCERIN 0.4 MG SL SUBL: 0.4000 mg | SUBLINGUAL_TABLET | SUBLINGUAL | Status: DC | PRN

## 2012-02-27 MED ORDER — ONDANSETRON HCL 4 MG/2ML IJ SOLN: 4.0000 mg | Freq: Four times a day (QID) | INTRAMUSCULAR | Status: DC | PRN

## 2012-02-27 MED ORDER — SODIUM CHLORIDE 0.45 % IV SOLN
INTRAVENOUS | Status: DC
  Administered 2012-02-27: 16:00:00 via INTRAVENOUS

## 2012-02-27 MED ORDER — FENTANYL CITRATE 0.05 MG/ML IJ SOLN
INTRAMUSCULAR | Status: AC
  Filled 2012-02-27: qty 2

## 2012-02-27 MED ORDER — ACETAMINOPHEN 325 MG PO TABS
325.0000 mg | ORAL_TABLET | ORAL | Status: DC | PRN
  Administered 2012-02-28: 650 mg via ORAL
  Filled 2012-02-27: qty 2

## 2012-02-27 MED ORDER — CARBIDOPA-LEVODOPA 25-100 MG PO TABS
1.0000 | ORAL_TABLET | Freq: Two times a day (BID) | ORAL | Status: DC
  Administered 2012-02-27 – 2012-02-28 (×2): 1 via ORAL
  Filled 2012-02-27 (×3): qty 1

## 2012-02-27 MED ORDER — CEFAZOLIN SODIUM-DEXTROSE 2-3 GM-% IV SOLR
2.0000 g | INTRAVENOUS | Status: DC
  Filled 2012-02-27: qty 50

## 2012-02-27 MED ORDER — SODIUM CHLORIDE 0.9 % IJ SOLN: 3.0000 mL | INTRAMUSCULAR | Status: DC | PRN

## 2012-02-27 MED ORDER — DIAZEPAM 5 MG PO TABS
5.0000 mg | ORAL_TABLET | ORAL | Status: AC
  Administered 2012-02-27: 5 mg via ORAL
  Filled 2012-02-27: qty 1

## 2012-02-27 MED ORDER — AMLODIPINE BESYLATE 2.5 MG PO TABS
2.5000 mg | ORAL_TABLET | Freq: Every day | ORAL | Status: DC
  Administered 2012-02-28: 2.5 mg via ORAL
  Filled 2012-02-27: qty 1

## 2012-02-27 MED ORDER — LIDOCAINE HCL (PF) 1 % IJ SOLN
INTRAMUSCULAR | Status: AC
  Filled 2012-02-27: qty 60

## 2012-02-27 MED ORDER — SODIUM CHLORIDE 0.9 % IR SOLN
80.0000 mg | Status: DC
  Filled 2012-02-27: qty 2

## 2012-02-27 MED ORDER — CHLORTHALIDONE 25 MG PO TABS
12.5000 mg | ORAL_TABLET | Freq: Every day | ORAL | Status: DC
  Administered 2012-02-28: 12.5 mg via ORAL
  Filled 2012-02-27: qty 0.5

## 2012-02-27 MED ORDER — DONEPEZIL HCL 10 MG PO TABS
10.0000 mg | ORAL_TABLET | Freq: Every day | ORAL | Status: DC
  Administered 2012-02-27: 10 mg via ORAL
  Filled 2012-02-27 (×2): qty 1

## 2012-02-27 MED ORDER — SODIUM CHLORIDE 0.9 % IV SOLN: INTRAVENOUS | Status: DC

## 2012-02-27 MED ORDER — ASPIRIN EC 81 MG PO TBEC
81.0000 mg | DELAYED_RELEASE_TABLET | Freq: Every day | ORAL | Status: DC
  Administered 2012-02-28: 81 mg via ORAL
  Filled 2012-02-27: qty 1

## 2012-02-27 MED ORDER — HEPARIN (PORCINE) IN NACL 2-0.9 UNIT/ML-% IJ SOLN
INTRAMUSCULAR | Status: AC
  Filled 2012-02-27: qty 1000

## 2012-02-27 MED ORDER — ZOLPIDEM TARTRATE 5 MG PO TABS: 5.0000 mg | ORAL_TABLET | Freq: Every evening | ORAL | Status: DC | PRN

## 2012-02-27 MED ORDER — CEFAZOLIN SODIUM-DEXTROSE 2-3 GM-% IV SOLR
2.0000 g | Freq: Four times a day (QID) | INTRAVENOUS | Status: AC
  Administered 2012-02-27 – 2012-02-28 (×3): 2 g via INTRAVENOUS
  Filled 2012-02-27 (×3): qty 50

## 2012-02-27 MED ORDER — ONDANSETRON HCL 4 MG/2ML IJ SOLN
4.0000 mg | Freq: Once | INTRAMUSCULAR | Status: AC
  Administered 2012-02-27: 4 mg via INTRAVENOUS

## 2012-02-27 MED ORDER — ALPRAZOLAM 0.25 MG PO TABS: 0.2500 mg | ORAL_TABLET | Freq: Two times a day (BID) | ORAL | Status: DC | PRN

## 2012-02-27 MED ORDER — ACETAMINOPHEN 325 MG PO TABS: 650.0000 mg | ORAL_TABLET | ORAL | Status: DC | PRN

## 2012-02-27 MED ORDER — ATORVASTATIN CALCIUM 80 MG PO TABS
80.0000 mg | ORAL_TABLET | Freq: Every day | ORAL | Status: DC
  Administered 2012-02-27: 80 mg via ORAL
  Filled 2012-02-27 (×2): qty 1

## 2012-02-27 MED ORDER — ENOXAPARIN SODIUM 40 MG/0.4ML ~~LOC~~ SOLN
40.0000 mg | SUBCUTANEOUS | Status: DC
  Administered 2012-02-27: 40 mg via SUBCUTANEOUS
  Filled 2012-02-27 (×2): qty 0.4

## 2012-02-27 NOTE — ED Notes (Signed)
Pt daughter at bedside.  Reports that pt was sitting in a chair when he became unresponsive and turned blue.  Daughter denies seizure activity.  States that pt complained of his legs hurting prior to episode.  States that pt has been admitted to the hospital previously for low HR.

## 2012-02-27 NOTE — ED Notes (Signed)
[facility] is at the bedside

## 2012-02-27 NOTE — Care Management Note (Unsigned)
    Page 1 of 1   02/27/2012     4:43:50 PM   CARE MANAGEMENT NOTE 02/27/2012  Patient:  [name], [name]   Account Number:  [id number]  Date Initiated:  02/27/2012  Documentation initiated by:  GRAVES-BIGELOW,Janssen Zee  Subjective/Objective Assessment:   Pt admitted for syncope and bradycardia.     Action/Plan:   CM will continue to monitor for disposition needs.   Anticipated DC Date:  03/02/2012   Anticipated DC Plan:  HOME W HOME HEALTH SERVICES      DC Planning Services  CM consult      Choice offered to / List presented to:             Status of service:  In process, will continue to follow Medicare Important Message given?   (If response is "NO", the following Medicare IM given date fields will be blank) Date Medicare IM given:   Date Additional Medicare IM given:    Discharge Disposition:    Per UR Regulation:  Reviewed for med. necessity/level of care/duration of stay  If discussed at Long Length of Stay Meetings, dates discussed:    Comments:

## 2012-02-27 NOTE — H&P (Signed)
Cardiology Admission Note   Patient ID: DEQUAVION FOLLETTE MRN: [id number], DOB/AGE: 02-Sep-1920   Admit date: 02/27/2012 Date of Consult: 02/27/2012  Primary Physician: Corey Maudlin, MD Primary Cardiologist: Corey Bing, MD  Pt. Profile: [name] is a 78yo AA male with PMHx significant for Parkinson's disease, CAD (history noted below), aortic stenosis (s/p bioprosthetic AVR 1999), history of CLL, history of TIA, PVD, HTN, HL, history of syncope and beta blocker-induced sinus bradycardia who presents to [facility] ED with sinus bradycardia and syncope.   PAST CARDIAC HISTORY   2D ECHO 10/2007:  - Overall left ventricular systolic function was normal. Left ventricular ejection fraction was estimated , range being 55 % to 60 %. This study was inadequate for the evaluation of left ventricular regional wall motion. Left ventricular wall thickness was mildly increased. There was mild focal basal septal hypertrophy. - There was a bioprosthetic aortic valve. The mean transaortic valve gradient was 7 mmHg. - There was mild to moderate mitral annular calcification. There was mild mitral valvular regurgitation. - The left atrium was mildly dilated. There was an atrial septal aneurysm.   HPI:   The patient last followed up with [name] in 08/2011. He had noted to have a mild URI, but was stable from a cardiac perspective. Labs drawn then including CBC, BMET, lipid panel and TSH WNL.   He awoke this morning in his USOH. Got ready and ate breakfast. He did administer his eye drops this morning. He was preparing to go to an adult day care program. He was seated in the chair this morning. Stood up to button his shirt, became tachypneic and sat back down. His wife noticed shortly after that he was staring blankly and was slumped over. He was diaphoretic and cyanotic. His wife proceeded to arouse him and he responded. There was no appreciable LOC. He has had syncopal episodes in  the past shortly after arising, but no formal work-up. No facial droop, slurred speech, weakness, imbalance or incoordination was noticed. No extended travel. He does note leg cramping with some swelling (not one over the other). Last carotid doppler 01/2002- no significant stenosis. Noncontrast heat CT 06/2009- old lacunar infarcts, no acute process. Denies chest pain, lightheadedness, palpitations, n/v, fevers, chills, orthopnea, PND, new cough or active bleeding.  EMS arrived, and was unable to initially appreciate a radial pulse. Upon ED arrival, EKG reveals sinus bradycardia, first degree AV block, Q wave II, no ST-T changes. CXR without evidence of acute cardiopulmonary abnormalities (low lung volumes, mild bibasilar atelectasis). HR dropped to as low as 31 bpm. VSS. BMET, CBC and cardiac panel pending.   Problem List: Past Medical History  Diagnosis Date  . Arteriosclerotic cardiovascular disease (ASCVD)     CABG & bioprosthetic aortic valve replace in 1999. PCI of right coronary in 2000, catheter in 07/2002 revealed total occlusion of the LAD; LIMA-LAD patent; patent SVG-OM1, SVG-diagonal ramus, PDA occluded; patent Cx stent, distal 30-40% stenosis Cx; nondom RCA, prox RCA 20-30% lesion, distal RCA 90% lesion; normal EF  . TIA (transient ischemic attack)   . Hypertension     normal CMet and 2011  . Hyperlipidemia     Lipid profile in 12/2009-170, 73, 79, 76  . PVD (peripheral vascular disease)   . Parkinson disease 2010    mild; + dementia  . History of sinus bradycardia     induced by beta blocker  . History of syncope   . Chronic lymphatic leukemia  mild pancytopenia in 02/2010;hemoglobin-12.9, WBC-3.3, platelets-109  . Hepatitis   . Glaucoma 09/1994  . Tobacco abuse, in remission     20 pack years; discontinued 50 years ago  . Insomnia     Middle of the night awakening    Past Surgical History  Procedure Date  . Retinal detachment surgery 1989  . Tonsillectomy   .  Transurethral resection of prostate     Benign prostatic hypertrophy  . Coronary artery bypass graft 1999    + Bioprosthetic AVR  . Colonoscopy 2011     Allergies:  Allergies  Allergen Reactions  . Shellfish Allergy Anaphylaxis    Home Medications: Prior to Admission medications   Medication Sig Start Date End Date Taking? Authorizing Provider  amLODipine (NORVASC) 5 MG tablet Take 2.5 mg by mouth daily. 05/31/11  Yes Corey Brunswick, MD  atorvastatin (LIPITOR) 80 MG tablet Take 80 mg by mouth at bedtime. 05/31/11 05/30/12 Yes Corey Brunswick, MD  carbidopa-levodopa (SINEMET) 25-100 MG per tablet Take 1 tablet by mouth 2 (two) times daily.  02/08/11  Yes Historical Provider, MD  chlorthalidone (HYGROTON) 25 MG tablet Take 12.5 mg by mouth daily. 05/31/11  Yes Corey Brunswick, MD  COMBIGAN 0.2-0.5 % ophthalmic solution Place 1 drop into both eyes every 12 (twelve) hours.  08/09/11  Yes Historical Provider, MD  donepezil (ARICEPT) 10 MG tablet Take 10 mg by mouth at bedtime.  02/23/11  Yes Historical Provider, MD    Inpatient Medications:     . ondansetron (ZOFRAN) IV  4 mg Intravenous Once    (Not in a hospital admission)  Family History  Problem Relation Age of Onset  . Pneumonia Father   . Cancer Brother   . Breast cancer Sister   . Diabetes Sister     complications     History   Social History  . Marital Status: Married    Spouse Name: N/A    Number of Children: N/A  . Years of Education: N/A   Occupational History  . Not on file.   Social History Main Topics  . Smoking status: Never Smoker   . Smokeless tobacco: Not on file  . Alcohol Use: No  . Drug Use: No  . Sexually Active:    Other Topics Concern  . Not on file   Social History Narrative   Retired. Regularly exercises. Has 3 daughters alive and well.      Review of Systems: General: negative for chills, fever, night sweats or weight changes.  Cardiovascular: positive for edema, shortness of  breath, presyncope, negative for chest pain, dyspnea on exertion, edema, orthopnea, palpitations, paroxysmal nocturnal dyspnea Dermatological: negative for rash Respiratory:  negative for cough or wheezing Urologic: negative for hematuria Abdominal: negative for nausea, vomiting, diarrhea, bright red blood per rectum, melena, or hematemesis Neurologic: negative for visual changes, syncope All other systems reviewed and are otherwise negative except as noted above.  Physical Exam: Blood pressure 107/66, pulse 47, temperature 97.5 F (36.4 C), temperature source Oral, resp. rate 16, SpO2 100.00%.    General: Elderly, well developed, well nourished, in no acute distress. Skin: cool, moist, cap refill > 2 sec Head: Normocephalic, masked facies, atraumatic, sclera non-icteric, no xanthomas, nares are without discharge.  Neck: Negative for carotid bruits. JVD not elevated. Lungs: Clear bilaterally to auscultation without wheezes, rales, or rhonchi. Breathing is unlabored. Heart: Bradycardic, with clear S1 S2. No murmurs, rubs, or gallops appreciated. Abdomen: Soft, non-tender, non-distended with normoactive bowel sounds.  No hepatomegaly. No rebound/guarding. No obvious abdominal masses. Extremities: Trace bilateral pretibial edema. No clubbing or cyanosis.  Distal pedal pulses are 2+ and equal bilaterally. Neuro/msk: Resisted R elbow active flexion/extension 5/5 on R, 5/5 on L. Bilateral hand grip 5/5. R hip extension 5/5, 5/5 on L. Alert and oriented X 3. Moves all extremities spontaneously. Normal tone. Babinski down bilateral Psych:  Responds to questions appropriately with a flat affect.  Labs: Recent Labs  Basename 02/27/12 1055   WBC 5.6   HGB 12.7*   HCT 37.5*   MCV 85.6   PLT 115*   No results found for this basename: VITAMINB12,FOLATE,FERRITIN,TIBC,IRON,RETICCTPCT in the last 72 hours No results found for this basename: DDIMER:2 in the last 72 hours  Lab 02/27/12 1055  NA 137    K 4.4  CL 99  CO2 31  BUN 20  CREATININE 1.34  CALCIUM 9.0  PROT --  BILITOT --  ALKPHOS --  ALT --  AST --  AMYLASE --  LIPASE --  GLUCOSE 229*   No results found for this basename: HGBA1C in the last 72 hours Recent Labs  Basename 02/27/12 1055   CKTOTAL 103   CKMB 1.8   CKMBINDEX --   TROPONINI <0.30   No components found with this basename: POCBNP No results found for this basename: CHOL,HDL,LDLCALC,TRIG,CHOLHDL,LDLDIRECT in the last 72 hours No results found for this basename: TSH,T4TOTAL,FREET3,T3FREE,THYROIDAB in the last 72 hours  Radiology/Studies: Dg Chest Portable 1 View  02/27/2012  *RADIOLOGY REPORT*  Clinical Data: Irregular heart rate.  Syncope, diaphoresis and shortness of breath.  PORTABLE CHEST - 1 VIEW  Comparison: One of 11.  Findings: Trachea is midline.  Heart size is accentuated by AP semi upright technique and low lung volumes.  Mild bibasilar atelectasis.  No edema.  No pleural fluid.  IMPRESSION: Low lung volumes with mild bibasilar atelectasis.  Original Report Authenticated By: [name], M.D.    EKG: Sinus bradycardia, 48 bpm, Q wave II, no ST-T wave changes  ASSESSMENT:   1. Sinus bradycardia  2. Altered mental status 3. CAD 4. AS s/p porcine AVR 5. Parkinson's disease 6. PVD 7. History of CLL 8. HTN 9. HL 10. Carotid artery hypersensitivty with asystole on R carotid massage 11. 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 Additionally, he has a history of syncope upon standing quickly, with no formal prior work-up. He did take his eye drops containing timolol this AM. Additionally, patient is elderly, had stood up quickly and had shirt buttoned to top button this AM suspicious of orthostatic hypotension or carotid hypersensitivity, respectively. Would favor evaluating further with orthostatic VS and carotid  dopplers. Of note, patient does have CAD and PVD supporting potential for vasculopathy elsewhere. The patient was found slumped over, unresponsive, cyanotic and diaphoretic while seated a short time later. There was no apparent LOC. He has a history of TIA, but no prior CVA.HR in the ED low 30-high 40s. VSS stable here, but no radial pulse could be palpated by EMS on arrival (indicating SBP < 80). Temporally, sinus bradycardia could correlate to the patient's symptoms. Will review electrolytes, blood count and cardiac biomarkers. No chest pain associated with this. He had been stable from a cardiac standpoint on follow-up with [name] earlier this year. ACS less likely, but given history, will rule out. Would repeat 2D echo to reassess prosthetic AV function, EF and diastolic  function. Will hold eye drops. Will review HR on telemetry in the absence of this. Could progression from his Parkinson's disease be contributing to this picture? From an EP standpoint, not sure he would be a good candidate for PPM given age, comorbidities and limited activity level. Will discuss with MD.     Signed, R. Corey Horn, PA-C 02/27/2012, 12:09 PM   Attending. Patient seen and examined. D/w [name]. Agree with above. Patient has syncope in the setting of bradycardia with high-grade underlying conduction disease and severe carotid artery hypersensitivity with asystole. Will need PPM. EP to see. Check echo to reassess EF.  Corey Bensimhon,MD 12:40 PM

## 2012-02-27 NOTE — Interval H&P Note (Signed)
History and Physical Interval Note:  02/27/2012 5:31 PM  [name]  has presented today for surgery, with the diagnosis of Bradycardia  The various methods of treatment have been discussed with the patient and family. After consideration of risks, benefits and other options for treatment, the patient has consented to  Procedure(s) (LRB): PERMANENT PACEMAKER INSERTION (N/A) as a surgical intervention .  The patient's history has been reviewed, patient examined, no change in status, stable for surgery.  I have reviewed the patients' chart and labs.  Questions were answered to the patient's satisfaction.     [name]

## 2012-02-27 NOTE — Progress Notes (Signed)
UR Completed Brea Coleson Graves-Bigelow, RN,BSN [phone]  

## 2012-02-27 NOTE — Plan of Care (Signed)
Problem: Phase II Progression Outcomes Goal: Antibiotics sent with patient Outcome: Not Applicable Date Met:  02/27/12 Medication sent to cathlab from pharmacy Goal: Irrigation solution sent with patient Outcome: Not Applicable Date Met:  02/27/12 Medication sent to cath lab from pharmacy

## 2012-02-27 NOTE — ED Notes (Signed)
Patient is resting comfortably. 

## 2012-02-27 NOTE — Op Note (Signed)
DDD PPM inserted via the left subclavian vein without immediate complication. [id number].

## 2012-02-27 NOTE — ED Provider Notes (Signed)
History     CSN: [id number]  Arrival date & time      First MD Initiated Contact with Patient 02/27/12 0932      Chief Complaint  Patient presents with  . Bradycardia    (Consider location/radiation/quality/duration/timing/severity/associated sxs/prior treatment) The history is provided by the patient and the EMS personnel.  He is at home and getting ready to go to the gym to when he passed out. His wife apparently saw him sitting in the chair and he had a loss of consciousness fairly brief. EMS arrived and noted that he was very bradycardic and diaphoretic and they could not get a radial pulse. When they laid him down, blood pressure did come up a need alert and oriented. He did vomit on the way into the hospital. He has never had any episodes like this before. He denies chest pain, heaviness, tightness, pressure, dyspnea. [name] to complain of mild nausea.   Past Medical History  Diagnosis Date  . Arteriosclerotic cardiovascular disease (ASCVD)     CABG & bioprosthetic aortic valve replace in 1999. PCI of right coronary in 2000, catheter in 07/2002 revealed total insertion of the left anterior descending; pt LIMA graft; total obstruc of the saphenous vein graft to first marg w pt terminal limb between 1st & 2nd marginals;; pt circumflex stent; nondom RCA; normal EF  . TIA (transient ischemic attack)   . Hypertension     normal CMet and 2011  . Hyperlipidemia     Lipid profile in 12/2009-170, 73, 79, 76  . PVD (peripheral vascular disease)   . Parkinson disease 2010    mild; + dementia  . History of sinus bradycardia     induced by beta blocker  . History of syncope   . Chronic lymphatic leukemia     mild pancytopenia in 02/2010;hemoglobin-12.9, WBC-3.3, platelets-109  . Hepatitis   . Glaucoma 09/1994  . Tobacco abuse, in remission     20 pack years; discontinued 50 years ago  . Insomnia     Middle of the night awakening    Past Surgical History  Procedure Date  . Retinal  detachment surgery 1989  . Tonsillectomy   . Transurethral resection of prostate     Benign prostatic hypertrophy  . Coronary artery bypass graft 1999    + Bioprosthetic AVR  . Colonoscopy 2011    Family History  Problem Relation Age of Onset  . Pneumonia Father   . Cancer Brother   . Breast cancer Sister   . Diabetes Sister     complications    History  Substance Use Topics  . Smoking status: Never Smoker   . Smokeless tobacco: Not on file  . Alcohol Use: No      Review of Systems  All other systems reviewed and are negative.    Allergies  Shellfish allergy  Home Medications   Current Outpatient Rx  Name Route Sig Dispense Refill  . AMLODIPINE BESYLATE 5 MG PO TABS Oral Take 0.5 tablets (2.5 mg total) by mouth daily. 15 tablet 11  . ATORVASTATIN CALCIUM 80 MG PO TABS Oral Take 1 tablet (80 mg total) by mouth daily. 30 tablet 11  . CARBIDOPA-LEVODOPA 25-100 MG PO TABS Oral Take 1 tablet by mouth daily.     . CHLORTHALIDONE 25 MG PO TABS Oral Take 0.5 tablets (12.5 mg total) by mouth daily. 15 tablet 11  . COMBIGAN 0.2-0.5 % OP SOLN      . DONEPEZIL HCL 10 MG PO  TABS Oral Take 10 mg by mouth at bedtime as needed.     Marland Kitchen LATANOPROST 0.005 % OP SOLN Both Eyes Place 1 drop into both eyes daily.      Marland Kitchen ZOLPIDEM TARTRATE 10 MG PO TABS Oral Take 10 mg by mouth at bedtime as needed.       BP 151/57  Pulse 36  Physical Exam  Nursing note and vitals reviewed. -year-old male who is resting comfortably and in no acute distress. Vital signs are significant for bradycardia rate of 46, and prevention of blood pressure 151/57. Oxygen saturation is 100% which is normal. Head is normocephalic and atraumatic. PERRLA, EOMI. Arcus senilis is present. There is no facial asymmetry and tongue protrudes midline. Neck is nontender and supple without adenopathy, JVD, or bruit. Back is nontender. Lungs are clear without rales, wheezes, rhonchi. Heart has regular rate rhythm without murmur.  Abdomen is soft, flat, nontender without masses or hepatosplenomegaly. Extremities have 1+ edema, no cyanosis. Skin is warm and dry without rash. Neurologic: He is awake and mildly lethargic, but is easily aroused and oriented to person, place, time. Those are intact. There no motor or sensory deficits.   ED Course  Procedures (including critical care time)  Results for orders placed during the hospital encounter of 02/27/12  CBC WITH DIFFERENTIAL      Component Value Range   WBC 5.6  4.0 - 10.5 K/uL   RBC 4.38  4.22 - 5.81 MIL/uL   Hemoglobin 12.7 (*) 13.0 - 17.0 g/dL   HCT 16.1 (*) 09.6 - 04.5 %   MCV 85.6  78.0 - 100.0 fL   MCH 29.0  26.0 - 34.0 pg   MCHC 33.9  30.0 - 36.0 g/dL   RDW 40.9  81.1 - 91.4 %   Platelets 115 (*) 150 - 400 K/uL   Neutrophils Relative 66  43 - 77 %   Neutro Abs 3.7  1.7 - 7.7 K/uL   Lymphocytes Relative 26  12 - 46 %   Lymphs Abs 1.4  0.7 - 4.0 K/uL   Monocytes Relative 8  3 - 12 %   Monocytes Absolute 0.4  0.1 - 1.0 K/uL   Eosinophils Relative 1  0 - 5 %   Eosinophils Absolute 0.0  0.0 - 0.7 K/uL   Basophils Relative 0  0 - 1 %   Basophils Absolute 0.0  0.0 - 0.1 K/uL  BASIC METABOLIC PANEL      Component Value Range   Sodium 137  135 - 145 mEq/L   Potassium 4.4  3.5 - 5.1 mEq/L   Chloride 99  96 - 112 mEq/L   CO2 31  19 - 32 mEq/L   Glucose, Bld 229 (*) 70 - 99 mg/dL   BUN 20  6 - 23 mg/dL   Creatinine, Ser 7.82  0.50 - 1.35 mg/dL   Calcium 9.0  8.4 - 95.6 mg/dL   GFR calc non Af Amer 46 (*) >90 mL/min   GFR calc Af Amer 53 (*) >90 mL/min  CARDIAC PANEL(CRET KIN+CKTOT+MB+TROPI)      Component Value Range   Total CK 103  7 - 232 U/L   CK, MB 1.8  0.3 - 4.0 ng/mL   Troponin I <0.30  <0.30 ng/mL   Relative Index 1.7  0.0 - 2.5   Dg Chest Portable 1 View  02/27/2012  *RADIOLOGY REPORT*  Clinical Data: Irregular heart rate.  Syncope, diaphoresis and shortness of breath.  PORTABLE CHEST -  1 VIEW  Comparison: One of 11.  Findings: Trachea is  midline.  Heart size is accentuated by AP semi upright technique and low lung volumes.  Mild bibasilar atelectasis.  No edema.  No pleural fluid.  IMPRESSION: Low lung volumes with mild bibasilar atelectasis.  Original Report Authenticated By: Reyes Ivan, M.D.      Date: 02/27/2012  Rate: 48  Rhythm: sinus bradycardia  QRS Axis: left  Intervals: PR prolonged  ST/T Wave abnormalities: nonspecific T wave changes  Conduction Disutrbances:left anterior fascicular block and Incomplete right bundle-branch block  Narrative Interpretation: Left anterior fascicular block and right bundle branch block. T-wave flattening with minimal inversions in the anterolateral leads. When compared with ECG of 09/04/2009, incomplete right bundle-branch block is now present and nonspecific T-wave changes are now present. Rate has slowed by 12 beats per minute.  Old EKG Reviewed: changes noted    1. Carotid artery hypersensitivity   2. Bradycardia, severe sinus   3. Syncope    CRITICAL CARE Performed by: ZOXWR,UEAVW   Total critical care time: 35 minutes  Critical care time was exclusive of separately billable procedures and treating other patients.  Critical care was necessary to treat or prevent imminent or life-threatening deterioration.  Critical care was time spent personally by me on the following activities: development of treatment plan with patient and/or surrogate as well as nursing, discussions with consultants, evaluation of patient's response to treatment, examination of patient, obtaining history from patient or surrogate, ordering and performing treatments and interventions, ordering and review of laboratory studies, ordering and review of radiographic studies, pulse oximetry and re-evaluation of patient's condition.    MDM  Syncope with bradycardia. Heart rate has improved and blood pressure has improved. Some medications shows that he is on a beta blocker eyedrop which may be at  least part of the reason for his bradycardia. He will need to be admitted for observation if bradycardia does not resolve with holding his eyedrops, he may need to be considered for pacemaker insertion   Patient has been seen by Dr. Gala Romney who got the history that the patient passed out when his shirt was being buttoned by his wife. He also identified severe carotid sinus sensitivity. Patient will be admitted for pacemaker insertion.     Dione Booze, MD 02/27/12 1249

## 2012-02-27 NOTE — ED Notes (Signed)
Pt from home.  Syncopal episode at home, sitting in a chair-witnessed by wife.  Denies CP.  Pt A/O.  Pale, diaphoretic, no radial pulse on EMS arrival.  18RAC.

## 2012-02-27 NOTE — ED Notes (Addendum)
Pt A/O at this time.  Denies dizziness at this time, denies nausea.  Denies pain.

## 2012-02-27 NOTE — Interval H&P Note (Signed)
History and Physical Interval Note:  02/27/2012 4:31 PM  [name]  has presented today for surgery, with the diagnosis of Bradycardia  The various methods of treatment have been discussed with the patient and family. After consideration of risks, benefits and other options for treatment, the patient has consented to  Procedure(s) (LRB): PERMANENT PACEMAKER INSERTION (N/A) as a surgical intervention .  The patient's history has been reviewed, patient examined, no change in status, stable for surgery.  I have reviewed the patients' chart and labs.  Questions were answered to the patient's satisfaction.     [name]

## 2012-02-28 ENCOUNTER — Inpatient Hospital Stay (HOSPITAL_COMMUNITY): Payer: Medicare Other

## 2012-02-28 DIAGNOSIS — G9001 Carotid sinus syncope: Secondary | ICD-10-CM

## 2012-02-28 LAB — CBC
HCT: 36.4 % — ABNORMAL LOW (ref 39.0–52.0)
Hemoglobin: 12.4 g/dL — ABNORMAL LOW (ref 13.0–17.0)
MCH: 29.3 pg (ref 26.0–34.0)
MCHC: 34.1 g/dL (ref 30.0–36.0)
RBC: 4.23 MIL/uL (ref 4.22–5.81)

## 2012-02-28 LAB — BASIC METABOLIC PANEL
BUN: 18 mg/dL (ref 6–23)
Chloride: 99 mEq/L (ref 96–112)
GFR calc non Af Amer: 55 mL/min — ABNORMAL LOW (ref >90)
Glucose, Bld: 153 mg/dL — ABNORMAL HIGH (ref 70–99)
Potassium: 3.6 mEq/L (ref 3.5–5.1)
Sodium: 138 mEq/L (ref 135–145)

## 2012-02-28 MED ORDER — YOU HAVE A PACEMAKER BOOK
Freq: Once | Status: AC
  Administered 2012-02-28: 05:00:00
  Filled 2012-02-28: qty 1

## 2012-02-28 NOTE — Discharge Summary (Signed)
ELECTROPHYSIOLOGY DISCHARGE SUMMARY    Patient ID: [name],  MRN: [id number], DOB/AGE: May 21, 1921 78 y.o.  Admit date: 02/27/2012 Discharge date: 02/28/2012  Primary Care Physician: Corey Baars, MD Primary Cardiologist: Corey Pates, MD  Primary Discharge Diagnosis:  1. Symptomatic bradycardia with syncope s/p dual chamber PPM implantation  Secondary Discharge Diagnoses:  1. CAD 2. Aortic stenosis s/p bioprosthetic AVR 3. HTN 4. Dyslipidemia 5. PVD 6. TIA 7. Parkinson's disease 8. History of CLL  Procedures This Admission:  1. Dual chamber PPM implantation 02/27/2012 St. Jude model 2088 58-cm active fixation pacing lead, serial number [id number] was advanced into the right ventricle. The St. Jude model 2088T 52-cm active fixation pacing lead, serial number [id number] was advanced into the right atrium. St. Jude Accent DR RF dual-chamber pacemaker, serial number [id number].  History and Hospital Course:  [name] is a 78 year old man with PMHx significant for Parkinson's disease, CAD, aortic stenosis s/p bioprosthetic AVR 1999, CLL, TIA, PVD, HTN and dyslipidemia who presented yesterday to [facility] ED with sinus bradycardia and syncope. He was brought to the hospital yesterday after experiencing an episode of syncope while his daughter was helping him get dressed, specifically buttoning his shirt. 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 This resolved with termination of carotid massage. He was then referred for insertion of a dual-chamber pacemaker secondary to symptomatic carotid sinus hypersensitivity in the setting of underlying conduction system disease and trifascicular block. He underwent dual chamber PPM implantation 02/27/2012. He tolerated this procedure well without any immediate complications. He was observed overnight and has no complaints this AM. He remains hemodynamically  stable and afebrile. His device interrogation shows normal PPM function with stable lead measurements. His chest x-ray shows stable lead placement without pneumothorax. His implant site is intact without significant bleeding or hematoma. He has been seen, examined and deemed stable for discharge today by Dr. Ladona Levine. He has been given wound care and follow-up instructions.   Discharge Vitals: Blood pressure 132/59, pulse 60, temperature 98.8 F (37.1 C), temperature source Oral, resp. rate 18, height 5\' 6"  (1.676 m), weight 183 lb 13.8 oz (83.4 kg), SpO2 93.00%.   Labs: Lab Results  Component Value Date   WBC 4.3 02/28/2012   HGB 12.4* 02/28/2012   HCT 36.4* 02/28/2012   MCV 86.1 02/28/2012   PLT 104* 02/28/2012    Lab 02/28/12 0435  NA 138  K 3.6  CL 99  CO2 26  BUN 18  CREATININE 1.14  CALCIUM 8.7  PROT --  BILITOT --  ALKPHOS --  ALT --  AST --  GLUCOSE 153*   Lab Results  Component Value Date   CKTOTAL 107 02/28/2012   CKMB 1.8 02/28/2012   TROPONINI <0.30 02/28/2012    Disposition:  The patient is being discharged in stable condition.  Follow-up:  Follow up with [facility] in 10 days. (For wound check; Our office will call to let you know your appointment date and time)    Contact information:   [address]  [address] [place] [address] [phone]    Follow up with Corey Bunting, MD in 3 months. (Our office will call to let you know your appointment date and time)    Contact information:   [address]  [address] [place] [address] [phone]   Discharge Medications:  Medication List  As of 02/28/2012 10:10 AM   TAKE these medications     amlodipine 5 MG tablet   Commonly known as: NORVASC   Take 2.5 mg by mouth daily.      atorvastatin 80 MG tablet   Commonly known as: LIPITOR   Take 80 mg by mouth at bedtime.      carbidopa-levodopa 25-100 MG per tablet   Commonly known as: SINEMET IR   Take 1 tablet by  mouth 2 (two) times daily.      chlorthalidone 25 MG tablet   Commonly known as: HYGROTON   Take 12.5 mg by mouth daily.      combigan 0.2-0.5 % ophthalmic solution   Generic drug: brimonidine-timolol   Place 1 drop into both eyes every 12 (twelve) hours.      donepezil 10 MG tablet   Commonly known as: ARICEPT   Take 10 mg by mouth at bedtime.      Duration of Discharge Encounter: Greater than 30 minutes including physician time.  Signed, Corey Duff, PA-C 02/28/2012, 10:10 AM

## 2012-02-28 NOTE — Progress Notes (Signed)
Patient ID: [name], male   DOB: [birth year], 78 y.o.   MRN: [id number] Subjective:  Stable after PPM.  Objective:  Vital Signs in the last 24 hours: Temp:  [97.5 F (36.4 C)-98.8 F (37.1 C)] 98.8 F (37.1 C) (07/04 0400) Pulse Rate:  [36-66] 60  (07/04 0400) Resp:  [13-27] 18  (07/04 0400) BP: (107-174)/(44-76) 132/59 mmHg (07/04 0400) SpO2:  [93 %-100 %] 93 % (07/04 0400) Weight:  [183 lb 13.8 oz (83.4 kg)] 183 lb 13.8 oz (83.4 kg) (07/03 1409)  Intake/Output from previous day: 07/03 0701 - 07/04 0700 In: -  Out: 220 [Urine:220] Intake/Output from this shift:    Physical Exam: Well appearing elderly man, NAD HEENT: Unremarkable Neck:  no JVD, no thyromegally Lungs:  Clear with no wheezes. Well healed PPM incision. HEART:  Regular rate rhythm, no murmurs, no rubs, no clicks Abd:  Flat, positive bowel sounds, no organomegally, no rebound, no guarding Ext:  2 plus pulses, no edema, no cyanosis, no clubbing Skin:  No rashes no nodules Neuro:  CN II through XII intact, motor grossly intact  Lab Results:  Basename 02/28/12 0435 02/27/12 1611  WBC 4.3 4.8  HGB 12.4* 13.3  PLT 104* 107*    Basename 02/28/12 0435 02/27/12 1611 02/27/12 1055  NA 138 -- 137  K 3.6 -- 4.4  CL 99 -- 99  CO2 26 -- 31  GLUCOSE 153* -- 229*  BUN 18 -- 20  CREATININE 1.14 1.11 --    Basename 02/28/12 0435 02/27/12 2037  TROPONINI <0.30 <0.30   Hepatic Function Panel No results found for this basename: PROT,ALBUMIN,AST,ALT,ALKPHOS,BILITOT,BILIDIR,IBILI in the last 72 hours No results found for this basename: CHOL in the last 72 hours No results found for this basename: PROTIME in the last 72 hours  Imaging: Dg Chest 2 View  02/28/2012  *RADIOLOGY REPORT*  Clinical Data: Pacemaker insertion  CHEST - 2 VIEW  Comparison: 02/27/2012  Findings: Two lead left subclavian pacer insertion noted.  Previous coronary bypass changes.  No effusion or pneumothorax.  Negative for CHF.   IMPRESSION: No pneumothorax following left subclavian pacer insertion.  Original Report Authenticated By: Judie Petit. Ruel Favors, M.D.   Dg Chest Portable 1 View  02/27/2012  *RADIOLOGY REPORT*  Clinical Data: Irregular heart rate.  Syncope, diaphoresis and shortness of breath.  PORTABLE CHEST - 1 VIEW  Comparison: One of 11.  Findings: Trachea is midline.  Heart size is accentuated by AP semi upright technique and low lung volumes.  Mild bibasilar atelectasis.  No edema.  No pleural fluid.  IMPRESSION: Low lung volumes with mild bibasilar atelectasis.  Original Report Authenticated By: Reyes Ivan, M.D.    Cardiac Studies: Tele - NSR with A pacing Assessment/Plan:  1. Syncope 2. Symptomatic bradycardia due to carotid sinus hypersensitivity 3. S/p PPM Rec: ok to discharge home with usual follow up. No change in meds.  LOS: 1 day    Buel Ream.D. 02/28/2012, 9:15 AM

## 2012-02-28 NOTE — Progress Notes (Signed)
D/C ORDERS RECEIVED. PT STABLE WITH NO S/S OF DISTRESS. MEDICATION AND DISCHARGE INSTRUCTIONS REVIEWED WITH PT AND FAMILY. PT D/C HOME [name], [place] Marie

## 2012-02-28 NOTE — Op Note (Signed)
[name], [name] NO.:  [id number]  MEDICAL RECORD NO.:  [id number]  LOCATION:  3743                         FACILITY:  [facility]  PHYSICIAN:  Doylene Canning. Ladona Ridgel, MD    DATE OF BIRTH:  [birth year]  DATE OF PROCEDURE:  02/27/2012 DATE OF DISCHARGE:                              OPERATIVE REPORT   PROCEDURE PERFORMED:  Insertion of a dual-chamber pacemaker.  INDICATION:  Symptomatic bradycardia with syncope.  INTRODUCTION:  The patient is an 78 year old man who presented to the hospital today after experiencing an episode of syncope when his daughter was butting his shirt.  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  This resolved with termination of carotid massage.  He is now referred for insertion of a dual-chamber pacemaker secondary to symptomatic carotid sinus hypersensitivity in the setting of underlying conduction system disease and trifascicular block.  PROCEDURE:  After informed consent was obtained, the patient was taken to the Diagnostic EP Lab in the fasting state.  After usual preparation and draping, intravenous fentanyl and midazolam was given for sedation. A 30 mL of lidocaine was infiltrated into the left infraclavicular region.  A 5-cm incision was carried out over this region, and electrocautery was utilized to dissect down to the fascial plane.  The left subclavian vein was punctured x2 and the St. Jude model 2088 58-cm active fixation pacing lead, serial number [id number] was advanced into the right ventricle.  The St. Jude model 2088T 52-cm active fixation pacing lead, serial number [id number] was advanced into the right atrium. Mapping was carried out in the right ventricle.  At the final site, the R-wave was measured 14 mV, the pacing impedance was 700 ohms, and the threshold was a V at 0.5 msec.  The 10-V pacing did not stimulate the diaphragm.  There was  large injury current with active fixation of the lead.  With this lead in satisfactory position, attention was then turned to placement of the atrial lead.  The right atrium was very enlarged.  The mapping was carried out demonstrating P-waves of 3 mV on the lateral portion of the right atrium.  Multiple attempts to fixate the lead were difficult and resulted in dislodgement until the final site where pacing threshold was less than a V at 0.4 msec, the impedance was 400 ohms and stable and the P-waves measured 3 mV.  The lead was actively fixed.  The 10-V pacing did not stimulate the diaphragm in either the atrium or the ventricle.  With these satisfactory parameters, the leads were secured to the subpectoral fascia with a figure-of-eight silk suture.  Sewing sleeve was secured with silk suture. Electrocautery was utilized to make a subcutaneous pocket.  Antibiotic irrigation was utilized to irrigate the pocket, and electrocautery was utilized to assure hemostasis.  The St. Jude Accent DR RF dual-chamber pacemaker, serial number [id number] was connected to the atrial and the RV leads and placed back in the subcutaneous pocket.  The pocket was irrigated with antibiotic irrigation and the incision was closed with 2- 0 and 3-0 Vicryl.  Benzoin and Steri-Strips were painted on the skin, pressure  dressing was applied, and the patient was returned to his room in satisfactory condition.  COMPLICATIONS:  There were no immediate procedure complications.  RESULTS:  This demonstrate successful implantation of a St. Jude dual- chamber pacemaker.  The patient with symptomatic bradycardia and trifascicular block in the setting of complete heart block demonstrated by carotid sinus massage.     Doylene Canning. Ladona Ridgel, MD     GWT/MEDQ  D:  02/27/2012  T:  02/28/2012  Job:  [id number]  cc:   Bevelyn Buckles. Bensimhon, MD

## 2012-02-29 MED FILL — Ondansetron HCl Inj 4 MG/2ML (2 MG/ML): INTRAMUSCULAR | Qty: 2 | Status: AC

## 2012-03-12 ENCOUNTER — Ambulatory Visit (INDEPENDENT_AMBULATORY_CARE_PROVIDER_SITE_OTHER): Payer: Medicare Other | Admitting: *Deleted

## 2012-03-12 ENCOUNTER — Encounter: Payer: Self-pay | Admitting: Internal Medicine

## 2012-03-12 DIAGNOSIS — I495 Sick sinus syndrome: Secondary | ICD-10-CM

## 2012-03-12 DIAGNOSIS — R001 Bradycardia, unspecified: Secondary | ICD-10-CM

## 2012-03-12 LAB — PACEMAKER DEVICE OBSERVATION
BAMS-0001: 170 {beats}/min
RV LEAD THRESHOLD: 0.625 V

## 2012-03-12 NOTE — Progress Notes (Signed)
Wound check-PPM 

## 2012-05-05 ENCOUNTER — Encounter: Payer: Self-pay | Admitting: Cardiology

## 2012-05-05 ENCOUNTER — Ambulatory Visit (INDEPENDENT_AMBULATORY_CARE_PROVIDER_SITE_OTHER): Payer: Medicare Other | Admitting: Cardiology

## 2012-05-05 VITALS — BP 120/58 | HR 64 | Ht 65.0 in | Wt 186.0 lb

## 2012-05-05 DIAGNOSIS — E782 Mixed hyperlipidemia: Secondary | ICD-10-CM

## 2012-05-05 DIAGNOSIS — R001 Bradycardia, unspecified: Secondary | ICD-10-CM

## 2012-05-05 DIAGNOSIS — I10 Essential (primary) hypertension: Secondary | ICD-10-CM

## 2012-05-05 DIAGNOSIS — I495 Sick sinus syndrome: Secondary | ICD-10-CM

## 2012-05-05 DIAGNOSIS — G4700 Insomnia, unspecified: Secondary | ICD-10-CM

## 2012-05-05 NOTE — Progress Notes (Signed)
Patient ID: [name], male   DOB: 1921/04/23, 78 y.o.   MRN: [id number]  HPI: Scheduled return visit for this very nice gentleman with long history of coronary artery disease, bioprosthetic aortic valve replacement 15 years ago and recent pacemaker implantation for symptomatic bradycardia.  He continues to do astoundingly well with no cardiopulmonary symptoms and a fairly sedentary lifestyle.  He has experienced no lightheadedness nor syncope since pacemaker was placed.  The wound has healed completely, and there is no residual tenderness, swelling or discoloration.  Prior to Admission medications   Medication Sig Start Date End Date Taking? Authorizing Provider  amLODipine (NORVASC) 5 MG tablet Take 2.5 mg by mouth daily. 05/31/11  Yes Kathlen Brunswick, MD  atorvastatin (LIPITOR) 80 MG tablet Take 80 mg by mouth at bedtime. 05/31/11 05/30/12 Yes Kathlen Brunswick, MD  carbidopa-levodopa (SINEMET) 25-100 MG per tablet Take 1 tablet by mouth 2 (two) times daily.  02/08/11  Yes Historical Provider, MD  chlorthalidone (HYGROTON) 25 MG tablet Take 12.5 mg by mouth daily. 05/31/11  Yes Kathlen Brunswick, MD  COMBIGAN 0.2-0.5 % ophthalmic solution Place 1 drop into both eyes every 12 (twelve) hours.  08/09/11  Yes Historical Provider, MD  donepezil (ARICEPT) 10 MG tablet Take 10 mg by mouth at bedtime.  02/23/11  Yes Historical Provider, MD    Allergies  Allergen Reactions  . Shellfish Allergy Anaphylaxis      Past medical history, social history, and family history reviewed and updated.  ROS: Denies dyspnea, chest pain, orthopnea, PND or pedal edema.  No nausea, emesis or change in bowel habit.  He denies fever and rigors.  All other systems reviewed and are negative.  PHYSICAL EXAM: BP 120/58  Pulse 64  Ht 5\' 5"  (1.651 m)  Wt 84.369 kg (186 lb)  BMI 30.95 kg/m2  General-Well developed; no acute distress Body habitus-Mildly overweight Neck-No JVD; no carotid bruits Thorax-incision  completely healed; no swelling or tenderness Lungs-Marked inspiratory and expiratory rhonchi, which improved markedly after coughi; resonant to percussion Cardiovascular-normal PMI; normal S1 and S2 Abdomen-normal bowel sounds; soft and non-tender without masses or organomegaly Musculoskeletal-No deformities, no cyanosis or clubbing Neurologic-Normal cranial nerves; symmetric strength and tone Skin-Warm, no significant lesions Extremities-1+distal pulses; 1/2+ edema  ASSESSMENT AND PLAN:  Corey Bing, MD 05/05/2012 4:12 PM

## 2012-05-05 NOTE — Assessment & Plan Note (Addendum)
Control of blood pressure has been excellent with current medication, which will be continued.  Electrolytes will be monitored due to treatment with diuretic.

## 2012-05-05 NOTE — Progress Notes (Deleted)
Name: [name]    DOB: [birth year]  Age: 78 y.o.  MR#: [id number]       PCP:  Fredirick Maudlin, MD      Insurance: @PAYORNAME @   CC:   No chief complaint on file.   VS BP 120/58  Pulse 64  Ht 5\' 5"  (1.651 m)  Wt 186 lb (84.369 kg)  BMI 30.95 kg/m2  Weights Current Weight  05/05/12 186 lb (84.369 kg)  02/27/12 183 lb 13.8 oz (83.4 kg)  02/27/12 183 lb 13.8 oz (83.4 kg)    Blood Pressure  BP Readings from Last 3 Encounters:  05/05/12 120/58  02/28/12 132/59  02/28/12 132/59     Admit date:  (Not on file) Last encounter with RMR:  Visit date not found   Allergy Allergies  Allergen Reactions  . Shellfish Allergy Anaphylaxis    Current Outpatient Prescriptions  Medication Sig Dispense Refill  . amLODipine (NORVASC) 5 MG tablet Take 2.5 mg by mouth daily.      Marland Kitchen atorvastatin (LIPITOR) 80 MG tablet Take 80 mg by mouth at bedtime.      . carbidopa-levodopa (SINEMET) 25-100 MG per tablet Take 1 tablet by mouth 2 (two) times daily.       . chlorthalidone (HYGROTON) 25 MG tablet Take 12.5 mg by mouth daily.      . COMBIGAN 0.2-0.5 % ophthalmic solution Place 1 drop into both eyes every 12 (twelve) hours.       Marland Kitchen donepezil (ARICEPT) 10 MG tablet Take 10 mg by mouth at bedtime.       Marland Kitchen zolpidem (AMBIEN) 10 MG tablet         Discontinued Meds:   There are no discontinued medications.  Patient Active Problem List  Diagnosis  . ATHEROSCLEROTIC CARDIOVASCULAR DISEASE  . TIA  . PERIPHERAL VASCULAR DISEASE  . SYNCOPE, HX OF  . AORTIC VALVE REPLACEMENT, HX OF  . Tobacco abuse, in remission  . Parkinson disease  . Hypertension  . Hyperlipidemia  . Chronic lymphatic leukemia  . Hepatitis  . Glaucoma  . Insomnia  . Carotid artery hypersensitivity  . Bradycardia, severe sinus  . Syncope    LABS Clinical Support on 03/12/2012  Component Date Value  . DEVICE MODEL PM 03/12/2012 1191478   . [id number] 03/12/2012 [name]   M.D.   . Sherlon Handing 03/12/2012  [name]   M.D.   . PACEART TECH NOTES PM 03/12/2012                     Value:Wound check appointment. Steri-strips removed. Wound without redness or edema. Incision edges approximated, wound well healed. Normal device function. Thresholds, sensing, and impedances consistent with implant measurements. Device programmed at                          3.5V/auto capture programmed on for extra safety margin until 3 month visit. Histogram distribution appropriate for patient and level of activity. No mode switches or high ventricular rates noted. Patient educated about wound care, arm mobility, lifting                          restrictions. ROV in 3 months with implanting physician.  . ATRIAL PACING PM 03/12/2012 80   . VENTRICULAR PACING PM 03/12/2012 1.2   . BATTERY VOLTAGE 03/12/2012 2.95621   . AL IMPEDENCE PM 03/12/2012 525.0   . RV LEAD IMPEDENCE  PM 03/12/2012 525.0   . AL THRESHOLD 03/12/2012 0.625   . RV LEAD THRESHOLD 03/12/2012 0.625   . BAMS-0001 03/12/2012 170   . BAMS-0003 03/12/2012 70   Admission on 02/27/2012, Discharged on 02/28/2012  Component Date Value  . Color, Urine 02/27/2012 YELLOW   . APPearance 02/27/2012 CLEAR   . Specific Gravity, Urine 02/27/2012 1.020   . pH 02/27/2012 7.5   . Glucose, UA 02/27/2012 NEGATIVE   . Hgb urine dipstick 02/27/2012 NEGATIVE   . Bilirubin Urine 02/27/2012 NEGATIVE   . Ketones, ur 02/27/2012 NEGATIVE   . Protein, ur 02/27/2012 NEGATIVE   . Urobilinogen, UA 02/27/2012 1.0   . Nitrite 02/27/2012 NEGATIVE   . Leukocytes, UA 02/27/2012 TRACE*  . WBC 02/27/2012 5.6   . RBC 02/27/2012 4.38   . Hemoglobin 02/27/2012 12.7*  . HCT 02/27/2012 37.5*  . MCV 02/27/2012 85.6   . [facility] 02/27/2012 29.0   . MCHC 02/27/2012 33.9   . RDW 02/27/2012 13.6   . Platelets 02/27/2012 115*  . Neutrophils Relative 02/27/2012 66   . Neutro Abs 02/27/2012 3.7   . Lymphocytes Relative 02/27/2012 26   . Lymphs Abs 02/27/2012 1.4   . Monocytes Relative  02/27/2012 8   . Monocytes Absolute 02/27/2012 0.4   . Eosinophils Relative 02/27/2012 1   . Eosinophils Absolute 02/27/2012 0.0   . Basophils Relative 02/27/2012 0   . Basophils Absolute 02/27/2012 0.0   . Sodium 02/27/2012 137   . Potassium 02/27/2012 4.4   . Chloride 02/27/2012 99   . CO2 02/27/2012 31   . Glucose, Bld 02/27/2012 229*  . BUN 02/27/2012 20   . Creatinine, Ser 02/27/2012 1.34   . Calcium 02/27/2012 9.0   . GFR calc non Af Amer 02/27/2012 46*  . GFR calc Af Amer 02/27/2012 53*  . Total CK 02/27/2012 103   . CK, MB 02/27/2012 1.8   . Troponin I 02/27/2012 <0.30   . Relative Index 02/27/2012 1.7   . Total CK 02/27/2012 113   . CK, MB 02/27/2012 2.1   . Troponin I 02/27/2012 <0.30   . Relative Index 02/27/2012 1.9   . Total CK 02/28/2012 107   . CK, MB 02/28/2012 1.8   . Troponin I 02/28/2012 <0.30   . Relative Index 02/28/2012 1.7   . WBC 02/27/2012 4.8   . RBC 02/27/2012 4.45   . Hemoglobin 02/27/2012 13.3   . HCT 02/27/2012 38.2*  . MCV 02/27/2012 85.8   . [facility] 02/27/2012 29.9   . MCHC 02/27/2012 34.8   . RDW 02/27/2012 13.5   . Platelets 02/27/2012 107*  . Creatinine, Ser 02/27/2012 1.11   . GFR calc non Af Amer 02/27/2012 57*  . GFR calc Af Amer 02/27/2012 66*  . Sodium 02/28/2012 138   . Potassium 02/28/2012 3.6   . Chloride 02/28/2012 99   . CO2 02/28/2012 26   . Glucose, Bld 02/28/2012 153*  . BUN 02/28/2012 18   . Creatinine, Ser 02/28/2012 1.14   . Calcium 02/28/2012 8.7   . GFR calc non Af Amer 02/28/2012 55*  . GFR calc Af Amer 02/28/2012 64*  . WBC 02/28/2012 4.3   . RBC 02/28/2012 4.23   . Hemoglobin 02/28/2012 12.4*  . HCT 02/28/2012 36.4*  . MCV 02/28/2012 86.1   . [facility] 02/28/2012 29.3   . MCHC 02/28/2012 34.1   . RDW 02/28/2012 13.8   . Platelets 02/28/2012 104*  . Squamous Epithelial / LPF 02/27/2012 RARE   .  WBC, UA 02/27/2012 0-2   . RBC / HPF 02/27/2012 0-2   . Bacteria, UA 02/27/2012 RARE      Results for this Opt  Visit:     Results for orders placed in visit on 03/12/12  PACEMAKER DEVICE OBSERVATION      Component Value Range   DEVICE MODEL PM [id number]     [id number] [name]   M.D.     [id number] [name]   M.D.     [facility] NOTES PM       Value: Wound check appointment. Steri-strips removed. Wound without redness or edema. Incision edges approximated, wound well healed. Normal device function. Thresholds, sensing, and impedances consistent with implant measurements. Device programmed at      3.5V/auto capture programmed on for extra safety margin until 3 month visit. Histogram distribution appropriate for patient and level of activity. No mode switches or high ventricular rates noted. Patient educated about wound care, arm mobility, lifting      restrictions. ROV in 3 months with implanting physician.   ATRIAL PACING PM 80     VENTRICULAR PACING PM 1.2     BATTERY VOLTAGE 2.96287     AL IMPEDENCE PM 525.0     RV LEAD IMPEDENCE PM 525.0     AL THRESHOLD 0.625     RV LEAD THRESHOLD 0.625     BAMS-0001 170     BAMS-0003 70      EKG Orders placed during the hospital encounter of 02/27/12  . ED EKG  . ED EKG  . EKG 12-LEAD  . EKG 12-LEAD  . EKG 12-LEAD  . EKG 12-LEAD  . EKG 12-LEAD  . EKG     Prior Assessment and Plan Problem List as of 05/05/2012            Cardiology Problems   ATHEROSCLEROTIC CARDIOVASCULAR DISEASE   Last Assessment & Plan Note   08/30/2011 Office Visit Signed 08/30/2011  8:11 PM by Kathlen Brunswick, MD    No symptoms to suggest progression of coronary artery disease or symptomatic myocardial ischemia.  Our focus will continue to be on optimal control of cardiovascular risk factors.    TIA   PERIPHERAL VASCULAR DISEASE   AORTIC VALVE REPLACEMENT, HX OF   Last Assessment & Plan Note   08/30/2011 Office Visit Signed 08/30/2011  8:09 PM by Kathlen Brunswick, MD    Exam is benign with no evidence for valve dysfunction.  Echocardiography is not required in  the absence of symptoms or physical findings suggesting a problem.    Hypertension   Last Assessment & Plan Note   08/30/2011 Office Visit Signed 08/30/2011  8:12 PM by Kathlen Brunswick, MD    Blood pressure control is adequate with current medications, which will be continued.    Hyperlipidemia   Last Assessment & Plan Note   08/30/2011 Office Visit Signed 08/30/2011  8:16 PM by Kathlen Brunswick, MD    Acceptable control of hyperlipidemia with maximal dose of atorvastatin, which will be continued.    Carotid artery hypersensitivity   Bradycardia, severe sinus   Syncope     Other   SYNCOPE, HX OF   Tobacco abuse, in remission   Last Assessment & Plan Note   08/30/2011 Office Visit Addendum 09/02/2011 10:10 AM by Kathlen Brunswick, MD    Patient has physical findings of acute bronchospasm, which have not been present during past evaluations.  Most likely, this represents a viral upper  respiratory infection rather than chronic obstructive pulmonary disease related to remote tobacco use.  In the absence of symptoms, no further evaluation or treatment is needed.    Parkinson disease   Chronic lymphatic leukemia   Last Assessment & Plan Note   08/30/2011 Office Visit Signed 08/30/2011  8:11 PM by Kathlen Brunswick, MD    Minor hematologic abnormalities without significant symptoms.    Hepatitis   Glaucoma   Insomnia   Last Assessment & Plan Note   03/06/2011 Office Visit Addendum 03/16/2011  5:56 PM by Kathlen Brunswick, MD    Patient is taking moderate dose Ambien for this problem, which is associated with a high risk of significant adverse effects in this very elderly gentleman.  Tapering the dosage and using as infrequently as possible would likely decrease the risk of falls and exacerbation of the patient's baseline dementia.        Imaging: No results found.   FRS Calculation: Score not calculated

## 2012-05-05 NOTE — Assessment & Plan Note (Signed)
Middle of the night awakening; patient developed confusion with low-dose Ambien and is now utilizing no pharmacologic therapy.

## 2012-05-05 NOTE — Patient Instructions (Addendum)
Your physician recommends that you schedule a follow-up appointment in: 1 - 10 months with provider  Your physician recommends that you return for lab work in: Due the day you come in for appt with Dr Ladona Ridgel on 10/24

## 2012-06-10 ENCOUNTER — Other Ambulatory Visit: Payer: Self-pay | Admitting: Cardiology

## 2012-06-11 ENCOUNTER — Encounter: Payer: Self-pay | Admitting: *Deleted

## 2012-06-11 DIAGNOSIS — Z950 Presence of cardiac pacemaker: Secondary | ICD-10-CM | POA: Insufficient documentation

## 2012-06-11 LAB — COMPREHENSIVE METABOLIC PANEL
AST: 16 U/L (ref 0–37)
Albumin: 3.9 g/dL (ref 3.5–5.2)
Alkaline Phosphatase: 100 U/L (ref 39–117)
BUN: 20 mg/dL (ref 6–23)
Potassium: 4.7 mEq/L (ref 3.5–5.3)
Sodium: 141 mEq/L (ref 135–145)

## 2012-06-11 LAB — LIPID PANEL
HDL: 54 mg/dL (ref >39)
LDL Cholesterol: 85 mg/dL (ref 0–99)
VLDL: 35 mg/dL (ref 0–40)

## 2012-06-13 ENCOUNTER — Encounter: Payer: Self-pay | Admitting: Cardiology

## 2012-06-13 DIAGNOSIS — R7301 Impaired fasting glucose: Secondary | ICD-10-CM | POA: Insufficient documentation

## 2012-06-16 ENCOUNTER — Ambulatory Visit (INDEPENDENT_AMBULATORY_CARE_PROVIDER_SITE_OTHER): Payer: Medicare Other | Admitting: Internal Medicine

## 2012-06-16 ENCOUNTER — Encounter: Payer: Self-pay | Admitting: Internal Medicine

## 2012-06-16 VITALS — BP 110/62 | HR 60 | Ht 62.0 in | Wt 186.0 lb

## 2012-06-16 DIAGNOSIS — I2510 Atherosclerotic heart disease of native coronary artery without angina pectoris: Secondary | ICD-10-CM

## 2012-06-16 DIAGNOSIS — Z950 Presence of cardiac pacemaker: Secondary | ICD-10-CM

## 2012-06-16 DIAGNOSIS — I10 Essential (primary) hypertension: Secondary | ICD-10-CM

## 2012-06-16 DIAGNOSIS — I495 Sick sinus syndrome: Secondary | ICD-10-CM

## 2012-06-16 DIAGNOSIS — R001 Bradycardia, unspecified: Secondary | ICD-10-CM

## 2012-06-16 LAB — PACEMAKER DEVICE OBSERVATION
AL AMPLITUDE: 5 mv
AL IMPEDENCE PM: 475 Ohm
AL THRESHOLD: 0.75 V
BAMS-0003: 70 {beats}/min
RV LEAD AMPLITUDE: 12 mv
RV LEAD IMPEDENCE PM: 475 Ohm

## 2012-06-16 NOTE — Progress Notes (Signed)
HPI [name] returns today for followup. He is a very pleasant 78 year old man with symptomatic bradycardia, status post permanent pacemaker insertion.in the interim, he has done well. He only complains of mild swelling in his left foot. He denies chest pain, shortness of breath, or syncope. His appetite is good. Allergies  Allergen Reactions  . Shellfish Allergy Anaphylaxis     Current Outpatient Prescriptions  Medication Sig Dispense Refill  . amLODipine (NORVASC) 5 MG tablet Take 2.5 mg by mouth daily.      Marland Kitchen atorvastatin (LIPITOR) 80 MG tablet Take 80 mg by mouth at bedtime.      . carbidopa-levodopa (SINEMET) 25-100 MG per tablet Take 1 tablet by mouth 2 (two) times daily.       . chlorthalidone (HYGROTON) 25 MG tablet Take 12.5 mg by mouth daily.      . COMBIGAN 0.2-0.5 % ophthalmic solution Place 1 drop into both eyes every 12 (twelve) hours.       Marland Kitchen donepezil (ARICEPT) 10 MG tablet Take 10 mg by mouth at bedtime.       Marland Kitchen lisinopril-hydrochlorothiazide (PRINZIDE,ZESTORETIC) 20-12.5 MG per tablet Take 1 tablet by mouth daily.         Past Medical History  Diagnosis Date  . Arteriosclerotic cardiovascular disease (ASCVD)     CABG & bioprosthetic aortic valve replace in 1999. PCI of right coronary in 2000, catheter in 07/2002 revealed total occlusion of the LAD; LIMA-LAD patent; patent SVG-OM1, SVG-diagonal ramus, PDA occluded; patent Cx stent, distal 30-40% stenosis Cx; nondom RCA, prox RCA 20-30% lesion, distal RCA 90% lesion; normal EF  . TIA (transient ischemic attack)   . Hypertension     normal CMet and 2011  . Hyperlipidemia     Lipid profile in 12/2009-170, 73, 79, 76  . PVD (peripheral vascular disease)   . Parkinson disease 2010    mild; + dementia  . History of sinus bradycardia     induced by beta blocker  . History of syncope   . Chronic lymphatic leukemia     mild pancytopenia in 02/2010;hemoglobin-12.9, WBC-3.3, platelets-109  . Hepatitis   . Glaucoma(365)  09/1994  . Tobacco abuse, in remission     20 pack years; discontinued 50 years ago  . Insomnia     Middle of the night awakening  . Coronary artery disease   . Dysrhythmia     Bradycardia  . Shortness of breath   . GERD (gastroesophageal reflux disease)     ROS:   All systems reviewed and negative except as noted in the HPI.   Past Surgical History  Procedure Date  . Retinal detachment surgery 1989  . Tonsillectomy   . Transurethral resection of prostate     Benign prostatic hypertrophy  . Coronary artery bypass graft 1999    + Bioprosthetic AVR  . Colonoscopy 2011  . A-v cardiac pacemaker insertion 02/2012    St. Jude Accent DR RF     Family History  Problem Relation Age of Onset  . Pneumonia Father   . Cancer Brother   . Breast cancer Sister   . Diabetes Sister     complications     History   Social History  . Marital Status: Married    Spouse Name: N/A    Number of Children: N/A  . Years of Education: N/A   Occupational History  . Not on file.   Social History Main Topics  . Smoking status: Former Games developer  . Smokeless tobacco:  Never Used  . Alcohol Use: Yes     rare  . Drug Use: No  . Sexually Active: Not Currently   Other Topics Concern  . Not on file   Social History Narrative   Retired. Regularly exercises. Has 3 daughters alive and well.      BP 110/62  Pulse 60  Ht 5\' 2"  (1.575 m)  Wt 186 lb (84.369 kg)  BMI 34.02 kg/m2  SpO2 92%  Physical Exam:  Well appearing elderly man, who looks younger than his stated age, NAD HEENT: Unremarkable Neck:  No JVD, no thyromegally Lungs:  Clear with no wheezes, rales, or rhonchi. Well-healed pacemaker incision. HEART:  Regular rate rhythm, no murmurs, no rubs, no clicks Abd:  soft, positive bowel sounds, no organomegally, no rebound, no guarding Ext:  2 plus pulses, no edema, no cyanosis, no clubbing Skin:  No rashes no nodules Neuro:  CN II through XII intact, motor grossly intact  DEVICE   Normal device function.  See PaceArt for details.   Assess/Plan:

## 2012-06-16 NOTE — Assessment & Plan Note (Signed)
His St. Jude's dual-chamber pacemaker is working normally. We'll plan to recheck in several months.

## 2012-06-16 NOTE — Assessment & Plan Note (Signed)
His blood pressure today is well controlled. He'll continue his current medical therapy, and maintain a low-sodium diet. 

## 2012-06-16 NOTE — Patient Instructions (Signed)
Your physician recommends that you schedule a follow-up appointment in: July 2014

## 2012-06-16 NOTE — Assessment & Plan Note (Signed)
He denies anginal symptoms. I have encouraged the patient to increase his physical activity. He will continue his current medical therapy.

## 2012-06-19 ENCOUNTER — Encounter: Payer: Medicare Other | Admitting: Internal Medicine

## 2012-08-01 ENCOUNTER — Other Ambulatory Visit: Payer: Self-pay | Admitting: Cardiology

## 2012-08-05 ENCOUNTER — Emergency Department (HOSPITAL_COMMUNITY): Payer: Medicare Other

## 2012-08-05 ENCOUNTER — Encounter (HOSPITAL_COMMUNITY): Payer: Self-pay | Admitting: *Deleted

## 2012-08-05 ENCOUNTER — Inpatient Hospital Stay (HOSPITAL_COMMUNITY)
Admission: EM | Admit: 2012-08-05 | Discharge: 2012-08-08 | DRG: 392 | Disposition: A | Discharge status: Home / Self Care | Payer: Medicare Other | Attending: Pulmonary Disease | Admitting: Pulmonary Disease

## 2012-08-05 ENCOUNTER — Other Ambulatory Visit: Payer: Self-pay

## 2012-08-05 DIAGNOSIS — I509 Heart failure, unspecified: Secondary | ICD-10-CM | POA: Diagnosis present

## 2012-08-05 DIAGNOSIS — G20 Parkinson's disease: Secondary | ICD-10-CM | POA: Diagnosis present

## 2012-08-05 DIAGNOSIS — G4700 Insomnia, unspecified: Secondary | ICD-10-CM | POA: Diagnosis present

## 2012-08-05 DIAGNOSIS — G459 Transient cerebral ischemic attack, unspecified: Secondary | ICD-10-CM | POA: Diagnosis present

## 2012-08-05 DIAGNOSIS — R7301 Impaired fasting glucose: Secondary | ICD-10-CM | POA: Diagnosis present

## 2012-08-05 DIAGNOSIS — K5792 Diverticulitis of intestine, part unspecified, without perforation or abscess without bleeding: Secondary | ICD-10-CM | POA: Diagnosis present

## 2012-08-05 DIAGNOSIS — I739 Peripheral vascular disease, unspecified: Secondary | ICD-10-CM | POA: Diagnosis present

## 2012-08-05 DIAGNOSIS — G3183 Dementia with Lewy bodies: Secondary | ICD-10-CM | POA: Diagnosis present

## 2012-08-05 DIAGNOSIS — E785 Hyperlipidemia, unspecified: Secondary | ICD-10-CM | POA: Diagnosis present

## 2012-08-05 DIAGNOSIS — Z8673 Personal history of transient ischemic attack (TIA), and cerebral infarction without residual deficits: Secondary | ICD-10-CM

## 2012-08-05 DIAGNOSIS — R109 Unspecified abdominal pain: Secondary | ICD-10-CM

## 2012-08-05 DIAGNOSIS — G20A1 Parkinson's disease without dyskinesia, without mention of fluctuations: Secondary | ICD-10-CM | POA: Diagnosis present

## 2012-08-05 DIAGNOSIS — I10 Essential (primary) hypertension: Secondary | ICD-10-CM | POA: Diagnosis present

## 2012-08-05 DIAGNOSIS — F0280 Dementia in other diseases classified elsewhere without behavioral disturbance: Secondary | ICD-10-CM | POA: Diagnosis present

## 2012-08-05 DIAGNOSIS — I2510 Atherosclerotic heart disease of native coronary artery without angina pectoris: Secondary | ICD-10-CM | POA: Diagnosis present

## 2012-08-05 DIAGNOSIS — Z950 Presence of cardiac pacemaker: Secondary | ICD-10-CM | POA: Diagnosis present

## 2012-08-05 DIAGNOSIS — K5732 Diverticulitis of large intestine without perforation or abscess without bleeding: Principal | ICD-10-CM | POA: Diagnosis present

## 2012-08-05 DIAGNOSIS — Z79899 Other long term (current) drug therapy: Secondary | ICD-10-CM

## 2012-08-05 DIAGNOSIS — Z951 Presence of aortocoronary bypass graft: Secondary | ICD-10-CM

## 2012-08-05 DIAGNOSIS — Z87891 Personal history of nicotine dependence: Secondary | ICD-10-CM

## 2012-08-05 DIAGNOSIS — Z954 Presence of other heart-valve replacement: Secondary | ICD-10-CM

## 2012-08-05 DIAGNOSIS — C9110 Chronic lymphocytic leukemia of B-cell type not having achieved remission: Secondary | ICD-10-CM | POA: Diagnosis present

## 2012-08-05 LAB — URINALYSIS, ROUTINE W REFLEX MICROSCOPIC
Ketones, ur: NEGATIVE mg/dL
Leukocytes, UA: NEGATIVE
Nitrite: NEGATIVE
Protein, ur: NEGATIVE mg/dL
Urobilinogen, UA: 0.2 mg/dL (ref 0.0–1.0)

## 2012-08-05 LAB — TROPONIN I: Troponin I: <0.3 ng/mL (ref <0.30)

## 2012-08-05 LAB — CBC WITH DIFFERENTIAL/PLATELET
Basophils Absolute: 0 10*3/uL (ref 0.0–0.1)
Basophils Relative: 0 % (ref 0–1)
Eosinophils Absolute: 0 10*3/uL (ref 0.0–0.7)
Eosinophils Relative: 1 % (ref 0–5)
HCT: 34.7 % — ABNORMAL LOW (ref 39.0–52.0)
MCH: 30.1 pg (ref 26.0–34.0)
MCHC: 33.7 g/dL (ref 30.0–36.0)
MCV: 89.2 fL (ref 78.0–100.0)
Monocytes Absolute: 0.4 10*3/uL (ref 0.1–1.0)
RDW: 13.6 % (ref 11.5–15.5)

## 2012-08-05 LAB — COMPREHENSIVE METABOLIC PANEL
AST: 15 U/L (ref 0–37)
CO2: 26 mEq/L (ref 19–32)
Calcium: 9 mg/dL (ref 8.4–10.5)
Creatinine, Ser: 1.33 mg/dL (ref 0.50–1.35)
GFR calc non Af Amer: 46 mL/min — ABNORMAL LOW (ref >90)
Total Protein: 6.4 g/dL (ref 6.0–8.3)

## 2012-08-05 MED ORDER — PIPERACILLIN-TAZOBACTAM 3.375 G IVPB
INTRAVENOUS | Status: AC
  Filled 2012-08-05: qty 50

## 2012-08-05 MED ORDER — LISINOPRIL 10 MG PO TABS
20.0000 mg | ORAL_TABLET | Freq: Every day | ORAL | Status: DC
  Administered 2012-08-06 – 2012-08-08 (×3): 20 mg via ORAL
  Filled 2012-08-05 (×3): qty 2

## 2012-08-05 MED ORDER — NICOTINE 21 MG/24HR TD PT24
MEDICATED_PATCH | TRANSDERMAL | Status: AC
  Filled 2012-08-05: qty 1

## 2012-08-05 MED ORDER — PIPERACILLIN-TAZOBACTAM 3.375 G IVPB
3.3750 g | Freq: Once | INTRAVENOUS | Status: AC
  Administered 2012-08-05: 3.375 g via INTRAVENOUS
  Filled 2012-08-05: qty 50

## 2012-08-05 MED ORDER — ATORVASTATIN CALCIUM 40 MG PO TABS: 80.0000 mg | ORAL_TABLET | Freq: Every day | ORAL | Status: DC

## 2012-08-05 MED ORDER — PIPERACILLIN-TAZOBACTAM 3.375 G IVPB
3.3750 g | Freq: Three times a day (TID) | INTRAVENOUS | Status: DC
  Administered 2012-08-06 – 2012-08-07 (×4): 3.375 g via INTRAVENOUS
  Filled 2012-08-05 (×5): qty 50

## 2012-08-05 MED ORDER — CHLORTHALIDONE 25 MG PO TABS
12.5000 mg | ORAL_TABLET | Freq: Every day | ORAL | Status: DC
  Administered 2012-08-06 – 2012-08-08 (×2): 12.5 mg via ORAL
  Filled 2012-08-05 (×4): qty 1

## 2012-08-05 MED ORDER — HYDROCHLOROTHIAZIDE 12.5 MG PO CAPS
12.5000 mg | ORAL_CAPSULE | Freq: Every day | ORAL | Status: DC
  Administered 2012-08-06 – 2012-08-08 (×2): 12.5 mg via ORAL
  Filled 2012-08-05 (×3): qty 1

## 2012-08-05 MED ORDER — IOHEXOL 300 MG/ML  SOLN
100.0000 mL | Freq: Once | INTRAMUSCULAR | Status: AC | PRN
  Administered 2012-08-05: 100 mL via INTRAVENOUS

## 2012-08-05 MED ORDER — ATORVASTATIN CALCIUM 40 MG PO TABS
80.0000 mg | ORAL_TABLET | Freq: Every day | ORAL | Status: DC
  Administered 2012-08-06 – 2012-08-08 (×3): 80 mg via ORAL
  Filled 2012-08-05 (×3): qty 2

## 2012-08-05 MED ORDER — LISINOPRIL-HYDROCHLOROTHIAZIDE 20-12.5 MG PO TABS: 1.0000 | ORAL_TABLET | Freq: Every day | ORAL | Status: DC

## 2012-08-05 MED ORDER — METFORMIN HCL 500 MG PO TABS
ORAL_TABLET | ORAL | Status: AC
  Filled 2012-08-05: qty 1

## 2012-08-05 MED ORDER — DONEPEZIL HCL 5 MG PO TABS
10.0000 mg | ORAL_TABLET | Freq: Every day | ORAL | Status: DC
  Administered 2012-08-05 – 2012-08-07 (×3): 10 mg via ORAL
  Filled 2012-08-05 (×3): qty 2

## 2012-08-05 MED ORDER — CARBIDOPA-LEVODOPA 25-100 MG PO TABS
1.0000 | ORAL_TABLET | Freq: Two times a day (BID) | ORAL | Status: DC
  Administered 2012-08-05 – 2012-08-08 (×6): 1 via ORAL
  Filled 2012-08-05 (×12): qty 1

## 2012-08-05 MED ORDER — CARBIDOPA-LEVODOPA 25-100 MG PO TABS
ORAL_TABLET | ORAL | Status: AC
  Filled 2012-08-05: qty 1

## 2012-08-05 MED ORDER — SODIUM CHLORIDE 0.9 % IV SOLN
Freq: Once | INTRAVENOUS | Status: AC
  Administered 2012-08-05: 10:00:00 via INTRAVENOUS

## 2012-08-05 MED ORDER — BRIMONIDINE TARTRATE-TIMOLOL 0.2-0.5 % OP SOLN
1.0000 [drp] | Freq: Two times a day (BID) | OPHTHALMIC | Status: DC
  Filled 2012-08-05 (×7): qty 0.1

## 2012-08-05 MED ORDER — ENOXAPARIN SODIUM 40 MG/0.4ML ~~LOC~~ SOLN
40.0000 mg | SUBCUTANEOUS | Status: DC
  Administered 2012-08-05 – 2012-08-07 (×3): 40 mg via SUBCUTANEOUS
  Filled 2012-08-05 (×3): qty 0.4

## 2012-08-05 MED ORDER — AMLODIPINE BESYLATE 5 MG PO TABS
2.5000 mg | ORAL_TABLET | Freq: Every day | ORAL | Status: DC
  Administered 2012-08-06 – 2012-08-08 (×2): 2.5 mg via ORAL
  Filled 2012-08-05 (×3): qty 1

## 2012-08-05 NOTE — Progress Notes (Signed)
ANTIBIOTIC CONSULT NOTE - INITIAL  Pharmacy Consult for Zosyn Indication: illness of unknown origin  Allergies  Allergen Reactions  . Shellfish Allergy Anaphylaxis    Patient Measurements: Height: 5\' 8"  (172.7 cm) Weight: 188 lb 4.4 oz (85.4 kg) IBW/kg (Calculated) : 68.4   Vital Signs: Temp: 99.2 F (37.3 C) (12/10 2024) Temp src: Oral (12/10 2024) BP: 139/59 mmHg (12/10 2024) Pulse Rate: 63  (12/10 2024) Intake/Output from previous day:   Intake/Output from this shift:    Labs:  Basename 08/05/12 1007 08/05/12 0957  WBC -- 3.0*  HGB -- 11.7*  PLT -- 117*  LABCREA -- --  CREATININE 1.33 --   Estimated Creatinine Clearance: 40.1 ml/min (by C-G formula based on Cr of 1.33). No results found for this basename: VANCOTROUGH:2,VANCOPEAK:2,VANCORANDOM:2,GENTTROUGH:2,GENTPEAK:2,GENTRANDOM:2,TOBRATROUGH:2,TOBRAPEAK:2,TOBRARND:2,AMIKACINPEAK:2,AMIKACINTROU:2,AMIKACIN:2, in the last 72 hours   Microbiology: No results found for this or any previous visit (from the past 720 hour(s)).  Medical History: Past Medical History  Diagnosis Date  . Arteriosclerotic cardiovascular disease (ASCVD)     CABG & bioprosthetic aortic valve replace in 1999. PCI of right coronary in 2000, catheter in 07/2002 revealed total occlusion of the LAD; LIMA-LAD patent; patent SVG-OM1, SVG-diagonal ramus, PDA occluded; patent Cx stent, distal 30-40% stenosis Cx; nondom RCA, prox RCA 20-30% lesion, distal RCA 90% lesion; normal EF  . TIA (transient ischemic attack)   . Hypertension     normal CMet and 2011  . Hyperlipidemia     Lipid profile in 12/2009-170, 73, 79, 76  . PVD (peripheral vascular disease)   . Parkinson disease 2010    mild; + dementia  . History of sinus bradycardia     induced by beta blocker  . History of syncope   . Chronic lymphatic leukemia     mild pancytopenia in 02/2010;hemoglobin-12.9, WBC-3.3, platelets-109  . Hepatitis   . Glaucoma(365) 09/1994  . Tobacco abuse, in  remission     20 pack years; discontinued 50 years ago  . Insomnia     Middle of the night awakening  . Coronary artery disease   . Dysrhythmia     Bradycardia  . Shortness of breath   . GERD (gastroesophageal reflux disease)     Medications:  Scheduled:    . [COMPLETED] sodium chloride   Intravenous Once  . amLODipine  2.5 mg Oral Daily  . atorvastatin  80 mg Oral Daily  . brimonidine-timolol  1 drop Both Eyes Q12H  . carbidopa-levodopa  1 tablet Oral BID  . chlorthalidone  12.5 mg Oral Daily  . donepezil  10 mg Oral QHS  . lisinopril-hydrochlorothiazide  1 tablet Oral Daily  . [COMPLETED] piperacillin-tazobactam (ZOSYN)  IV  3.375 g Intravenous Once  . [DISCONTINUED] atorvastatin  80 mg Oral QHS   Assessment: 78 yo M admitted for observation and empiric antibiotic regimen of Zosyn.  Received Zosyn 3.375gm IV x1 in ED ~ 1830.  Renal function at patient's baseline.  Patient has low baseline platelet count. No bleeding noted.  Goal of Therapy:  Eradicate infection.  Plan:  1) Zosyn 3.375gm IV Q8h to be infused over 4hrs 2) Monitor renal function & pt progress 3) Lovenox 40mg  sq daily for VTE px 4) Monitor platelet count & s/sx of bleeding  Regie Bunner, [facility] 08/05/2012,9:12 PM

## 2012-08-05 NOTE — ED Provider Notes (Signed)
Assumed care from dr bonk at signout to f/u on ct imaging ?diverticulitis on CT imaging but no signs of perforation He has no RUQ tenderness on my exam to suggest occult cholecystitis His abdomen is soft but lower abd tenderness noted EKG/labs reviewed Will admit for OBS and IV antibtiotics Pt and family agreeable with this plan I spoke to dr Felecia Shelling on call for [facility] Pt stabilized in the ED BP 131/48  Pulse 65  Temp 97.4 F (36.3 C) (Oral)  Resp 17  SpO2 98%   Corey Gaskins, MD 08/05/12 [phone]

## 2012-08-05 NOTE — ED Provider Notes (Signed)
History    This chart was scribed for Corey Skene, MD, MD by Smitty Pluck, ED Scribe. The patient was seen in room APA16A and the patient's care was started at 9:47AM.   CSN: [id number]  Arrival date & time 08/05/12  5621      Chief Complaint  Patient presents with  . Altered Mental Status  . Diarrhea  . Emesis    [name] is level 5 caveat for altered mental status. The history is provided by the patient and the EMS personnel. No language interpreter was used.   [name] is a 78 y.o. male who presents to the Emergency Department BIB EMS due to being altered mental states and confusion this AM during breakfast at 8AM. Family reports that [name] had diarrhea last night and vomiting 2x this AM. He was given Zofran by EMS PTA. [name] reports that he has abdominal pain. Denies chest pain. [name] is aware of month and year and able to state them accurately. Patient does have Parkinson's with some mild dementia. Family says they brought him because he just didn't seem right. There is no history of shortness of breath, fevers, chills, painful urination.  Past Medical History  Diagnosis Date  . Arteriosclerotic cardiovascular disease (ASCVD)     CABG & bioprosthetic aortic valve replace in 1999. PCI of right coronary in 2000, catheter in 07/2002 revealed total occlusion of the LAD; LIMA-LAD patent; patent SVG-OM1, SVG-diagonal ramus, PDA occluded; patent Cx stent, distal 30-40% stenosis Cx; nondom RCA, prox RCA 20-30% lesion, distal RCA 90% lesion; normal EF  . TIA (transient ischemic attack)   . Hypertension     normal CMet and 2011  . Hyperlipidemia     Lipid profile in 12/2009-170, 73, 79, 76  . PVD (peripheral vascular disease)   . Parkinson disease 2010    mild; + dementia  . History of sinus bradycardia     induced by beta blocker  . History of syncope   . Chronic lymphatic leukemia     mild pancytopenia in 02/2010;hemoglobin-12.9, WBC-3.3, platelets-109  . Hepatitis   . Glaucoma(365)  09/1994  . Tobacco abuse, in remission     20 pack years; discontinued 50 years ago  . Insomnia     Middle of the night awakening  . Coronary artery disease   . Dysrhythmia     Bradycardia  . Shortness of breath   . GERD (gastroesophageal reflux disease)     Past Surgical History  Procedure Date  . Retinal detachment surgery 1989  . Tonsillectomy   . Transurethral resection of prostate     Benign prostatic hypertrophy  . Coronary artery bypass graft 1999    + Bioprosthetic AVR  . Colonoscopy 2011  . A-v cardiac pacemaker insertion 02/2012    St. Jude Accent DR RF    Family History  Problem Relation Age of Onset  . Pneumonia Father   . Cancer Brother   . Breast cancer Sister   . Diabetes Sister     complications    History  Substance Use Topics  . Smoking status: Former Games developer  . Smokeless tobacco: Never Used  . Alcohol Use: Yes     Comment: rare      Review of Systems Level V caveat patient's altered mental status/dementia  Allergies  Shellfish allergy  Home Medications   Current Outpatient Rx  Name  Route  Sig  Dispense  Refill  . AMLODIPINE BESYLATE 5 MG PO TABS   Oral  Take 2.5 mg by mouth daily.         . ATORVASTATIN CALCIUM 80 MG PO TABS   Oral   Take 80 mg by mouth at bedtime.         Marland Kitchen CARBIDOPA-LEVODOPA 25-100 MG PO TABS   Oral   Take 1 tablet by mouth 2 (two) times daily.          . CHLORTHALIDONE 25 MG PO TABS   Oral   Take 12.5 mg by mouth daily.         . CHLORTHALIDONE 25 MG PO TABS      TAKE 1/2 TABLET BY MOUTH EVERY DAY   15 tablet   3   . COMBIGAN 0.2-0.5 % OP SOLN   Both Eyes   Place 1 drop into both eyes every 12 (twelve) hours.          . DONEPEZIL HCL 10 MG PO TABS   Oral   Take 10 mg by mouth at bedtime.          Marland Kitchen LISINOPRIL-HYDROCHLOROTHIAZIDE 20-12.5 MG PO TABS   Oral   Take 1 tablet by mouth daily.           BP 131/59  Pulse 67  Temp 97.4 F (36.3 C) (Oral)  Resp 18  SpO2  96%  Physical Exam  Nursing note and vitals reviewed.  Nursing notes reviewed.  Electronic medical record reviewed. VITAL SIGNS:   Filed Vitals:   08/05/12 0937  BP: 131/59  Pulse: 67  Temp: 97.4 F (36.3 C)  TempSrc: Oral  Resp: 18  SpO2: 96%   CONSTITUTIONAL: Awake, oriented, appears non-toxic HENT: Atraumatic, normocephalic, oral mucosa pink and moist, airway patent. Nares patent without drainage. External ears normal. EYES: Conjunctiva clear, EOMI, PERRLA NECK: Trachea midline, non-tender, supple CARDIOVASCULAR: Normal heart rate, Normal rhythm, well healed median sternotomy scar. Left upper chest pacemaker in place. No tenderness to palpation in the chest.  PULMONARY/CHEST: Clear to auscultation, no rhonchi, wheezes, or rales. Symmetrical breath sounds. Non-tender. ABDOMINAL: Non-distended, soft, mildly tender in lower abdomen without rebound or guarding.  BS normal. NEUROLOGIC: Non-focal, moving all four extremities, no gross sensory or motor deficits. EXTREMITIES: No clubbing, cyanosis. 2+ lower extremity edema. SKIN: Warm, Dry, No erythema, No rash   ED Course  Procedures (including critical care time)  Date: 08/05/2012  Rate: 62  Rhythm: Atrial paced prolonged AV conduction  QRS Axis: Left axis deviation  Intervals: normal  ST/T Wave abnormalities: normal  Conduction Disutrbances: none  Narrative Interpretation: Nonischemic EKG-no significant change from 02/28/12  DIAGNOSTIC STUDIES: Oxygen Saturation is 96% on room air, normal by my interpretation.    COORDINATION OF CARE: 9:53 AM Discussed ED treatment with [name]  Labs Reviewed  CBC WITH DIFFERENTIAL - Abnormal; Notable for the following:    WBC 3.0 (*)     RBC 3.89 (*)     Hemoglobin 11.7 (*)     HCT 34.7 (*)     Platelets 117 (*)     Neutro Abs 1.5 (*)     Monocytes Relative 14 (*)     All other components within normal limits  COMPREHENSIVE METABOLIC PANEL - Abnormal; Notable for the following:     Glucose, Bld 208 (*)     Albumin 3.3 (*)     GFR calc non Af Amer 46 (*)     GFR calc Af Amer 53 (*)     All other components within normal limits  URINALYSIS, ROUTINE W  REFLEX MICROSCOPIC  LIPASE, BLOOD  TROPONIN I   Dg Chest Portable 1 View  08/05/2012  *RADIOLOGY REPORT*  Clinical Data: Altered mental status, emesis, diarrhea  PORTABLE CHEST - 1 VIEW  Comparison: 02/28/2012; 02/27/2012; 09/04/2009  Findings:  Grossly unchanged cardiac silhouette and mediastinal contours post median sternotomy and CABG.  Stable position of left anterior chest wall dual lead pacemaker.  There is persistent mild elevation of the right hemidiaphragm.  No focal parenchymal opacities.  No definite evidence of pulmonary edema or pleural effusion.  No pneumothorax.  Unchanged bones.  IMPRESSION: No acute cardiopulmonary disease.   Original Report Authenticated By: Tacey Ruiz, MD      No diagnosis found.    MDM  SIR MALLIS is a 78 y.o. male presenting with lower abdominal pain, diarrhea, and vomiting x2 this morning.  Presentation c/w AGE, patient and family poor historians as to the reason for ED presentation - "he just doesn't seem right."  [name] given some fluid and zofran, still having some mild - moderate abdominal pain.  Labs are unremarkable, vitals have been stable and WNL, [name] is non-toxic in appearance.  XR of chest is non-acute, no change on EKG and Trop is negative.  Workup further supports AGE, if feeling better and CT negative, may DC home - Dr. Bebe Shaggy to assume care of patient and final disposition.   I personally performed the services described in this documentation, which was scribed in my presence. The recorded information has been reviewed and is accurate. [name], M.D.        Corey Skene, MD 08/05/12 1645

## 2012-08-05 NOTE — ED Notes (Addendum)
Pt brought to er by [facility] with c/o altered loc, diarrhea and vomiting, ems advises that family reports that pt had diarrhea last night, vomiting X2 this am, pt confused to time, denies any pain, states "I don't feel good", pt given Zofran 4mg  IV by EMS prior to arrival. Per ems wife had noticed that pt was confused this am around breakfast time ?8am. According to EMS pt was having several episodes of PVC's, pt was placed on oxygen

## 2012-08-05 NOTE — ED Notes (Signed)
Patient in CT

## 2012-08-05 NOTE — H&P (Signed)
[name] MRN: [id number] DOB/AGE: [birth year] 78 y.o. Primary Care Physician:[name],EDWARD L, MD Admit date: 08/05/2012 Chief Complaint:  Change in mental status, vomiting and abdominal pain HPI:  This is an 78 years old male patient of Dr Juanetta Gosling who came to ER with the above complaints. Last night he had an episode of diarrhea. This morning after at breakfast he was confused and disoriented. He vomited twice and complained about abdominal pain. He was brought to Er and was evaluated. His Ct scan of the abdomen is suspicious of diverticulitis. Patient is empirically started on iv antibiotics and admitted for further treatment.  No fever, chills, cough,chest pain, dysuria, frequency of urination.   Past Medical History  Diagnosis Date  . Arteriosclerotic cardiovascular disease (ASCVD)     CABG & bioprosthetic aortic valve replace in 1999. PCI of right coronary in 2000, catheter in 07/2002 revealed total occlusion of the LAD; LIMA-LAD patent; patent SVG-OM1, SVG-diagonal ramus, PDA occluded; patent Cx stent, distal 30-40% stenosis Cx; nondom RCA, prox RCA 20-30% lesion, distal RCA 90% lesion; normal EF  . TIA (transient ischemic attack)   . Hypertension     normal CMet and 2011  . Hyperlipidemia     Lipid profile in 12/2009-170, 73, 79, 76  . PVD (peripheral vascular disease)   . Parkinson disease 2010    mild; + dementia  . History of sinus bradycardia     induced by beta blocker  . History of syncope   . Chronic lymphatic leukemia     mild pancytopenia in 02/2010;hemoglobin-12.9, WBC-3.3, platelets-109  . Hepatitis   . Glaucoma(365) 09/1994  . Tobacco abuse, in remission     20 pack years; discontinued 50 years ago  . Insomnia     Middle of the night awakening  . Coronary artery disease   . Dysrhythmia     Bradycardia  . Shortness of breath   . GERD (gastroesophageal reflux disease)    Past Surgical History  Procedure Date  . Retinal detachment surgery 1989  .  Tonsillectomy   . Transurethral resection of prostate     Benign prostatic hypertrophy  . Coronary artery bypass graft 1999    + Bioprosthetic AVR  . Colonoscopy 2011  . A-v cardiac pacemaker insertion 02/2012    St. Jude Accent DR RF        Family History  Problem Relation Age of Onset  . Pneumonia Father   . Cancer Brother   . Breast cancer Sister   . Diabetes Sister     complications    Social History:  reports that he has quit smoking. He has never used smokeless tobacco. He reports that he drinks alcohol. He reports that he does not use illicit drugs.   Allergies:  Allergies  Allergen Reactions  . Shellfish Allergy Anaphylaxis    Medications Prior to Admission  Medication Sig Dispense Refill  . amLODipine (NORVASC) 5 MG tablet Take 2.5 mg by mouth daily.      Marland Kitchen atorvastatin (LIPITOR) 80 MG tablet Take 80 mg by mouth daily.      . carbidopa-levodopa (SINEMET) 25-100 MG per tablet Take 1 tablet by mouth 2 (two) times daily.       . chlorthalidone (HYGROTON) 25 MG tablet Take 12.5 mg by mouth daily.      . COMBIGAN 0.2-0.5 % ophthalmic solution Place 1 drop into both eyes every 12 (twelve) hours.       Marland Kitchen donepezil (ARICEPT) 10 MG tablet Take 10 mg by  mouth at bedtime.       Marland Kitchen lisinopril-hydrochlorothiazide (PRINZIDE,ZESTORETIC) 20-12.5 MG per tablet Take 1 tablet by mouth daily.      Marland Kitchen atorvastatin (LIPITOR) 80 MG tablet Take 80 mg by mouth at bedtime.           ZOX:WRUEA from the symptoms mentioned above,there are no other symptoms referable to all systems reviewed.  Physical Exam: Blood pressure 139/59, pulse 63, temperature 99.2 F (37.3 C), temperature source Oral, resp. rate 20, height 5\' 8"  (1.727 m), weight 85.4 kg (188 lb 4.4 oz), SpO2 98.00%. General condition- alert awake, sick looking HE ENT- pupils equal and reactive, neck supple Respiratory- clear lung field CVS-S1 & S2 heard, no murmur or gallop ABD- soft and lax, bowel sound +, mild RLL  tenderness EXT- no leg edema    Basename 08/05/12 0957  WBC 3.0*  NEUTROABS 1.5*  HGB 11.7*  HCT 34.7*  MCV 89.2  PLT 117*    Basename 08/05/12 1007  NA 139  K 3.8  CL 103  CO2 26  GLUCOSE 208*  BUN 19  CREATININE 1.33  CALCIUM 9.0  MG --  lablast2(ast:2,ALT:2,alkphos:2,bilitot:2,prot:2,albumin:2)@    No results found for this or any previous visit (from the past 240 hour(s)).   Ct Abdomen Pelvis W Contrast  08/05/2012  *RADIOLOGY REPORT*  Clinical Data: Generalized abdominal and back pain.  History of high blood pressure, peripheral vascular disease, chronic lymphocytic leukemia, hepatitis, prostate surgery and reflux.  CT ABDOMEN AND PELVIS WITH CONTRAST  Technique:  Multidetector CT imaging of the abdomen and pelvis was performed following the standard protocol during bolus administration of intravenous contrast.  Contrast: OMNIPAQUE IOHEXOL 300 MG/ML  SOLN  Comparison: 12/31/2005 CT abdomen pelvis.  Findings: Lung bases clear.  Pacemaker in place and mild cardiomegaly.  Atherosclerotic type changes of the aorta, iliac arteries and aorta branch vessels with narrowing of the iliac arteries and femoral arteries.  No abdominal aortic aneurysm.  Mild narrowing celiac artery moderate narrowing superior mesenteric artery.  Dome of the liver not entirely included on present exam. Liver cysts are noted.  Within the upper aspect of the right lobe liver, there is a 1.2 cm low density structure which cannot be confirmed as a simple cyst and is new from prior examination.  No calcified gallstones.  Located between the gallbladder, inferior aspect of the liver, duodenum and the hepatic flexure of the colon is a small amount of fluid.  Etiology indeterminate.  This may be related to the diverticulitis of the hepatic flexure as there are scattered diverticular this region.  Primary gallbladder abnormality or duodenal abnormality are secondary considerations.  No free intraperitoneal air.   Low density sub centimeter splenic lesion unchanged.  Mild prominence pancreatic duct stable without mass identified. Calcified ectatic splenic artery.  No focal adrenal or renal lesion.  Degenerative changes lower thoracic and lumbar spine  Small amount of fluid in the right inguinal canal.  This fluid within the scrotum with calcifications.  Left inguinal canal fatty containing hernia.  Prior TURP.  Decompressed noncontrast filled views of the urinary bladder without gray  IMPRESSION: Located between the gallbladder, inferior aspect of the liver, duodenum and the hepatic flexure of the colon is a small amount of fluid.  Etiology indeterminate.  This may be related to the diverticulitis of the hepatic flexure as there are scattered diverticular this region.  Primary gallbladder abnormality or duodenal abnormality are secondary considerations.  Please see above for additional findings.  Original Report Authenticated By: Lacy Duverney, M.D.    Dg Chest Portable 1 View  08/05/2012  *RADIOLOGY REPORT*  Clinical Data: Altered mental status, emesis, diarrhea  PORTABLE CHEST - 1 VIEW  Comparison: 02/28/2012; 02/27/2012; 09/04/2009  Findings:  Grossly unchanged cardiac silhouette and mediastinal contours post median sternotomy and CABG.  Stable position of left anterior chest wall dual lead pacemaker.  There is persistent mild elevation of the right hemidiaphragm.  No focal parenchymal opacities.  No definite evidence of pulmonary edema or pleural effusion.  No pneumothorax.  Unchanged bones.  IMPRESSION: No acute cardiopulmonary disease.   Original Report Authenticated By: Tacey Ruiz, MD    Impression: 1. ? Diverticulitis 2. Change in Mental status etiology ? 3. Dementia 4. H/O Parkinson's disease 5. Hypertension Active Problems:  * No active hospital problems. *      Plan: We will admit Continue empiric IV antibiotics GI consult Continue regular treatment      Pieter Fooks  08/05/2012,  8:50 PM

## 2012-08-05 NOTE — ED Notes (Signed)
Pt now c/o abd pain,  

## 2012-08-05 NOTE — ED Notes (Signed)
Family here with pt, reports that pt had diarrhea last night, vomiting X2 this am and "just did not seem like himself" this am, pt able to answer questions, recognizes family members in room with pt

## 2012-08-05 NOTE — ED Notes (Signed)
Pt attempted to obtain urine sample without success.  

## 2012-08-06 DIAGNOSIS — R109 Unspecified abdominal pain: Secondary | ICD-10-CM

## 2012-08-06 DIAGNOSIS — K5732 Diverticulitis of large intestine without perforation or abscess without bleeding: Secondary | ICD-10-CM

## 2012-08-06 MED ORDER — BRIMONIDINE TARTRATE 0.2 % OP SOLN
1.0000 [drp] | Freq: Two times a day (BID) | OPHTHALMIC | Status: DC
  Administered 2012-08-06 – 2012-08-08 (×5): 1 [drp] via OPHTHALMIC
  Filled 2012-08-06: qty 5

## 2012-08-06 MED ORDER — TIMOLOL MALEATE 0.5 % OP SOLN
1.0000 [drp] | Freq: Two times a day (BID) | OPHTHALMIC | Status: DC
  Administered 2012-08-06 – 2012-08-08 (×5): 1 [drp] via OPHTHALMIC
  Filled 2012-08-06 (×2): qty 5

## 2012-08-06 NOTE — Care Management Note (Signed)
    Page 1 of 1   08/08/2012     11:11:54 AM   CARE MANAGEMENT NOTE 08/08/2012  Patient:  [name], [name]   Account Number:  [id number]  Date Initiated:  08/06/2012  Documentation initiated by:  Sharrie Rothman  Subjective/Objective Assessment:   Pt admitted from home with diverticulitis. Pt lives at home and has 2 daughters who provide around the clock care for their father. Pt does attend [employer] (adult daycare) in [place] M-F and rides the [employer] [place]. Pt has a cane.     Action/Plan:   Pt has no CM or HH needs. Pts daughter stated that pt would return to his normal routine and they will continue to provide 24 hour care. THey deny any HH services at this time.   Anticipated DC Date:  08/07/2012   Anticipated DC Plan:  HOME/SELF CARE      DC Planning Services  CM consult      Choice offered to / List presented to:             Status of service:  In process, will continue to follow Medicare Important Message given?  YES (If response is "NO", the following Medicare IM given date fields will be blank) Date Medicare IM given:  08/08/2012 Date Additional Medicare IM given:    Discharge Disposition:    Per UR Regulation:    If discussed at Long Length of Stay Meetings, dates discussed:    Comments:  08/08/12 1110 Arlyss Queen, RN BSN CM Pt potential discharge later today. CM spoke with family and they deny any HH or DME needs. Pt will continue to participate in his [employer] program daily.  08/06/12 1054 Arlyss Queen, RN BSN CM

## 2012-08-06 NOTE — Progress Notes (Signed)
Inpatient Diabetes Program Recommendations  AACE/ADA: New Consensus Statement on Inpatient Glycemic Control (2013)  Target Ranges:  Prepandial:   less than 140 mg/dL      Peak postprandial:   less than 180 mg/dL (1-2 hours)      Critically ill patients:  140 - 180 mg/dL   Results for [name], [name] (MRN [id number]) as of 08/06/2012 07:56  Ref. Range 08/05/2012 10:07  Glucose Latest Range: 70-99 mg/dL 409 (H)    Inpatient Diabetes Program Recommendations HgbA1C: May want to consider ordering an A1C.  Patient has no documented history of diabetes but glucose was elevated on initial labs.  Note: Initial lab glucose was 208 mg/dl.  In reviewing the chart, it does not appear that the patient received any type of steroids or dextrose prior to lab draw.  Patient does not have a documented history of diabetes. Therefore, may want to consider ordering an A1C to determine glycemic control over the past 2-3 months.  Will continue to follow if necessary.  Thanks, Orlando Penner, RN, BSN, CCRN Diabetes Coordinator Inpatient Diabetes Program [phone] - Phone [phone] - Dept Pager

## 2012-08-06 NOTE — Consult Note (Signed)
Reason for Consult:diverticulitis Referring Physician: Vikrant Pryce is an 78 y.o. male.  HPI: Admitted thru the ED yesterday.   He had labored breathing and abdominal pain. He also had diarrhea. He had approximately 3 loose stools. Denies blood stools.  He did vomit x 2 yesterday before going to the ED. He had umbilical pain.  There was no fever associated with his symptoms. No prior history of diverticulitis. Appetite has been good. No weight loss. He tells me his pain his better today.  Hx of CLL. No prior hx of colonoscopy.  Past Medical History  Diagnosis Date  . Arteriosclerotic cardiovascular disease (ASCVD)     CABG & bioprosthetic aortic valve replace in 1999. PCI of right coronary in 2000, catheter in 07/2002 revealed total occlusion of the LAD; LIMA-LAD patent; patent SVG-OM1, SVG-diagonal ramus, PDA occluded; patent Cx stent, distal 30-40% stenosis Cx; nondom RCA, prox RCA 20-30% lesion, distal RCA 90% lesion; normal EF  . TIA (transient ischemic attack)   . Hypertension     normal CMet and 2011  . Hyperlipidemia     Lipid profile in 12/2009-170, 73, 79, 76  . PVD (peripheral vascular disease)   . Parkinson disease 2010    mild; + dementia  . History of sinus bradycardia     induced by beta blocker  . History of syncope   . Chronic lymphatic leukemia     mild pancytopenia in 02/2010;hemoglobin-12.9, WBC-3.3, platelets-109  . Hepatitis   . Glaucoma(365) 09/1994  . Tobacco abuse, in remission     20 pack years; discontinued 50 years ago  . Insomnia     Middle of the night awakening  . Coronary artery disease   . Dysrhythmia     Bradycardia  . Shortness of breath   . GERD (gastroesophageal reflux disease)     Past Surgical History  Procedure Date  . Retinal detachment surgery 1989  . Tonsillectomy   . Transurethral resection of prostate     Benign prostatic hypertrophy  . Coronary artery bypass graft 1999    + Bioprosthetic AVR  . Colonoscopy 2011   . A-v cardiac pacemaker insertion 02/2012    [employer] Accent DR RF    Family History  Problem Relation Age of Onset  . Pneumonia Father   . Cancer Brother   . Breast cancer Sister   . Diabetes Sister     complications    Social History:  reports that he has quit smoking. He has never used smokeless tobacco. He reports that he drinks alcohol. He reports that he does not use illicit drugs.  Allergies:  Allergies  Allergen Reactions  . Shellfish Allergy Anaphylaxis    Medications: I have reviewed the patient's current medications.  Results for orders placed during the hospital encounter of 08/05/12 (from the past 48 hour(s))  CBC WITH DIFFERENTIAL     Status: Abnormal   Collection Time   08/05/12  9:57 AM      Component Value Range Comment   WBC 3.0 (*) 4.0 - 10.5 K/uL    RBC 3.89 (*) 4.22 - 5.81 MIL/uL    Hemoglobin 11.7 (*) 13.0 - 17.0 g/dL    HCT 16.1 (*) 09.6 - 52.0 %    MCV 89.2  78.0 - 100.0 fL    MCH 30.1  26.0 - 34.0 pg    MCHC 33.7  30.0 - 36.0 g/dL    RDW 04.5  40.9 - 81.1 %    Platelets 117 (*)  150 - 400 K/uL    Neutrophils Relative 52  43 - 77 %    Neutro Abs 1.5 (*) 1.7 - 7.7 K/uL    Lymphocytes Relative 33  12 - 46 %    Lymphs Abs 1.0  0.7 - 4.0 K/uL    Monocytes Relative 14 (*) 3 - 12 %    Monocytes Absolute 0.4  0.1 - 1.0 K/uL    Eosinophils Relative 1  0 - 5 %    Eosinophils Absolute 0.0  0.0 - 0.7 K/uL    Basophils Relative 0  0 - 1 %    Basophils Absolute 0.0  0.0 - 0.1 K/uL   COMPREHENSIVE METABOLIC PANEL     Status: Abnormal   Collection Time   08/05/12 10:07 AM      Component Value Range Comment   Sodium 139  135 - 145 mEq/L    Potassium 3.8  3.5 - 5.1 mEq/L    Chloride 103  96 - 112 mEq/L    CO2 26  19 - 32 mEq/L    Glucose, Bld 208 (*) 70 - 99 mg/dL    BUN 19  6 - 23 mg/dL    Creatinine, Ser 0.98  0.50 - 1.35 mg/dL    Calcium 9.0  8.4 - 11.9 mg/dL    Total Protein 6.4  6.0 - 8.3 g/dL    Albumin 3.3 (*) 3.5 - 5.2 g/dL    AST 15  0 - 37  U/L    ALT 6  0 - 53 U/L    Alkaline Phosphatase 105  39 - 117 U/L    Total Bilirubin 0.5  0.3 - 1.2 mg/dL    GFR calc non Af Amer 46 (*) >90 mL/min    GFR calc Af Amer 53 (*) >90 mL/min   LIPASE, BLOOD     Status: Normal   Collection Time   08/05/12 10:07 AM      Component Value Range Comment   Lipase 39  11 - 59 U/L   TROPONIN I     Status: Normal   Collection Time   08/05/12 10:07 AM      Component Value Range Comment   Troponin I <0.30  <0.30 ng/mL   URINALYSIS, ROUTINE W REFLEX MICROSCOPIC     Status: Normal   Collection Time   08/05/12  1:00 PM      Component Value Range Comment   Color, Urine YELLOW  YELLOW    APPearance CLEAR  CLEAR    Specific Gravity, Urine 1.015  1.005 - 1.030    pH 6.5  5.0 - 8.0    Glucose, UA NEGATIVE  NEGATIVE mg/dL    Hgb urine dipstick NEGATIVE  NEGATIVE    Bilirubin Urine NEGATIVE  NEGATIVE    Ketones, ur NEGATIVE  NEGATIVE mg/dL    Protein, ur NEGATIVE  NEGATIVE mg/dL    Urobilinogen, UA 0.2  0.0 - 1.0 mg/dL    Nitrite NEGATIVE  NEGATIVE    Leukocytes, UA NEGATIVE  NEGATIVE MICROSCOPIC NOT DONE ON URINES WITH NEGATIVE PROTEIN, BLOOD, LEUKOCYTES, NITRITE, OR GLUCOSE <1000 mg/dL.    Ct Abdomen Pelvis W Contrast  08/05/2012  *RADIOLOGY REPORT*  Clinical Data: Generalized abdominal and back pain.  History of high blood pressure, peripheral vascular disease, chronic lymphocytic leukemia, hepatitis, prostate surgery and reflux.  CT ABDOMEN AND PELVIS WITH CONTRAST  Technique:  Multidetector CT imaging of the abdomen and pelvis was performed following the standard protocol during bolus administration  of intravenous contrast.  Contrast: OMNIPAQUE IOHEXOL 300 MG/ML  SOLN  Comparison: 12/31/2005 CT abdomen pelvis.  Findings: Lung bases clear.  Pacemaker in place and mild cardiomegaly.  Atherosclerotic type changes of the aorta, iliac arteries and aorta branch vessels with narrowing of the iliac arteries and femoral arteries.  No abdominal aortic  aneurysm.  Mild narrowing celiac artery moderate narrowing superior mesenteric artery.  Dome of the liver not entirely included on present exam. Liver cysts are noted.  Within the upper aspect of the right lobe liver, there is a 1.2 cm low density structure which cannot be confirmed as a simple cyst and is new from prior examination.  No calcified gallstones.  Located between the gallbladder, inferior aspect of the liver, duodenum and the hepatic flexure of the colon is a small amount of fluid.  Etiology indeterminate.  This may be related to the diverticulitis of the hepatic flexure as there are scattered diverticular this region.  Primary gallbladder abnormality or duodenal abnormality are secondary considerations.  No free intraperitoneal air.  Low density sub centimeter splenic lesion unchanged.  Mild prominence pancreatic duct stable without mass identified. Calcified ectatic splenic artery.  No focal adrenal or renal lesion.  Degenerative changes lower thoracic and lumbar spine  Small amount of fluid in the right inguinal canal.  This fluid within the scrotum with calcifications.  Left inguinal canal fatty containing hernia.  Prior TURP.  Decompressed noncontrast filled views of the urinary bladder without gray  IMPRESSION: Located between the gallbladder, inferior aspect of the liver, duodenum and the hepatic flexure of the colon is a small amount of fluid.  Etiology indeterminate.  This may be related to the diverticulitis of the hepatic flexure as there are scattered diverticular this region.  Primary gallbladder abnormality or duodenal abnormality are secondary considerations.  Please see above for additional findings.   Original Report Authenticated By: Lacy Duverney, M.D.    Dg Chest Portable 1 View  08/05/2012  *RADIOLOGY REPORT*  Clinical Data: Altered mental status, emesis, diarrhea  PORTABLE CHEST - 1 VIEW  Comparison: 02/28/2012; 02/27/2012; 09/04/2009  Findings:  Grossly unchanged cardiac  silhouette and mediastinal contours post median sternotomy and CABG.  Stable position of left anterior chest wall dual lead pacemaker.  There is persistent mild elevation of the right hemidiaphragm.  No focal parenchymal opacities.  No definite evidence of pulmonary edema or pleural effusion.  No pneumothorax.  Unchanged bones.  IMPRESSION: No acute cardiopulmonary disease.   Original Report Authenticated By: Tacey Ruiz, MD     ROS Blood pressure 105/51, pulse 61, temperature 98.8 F (37.1 C), temperature source Oral, resp. rate 20, height 5\' 8"  (1.727 m), weight 188 lb 4.4 oz (85.4 kg), SpO2 97.00%. Physical ExamAlert and oriented. Skin warm and dry. Oral mucosa is moist.   . Sclera anicteric, conjunctivae is pink. Thyroid not enlarged. No cervical lymphadenopathy. Bilateral wheezes.  Heart regular rate and rhythm.  Abdomen is soft. Bowel sounds are positive. No hepatomegaly. No Very mild tenderness just right of the umbilicus.  No edema to lower extremities.  Assessment/Plan:Diverticulitis. Ct revealed a small amount of fluid between the gallbladder, inferior aspect of the liver, duodenum and the hepatic flexure of the colon. Will discuss with Dr. Karilyn Cota. Agree with Zosyn. Patient has never undergone a colonoscopy in the past per daughter and patient.   SETZER,TERRI W 08/06/2012, 8:33 AM     GI attending note; Patient interviewed and examined. History obtained from the patient and her daughter  Ms. Alfonse Alpers. His other daughter who stays with him is not here. Patient is feeling better and denies abdominal pain and his abdominal exam is unremarkable. Abdominal pelvic CT reviewed and it shows inflammatory changes in the region of hepatic flexure. He has multiple diverticula involving ascending colon descending and sigmoid colon. He did have colonoscopy along with EGD by Dr. Jena Gauss in June 2007 when he presented with iron deficiency anemia. Colonoscopy revealed colonic diverticulosis but he  has never experienced diverticulitis. Patient's acute illness is either do to diverticulitis, focal or segmental ischemic colitis or even infection since he did experience diarrhea at the onset of his symptoms. He seems to be improving with antibiotic therapy which should be continued. Will order stool studies. If he does well he will be switched to by mouth antibiotics in a.m.

## 2012-08-06 NOTE — Progress Notes (Signed)
UR Chart Review Completed  

## 2012-08-06 NOTE — Progress Notes (Signed)
Pt and pt's family requesting possibly advancing diet.  Dr. Juanetta Gosling notified.  GI records reviewed.  Orders received for full liquids and to continue to monitor.  Orders followed.

## 2012-08-07 LAB — CBC WITH DIFFERENTIAL/PLATELET
Basophils Absolute: 0 10*3/uL (ref 0.0–0.1)
Basophils Relative: 0 % (ref 0–1)
Eosinophils Relative: 1 % (ref 0–5)
HCT: 32.8 % — ABNORMAL LOW (ref 39.0–52.0)
Lymphocytes Relative: 49 % — ABNORMAL HIGH (ref 12–46)
MCHC: 33.8 g/dL (ref 30.0–36.0)
MCV: 88.4 fL (ref 78.0–100.0)
Monocytes Absolute: 0.5 10*3/uL (ref 0.1–1.0)
Platelets: 110 10*3/uL — ABNORMAL LOW (ref 150–400)
RDW: 13.6 % (ref 11.5–15.5)
WBC: 2.8 10*3/uL — ABNORMAL LOW (ref 4.0–10.5)

## 2012-08-07 LAB — HEMOGLOBIN A1C: Hgb A1c MFr Bld: 7.5 % — ABNORMAL HIGH (ref <5.7)

## 2012-08-07 MED ORDER — SODIUM CHLORIDE 0.9 % IV SOLN: INTRAVENOUS | Status: DC

## 2012-08-07 MED ORDER — METRONIDAZOLE 500 MG PO TABS
250.0000 mg | ORAL_TABLET | Freq: Three times a day (TID) | ORAL | Status: DC
  Administered 2012-08-07 – 2012-08-08 (×5): 250 mg via ORAL
  Filled 2012-08-07 (×5): qty 1

## 2012-08-07 MED ORDER — CIPROFLOXACIN HCL 250 MG PO TABS
500.0000 mg | ORAL_TABLET | Freq: Two times a day (BID) | ORAL | Status: DC
  Administered 2012-08-07 – 2012-08-08 (×3): 500 mg via ORAL
  Filled 2012-08-07 (×3): qty 2

## 2012-08-07 NOTE — Plan of Care (Signed)
Problem: Phase I Progression Outcomes Goal: OOB as tolerated unless otherwise ordered Outcome: Progressing Pt ambulating to bathroom.

## 2012-08-07 NOTE — Plan of Care (Signed)
Problem: Phase I Progression Outcomes Goal: Initial discharge plan identified Outcome: Completed/Met Date Met:  08/07/12 Plans to return home with daughters staying with him.  Continue adult day care.

## 2012-08-07 NOTE — Progress Notes (Signed)
Pt's blood pressure has been low today.  Dr. Juanetta Gosling called and notified of readings.  Received orders to hold the Norvasc, HCTZ and [place].  Dr. Juanetta Gosling ordered to give the Lisinopril d/t pt's history of CHF.  Orders followed.

## 2012-08-07 NOTE — Progress Notes (Addendum)
Patient ID: [name], male   DOB: [birth year], 78 y.o.   MRN: [id number] S. Says had a good night. Ate well at breakfast. His daughter says he had some diarrhea this am. No blood in stool. Filed Vitals:   08/06/12 1009 08/06/12 1445 08/06/12 2052 08/07/12 0624  BP: 112/59 94/55 115/69 106/66  Pulse: 65 68 62 81  Temp:  98.4 F (36.9 C) 98.6 F (37 C) 98.8 F (37.1 C)  TempSrc:  Oral Oral Oral  Resp:  20 20 20   Height:      Weight:      SpO2:  99% 99% 96%  Assessment: Diverticulitis. Patient has very little pain at this time.  He says he has very little abdominal pain.  Plan Cipro 500mg  BID x 10 days. Flagyl 250mg  TID x 10 days. IV KVO.   GI attending note; Patient is feeling much better and tolerating low residue diet. He has chronic leukopenia more pronounced during this admission. Will repeat CBC with differential in a.m. Since he is having loose stools we'll do stool cultures and O&P rule out and infection.

## 2012-08-07 NOTE — Progress Notes (Signed)
Subjective: He says he feels well. He has apparently had some more diarrhea. He may have had some blood in his stool but he's not sure.  Objective: Vital signs in last 24 hours: Temp:  [98.4 F (36.9 C)-98.8 F (37.1 C)] 98.8 F (37.1 C) (12/12 0624) Pulse Rate:  [62-81] 81  (12/12 0624) Resp:  [20] 20  (12/12 0624) BP: (94-115)/(55-69) 106/66 mmHg (12/12 0624) SpO2:  [96 %-99 %] 96 % (12/12 0624) Weight change:  Last BM Date: 08/06/12  Intake/Output from previous day: 12/11 0701 - 12/12 0700 In: 680 [P.O.:480; IV Piggyback:200] Out: 100 [Urine:100]  PHYSICAL EXAM General appearance: alert, cooperative and no distress Resp: clear to auscultation bilaterally Cardio: regular rate and rhythm, S1, S2 normal, no murmur, click, rub or gallop GI: Mildly tender diffusely Extremities: extremities normal, atraumatic, no cyanosis or edema  Lab Results:    Basic Metabolic Panel:  Basename 08/05/12 1007  NA 139  K 3.8  CL 103  CO2 26  GLUCOSE 208*  BUN 19  CREATININE 1.33  CALCIUM 9.0  MG --  PHOS --   Liver Function Tests:  Basename 08/05/12 1007  AST 15  ALT 6  ALKPHOS 105  BILITOT 0.5  PROT 6.4  ALBUMIN 3.3*    Basename 08/05/12 1007  LIPASE 39  AMYLASE --   No results found for this basename: AMMONIA:2 in the last 72 hours CBC:  Basename 08/07/12 0516 08/05/12 0957  WBC 2.8* 3.0*  NEUTROABS 0.9* 1.5*  HGB 11.1* 11.7*  HCT 32.8* 34.7*  MCV 88.4 89.2  PLT 110* 117*   Cardiac Enzymes:  Basename 08/05/12 1007  CKTOTAL --  CKMB --  CKMBINDEX --  TROPONINI <0.30   BNP: No results found for this basename: PROBNP:3 in the last 72 hours D-Dimer: No results found for this basename: DDIMER:2 in the last 72 hours CBG: No results found for this basename: GLUCAP:6 in the last 72 hours Hemoglobin A1C: No results found for this basename: HGBA1C in the last 72 hours Fasting Lipid Panel: No results found for this basename:  CHOL,HDL,LDLCALC,TRIG,CHOLHDL,LDLDIRECT in the last 72 hours Thyroid Function Tests: No results found for this basename: TSH,T4TOTAL,FREET4,T3FREE,THYROIDAB in the last 72 hours Anemia Panel: No results found for this basename: VITAMINB12,FOLATE,FERRITIN,TIBC,IRON,RETICCTPCT in the last 72 hours Coagulation: No results found for this basename: LABPROT:2,INR:2 in the last 72 hours Urine Drug Screen: Drugs of Abuse  No results found for this basename: labopia, cocainscrnur, labbenz, amphetmu, thcu, labbarb    Alcohol Level: No results found for this basename: ETH:2 in the last 72 hours Urinalysis:  Basename 08/05/12 1300  COLORURINE YELLOW  LABSPEC 1.015  PHURINE 6.5  GLUCOSEU NEGATIVE  HGBUR NEGATIVE  BILIRUBINUR NEGATIVE  KETONESUR NEGATIVE  PROTEINUR NEGATIVE  UROBILINOGEN 0.2  NITRITE NEGATIVE  LEUKOCYTESUR NEGATIVE   Misc. Labs:  ABGS No results found for this basename: PHART,PCO2,PO2ART,TCO2,HCO3 in the last 72 hours CULTURES No results found for this or any previous visit (from the past 240 hour(s)). Studies/Results: Ct Abdomen Pelvis W Contrast  08/05/2012  *RADIOLOGY REPORT*  Clinical Data: Generalized abdominal and back pain.  History of high blood pressure, peripheral vascular disease, chronic lymphocytic leukemia, hepatitis, prostate surgery and reflux.  CT ABDOMEN AND PELVIS WITH CONTRAST  Technique:  Multidetector CT imaging of the abdomen and pelvis was performed following the standard protocol during bolus administration of intravenous contrast.  Contrast: OMNIPAQUE IOHEXOL 300 MG/ML  SOLN  Comparison: 12/31/2005 CT abdomen pelvis.  Findings: Lung bases clear.  Pacemaker  in place and mild cardiomegaly.  Atherosclerotic type changes of the aorta, iliac arteries and aorta branch vessels with narrowing of the iliac arteries and femoral arteries.  No abdominal aortic aneurysm.  Mild narrowing celiac artery moderate narrowing superior mesenteric artery.  Dome of  the liver not entirely included on present exam. Liver cysts are noted.  Within the upper aspect of the right lobe liver, there is a 1.2 cm low density structure which cannot be confirmed as a simple cyst and is new from prior examination.  No calcified gallstones.  Located between the gallbladder, inferior aspect of the liver, duodenum and the hepatic flexure of the colon is a small amount of fluid.  Etiology indeterminate.  This may be related to the diverticulitis of the hepatic flexure as there are scattered diverticular this region.  Primary gallbladder abnormality or duodenal abnormality are secondary considerations.  No free intraperitoneal air.  Low density sub centimeter splenic lesion unchanged.  Mild prominence pancreatic duct stable without mass identified. Calcified ectatic splenic artery.  No focal adrenal or renal lesion.  Degenerative changes lower thoracic and lumbar spine  Small amount of fluid in the right inguinal canal.  This fluid within the scrotum with calcifications.  Left inguinal canal fatty containing hernia.  Prior TURP.  Decompressed noncontrast filled views of the urinary bladder without gray  IMPRESSION: Located between the gallbladder, inferior aspect of the liver, duodenum and the hepatic flexure of the colon is a small amount of fluid.  Etiology indeterminate.  This may be related to the diverticulitis of the hepatic flexure as there are scattered diverticular this region.  Primary gallbladder abnormality or duodenal abnormality are secondary considerations.  Please see above for additional findings.   Original Report Authenticated By: Lacy Duverney, M.D.    Dg Chest Portable 1 View  08/05/2012  *RADIOLOGY REPORT*  Clinical Data: Altered mental status, emesis, diarrhea  PORTABLE CHEST - 1 VIEW  Comparison: 02/28/2012; 02/27/2012; 09/04/2009  Findings:  Grossly unchanged cardiac silhouette and mediastinal contours post median sternotomy and CABG.  Stable position of left anterior  chest wall dual lead pacemaker.  There is persistent mild elevation of the right hemidiaphragm.  No focal parenchymal opacities.  No definite evidence of pulmonary edema or pleural effusion.  No pneumothorax.  Unchanged bones.  IMPRESSION: No acute cardiopulmonary disease.   Original Report Authenticated By: Tacey Ruiz, MD     Medications:  Prior to Admission:  Prescriptions prior to admission  Medication Sig Dispense Refill  . amLODipine (NORVASC) 5 MG tablet Take 2.5 mg by mouth daily.      Marland Kitchen atorvastatin (LIPITOR) 80 MG tablet Take 80 mg by mouth daily.      . carbidopa-levodopa (SINEMET) 25-100 MG per tablet Take 1 tablet by mouth 2 (two) times daily.       . chlorthalidone (HYGROTON) 25 MG tablet Take 12.5 mg by mouth daily.      . COMBIGAN 0.2-0.5 % ophthalmic solution Place 1 drop into both eyes every 12 (twelve) hours.       Marland Kitchen donepezil (ARICEPT) 10 MG tablet Take 10 mg by mouth at bedtime.       Marland Kitchen lisinopril-hydrochlorothiazide (PRINZIDE,ZESTORETIC) 20-12.5 MG per tablet Take 1 tablet by mouth daily.       Scheduled:   . amLODipine  2.5 mg Oral Daily  . atorvastatin  80 mg Oral Daily  . brimonidine  1 drop Both Eyes Q12H   And  . timolol  1 drop Both Eyes  Q12H  . carbidopa-levodopa  1 tablet Oral BID  . chlorthalidone  12.5 mg Oral Daily  . donepezil  10 mg Oral QHS  . enoxaparin (LOVENOX) injection  40 mg Subcutaneous Q24H  . lisinopril  20 mg Oral Daily   And  . hydrochlorothiazide  12.5 mg Oral Daily  . piperacillin-tazobactam (ZOSYN)  IV  3.375 g Intravenous Q8H   Continuous:  PRN:  Assesment: He has diverticulitis. He he seems to be doing well. He is tolerating full liquid diet. He apparently still has some diarrhea. He has coronary disease which is stable. He has some element of CHF and has chronic lymphocytic leukemia Active Problems:  * No active hospital problems. *     Plan: No change in treatments he may be able to switch to by mouth antibiotics and if so  he maybe will go home tomorrow    LOS: 2 days   Marquavious Nazar L 08/07/2012, 8:56 AM

## 2012-08-08 DIAGNOSIS — K5792 Diverticulitis of intestine, part unspecified, without perforation or abscess without bleeding: Secondary | ICD-10-CM | POA: Diagnosis present

## 2012-08-08 LAB — CBC WITH DIFFERENTIAL/PLATELET
Basophils Absolute: 0 10*3/uL (ref 0.0–0.1)
Eosinophils Relative: 2 % (ref 0–5)
Lymphocytes Relative: 48 % — ABNORMAL HIGH (ref 12–46)
Lymphs Abs: 1.5 10*3/uL (ref 0.7–4.0)
MCV: 88.7 fL (ref 78.0–100.0)
Neutro Abs: 0.9 10*3/uL — ABNORMAL LOW (ref 1.7–7.7)
Neutrophils Relative %: 31 % — ABNORMAL LOW (ref 43–77)
Platelets: 126 10*3/uL — ABNORMAL LOW (ref 150–400)
RBC: 3.72 MIL/uL — ABNORMAL LOW (ref 4.22–5.81)
RDW: 13.6 % (ref 11.5–15.5)
WBC: 3.1 10*3/uL — ABNORMAL LOW (ref 4.0–10.5)

## 2012-08-08 LAB — CLOSTRIDIUM DIFFICILE BY PCR: Toxigenic C. Difficile by PCR: NEGATIVE

## 2012-08-08 MED ORDER — CIPROFLOXACIN HCL 500 MG PO TABS: 500.0000 mg | ORAL_TABLET | Freq: Two times a day (BID) | ORAL | Status: DC

## 2012-08-08 MED ORDER — METRONIDAZOLE 250 MG PO TABS: 250.0000 mg | ORAL_TABLET | Freq: Three times a day (TID) | ORAL | Status: DC

## 2012-08-08 NOTE — Progress Notes (Signed)
Discharge instructions given to patient with all questions answered. Patient left in stable condition with daughter. Taken out of facility by staff via wheelchair.

## 2012-08-08 NOTE — Progress Notes (Signed)
Subjective: He says he feels better. He is having diarrhea but no abdominal pain  Objective: Vital signs in last 24 hours: Temp:  [98 F (36.7 C)-98.6 F (37 C)] 98 F (36.7 C) (12/13 1452) Pulse Rate:  [60-63] 60  (12/13 1452) Resp:  [20] 20  (12/13 1452) BP: (95-109)/(56-63) 95/56 mmHg (12/13 1452) SpO2:  [95 %-97 %] 95 % (12/13 1452) Weight change:  Last BM Date: 08/07/12  Intake/Output from previous day: 12/12 0701 - 12/13 0700 In: 980 [P.O.:980] Out: 400 [Urine:400]  PHYSICAL EXAM General appearance: alert, cooperative and no distress Resp: clear to auscultation bilaterally Cardio: regular rate and rhythm, S1, S2 normal, no murmur, click, rub or gallop GI: soft, non-tender; bowel sounds normal; no masses,  no organomegaly Extremities: extremities normal, atraumatic, no cyanosis or edema  Lab Results:    Basic Metabolic Panel: No results found for this basename: NA:2,K:2,CL:2,CO2:2,GLUCOSE:2,BUN:2,CREATININE:2,CALCIUM:2,MG:2,PHOS:2 in the last 72 hours Liver Function Tests: No results found for this basename: AST:2,ALT:2,ALKPHOS:2,BILITOT:2,PROT:2,ALBUMIN:2 in the last 72 hours No results found for this basename: LIPASE:2,AMYLASE:2 in the last 72 hours No results found for this basename: AMMONIA:2 in the last 72 hours CBC:  Basename 08/08/12 0441 08/07/12 0516  WBC 3.1* 2.8*  NEUTROABS 0.9* 0.9*  HGB 11.1* 11.1*  HCT 33.0* 32.8*  MCV 88.7 88.4  PLT 126* 110*   Cardiac Enzymes: No results found for this basename: CKTOTAL:3,CKMB:3,CKMBINDEX:3,TROPONINI:3 in the last 72 hours BNP: No results found for this basename: PROBNP:3 in the last 72 hours D-Dimer: No results found for this basename: DDIMER:2 in the last 72 hours CBG: No results found for this basename: GLUCAP:6 in the last 72 hours Hemoglobin A1C:  Basename 08/07/12 0516  HGBA1C 7.5*   Fasting Lipid Panel: No results found for this basename: CHOL,HDL,LDLCALC,TRIG,CHOLHDL,LDLDIRECT in the last 72  hours Thyroid Function Tests: No results found for this basename: TSH,T4TOTAL,FREET4,T3FREE,THYROIDAB in the last 72 hours Anemia Panel: No results found for this basename: VITAMINB12,FOLATE,FERRITIN,TIBC,IRON,RETICCTPCT in the last 72 hours Coagulation: No results found for this basename: LABPROT:2,INR:2 in the last 72 hours Urine Drug Screen: Drugs of Abuse  No results found for this basename: labopia, cocainscrnur, labbenz, amphetmu, thcu, labbarb    Alcohol Level: No results found for this basename: ETH:2 in the last 72 hours Urinalysis: No results found for this basename: COLORURINE:2,APPERANCEUR:2,LABSPEC:2,PHURINE:2,GLUCOSEU:2,HGBUR:2,BILIRUBINUR:2,KETONESUR:2,PROTEINUR:2,UROBILINOGEN:2,NITRITE:2,LEUKOCYTESUR:2 in the last 72 hours Misc. Labs:  ABGS No results found for this basename: PHART,PCO2,PO2ART,TCO2,HCO3 in the last 72 hours CULTURES Recent Results (from the past 240 hour(s))  CLOSTRIDIUM DIFFICILE BY PCR     Status: Normal   Collection Time   08/08/12  9:07 AM      Component Value Range Status Comment   C difficile by pcr NEGATIVE  NEGATIVE Final    Studies/Results: No results found.  Medications:  Prior to Admission:  Prescriptions prior to admission  Medication Sig Dispense Refill  . amLODipine (NORVASC) 5 MG tablet Take 2.5 mg by mouth daily.      Marland Kitchen atorvastatin (LIPITOR) 80 MG tablet Take 80 mg by mouth daily.      . carbidopa-levodopa (SINEMET) 25-100 MG per tablet Take 1 tablet by mouth 2 (two) times daily.       . chlorthalidone (HYGROTON) 25 MG tablet Take 12.5 mg by mouth daily.      . COMBIGAN 0.2-0.5 % ophthalmic solution Place 1 drop into both eyes every 12 (twelve) hours.       Marland Kitchen donepezil (ARICEPT) 10 MG tablet Take 10 mg by mouth at bedtime.       Marland Kitchen  lisinopril-hydrochlorothiazide (PRINZIDE,ZESTORETIC) 20-12.5 MG per tablet Take 1 tablet by mouth daily.       Scheduled:   . amLODipine  2.5 mg Oral Daily  . atorvastatin  80 mg Oral Daily  .  brimonidine  1 drop Both Eyes Q12H   And  . timolol  1 drop Both Eyes Q12H  . carbidopa-levodopa  1 tablet Oral BID  . chlorthalidone  12.5 mg Oral Daily  . ciprofloxacin  500 mg Oral BID  . donepezil  10 mg Oral QHS  . enoxaparin (LOVENOX) injection  40 mg Subcutaneous Q24H  . lisinopril  20 mg Oral Daily   And  . hydrochlorothiazide  12.5 mg Oral Daily  . metroNIDAZOLE  250 mg Oral TID   Continuous:  PRN:  Assesment:he has diverticulitis. He is having diarhea if his C. Difficile is negative I think he can go home Active Problems:  * No active hospital problems. *     Plan:check C. Difficile and if negative discharge    LOS: 3 days   Luanne Krzyzanowski L 08/08/2012, 3:57 PM

## 2012-08-09 NOTE — Discharge Summary (Signed)
Physician Discharge Summary  Patient ID: KEZIAH DROTAR MRN: [id number] DOB/AGE: [birth year] 78 y.o. Primary Care Physician:Montavius Subramaniam L, MD Admit date: 08/05/2012 Discharge date: 08/09/2012    Discharge Diagnoses:   Principal Problem:  *Diverticulitis Active Problems:  TIA  PERIPHERAL VASCULAR DISEASE  AORTIC VALVE REPLACEMENT, HX OF  Parkinson disease  Hypertension  Hyperlipidemia  Chronic lymphatic leukemia  Pacemaker-[employer]  Fasting hyperglycemia     Medication List     As of 08/09/2012  8:52 AM    TAKE these medications         amLODipine 5 MG tablet   Commonly known as: NORVASC   Take 2.5 mg by mouth daily.      atorvastatin 80 MG tablet   Commonly known as: LIPITOR   Take 80 mg by mouth daily.      carbidopa-levodopa 25-100 MG per tablet   Commonly known as: SINEMET IR   Take 1 tablet by mouth 2 (two) times daily.      chlorthalidone 25 MG tablet   Commonly known as: HYGROTON   Take 12.5 mg by mouth daily.      ciprofloxacin 500 MG tablet   Commonly known as: CIPRO   Take 1 tablet (500 mg total) by mouth 2 (two) times daily.      COMBIGAN 0.2-0.5 % ophthalmic solution   Generic drug: brimonidine-timolol   Place 1 drop into both eyes every 12 (twelve) hours.      donepezil 10 MG tablet   Commonly known as: ARICEPT   Take 10 mg by mouth at bedtime.      lisinopril-hydrochlorothiazide 20-12.5 MG per tablet   Commonly known as: PRINZIDE,ZESTORETIC   Take 1 tablet by mouth daily.      metroNIDAZOLE 250 MG tablet   Commonly known as: FLAGYL   Take 1 tablet (250 mg total) by mouth 3 (three) times daily.        Discharged Condition:improved    Consults:GI  Significant Diagnostic Studies: Ct Abdomen Pelvis W Contrast  08/05/2012  *RADIOLOGY REPORT*  Clinical Data: Generalized abdominal and back pain.  History of high blood pressure, peripheral vascular disease, chronic lymphocytic leukemia, hepatitis, prostate surgery and reflux.   CT ABDOMEN AND PELVIS WITH CONTRAST  Technique:  Multidetector CT imaging of the abdomen and pelvis was performed following the standard protocol during bolus administration of intravenous contrast.  Contrast: OMNIPAQUE IOHEXOL 300 MG/ML  SOLN  Comparison: 12/31/2005 CT abdomen pelvis.  Findings: Lung bases clear.  Pacemaker in place and mild cardiomegaly.  Atherosclerotic type changes of the aorta, iliac arteries and aorta branch vessels with narrowing of the iliac arteries and femoral arteries.  No abdominal aortic aneurysm.  Mild narrowing celiac artery moderate narrowing superior mesenteric artery.  Dome of the liver not entirely included on present exam. Liver cysts are noted.  Within the upper aspect of the right lobe liver, there is a 1.2 cm low density structure which cannot be confirmed as a simple cyst and is new from prior examination.  No calcified gallstones.  Located between the gallbladder, inferior aspect of the liver, duodenum and the hepatic flexure of the colon is a small amount of fluid.  Etiology indeterminate.  This may be related to the diverticulitis of the hepatic flexure as there are scattered diverticular this region.  Primary gallbladder abnormality or duodenal abnormality are secondary considerations.  No free intraperitoneal air.  Low density sub centimeter splenic lesion unchanged.  Mild prominence pancreatic duct stable without mass identified. Calcified ectatic  splenic artery.  No focal adrenal or renal lesion.  Degenerative changes lower thoracic and lumbar spine  Small amount of fluid in the right inguinal canal.  This fluid within the scrotum with calcifications.  Left inguinal canal fatty containing hernia.  Prior TURP.  Decompressed noncontrast filled views of the urinary bladder without gray  IMPRESSION: Located between the gallbladder, inferior aspect of the liver, duodenum and the hepatic flexure of the colon is a small amount of fluid.  Etiology indeterminate.  This  may be related to the diverticulitis of the hepatic flexure as there are scattered diverticular this region.  Primary gallbladder abnormality or duodenal abnormality are secondary considerations.  Please see above for additional findings.   Original Report Authenticated By: Lacy Duverney, M.D.    Dg Chest Portable 1 View  08/05/2012  *RADIOLOGY REPORT*  Clinical Data: Altered mental status, emesis, diarrhea  PORTABLE CHEST - 1 VIEW  Comparison: 02/28/2012; 02/27/2012; 09/04/2009  Findings:  Grossly unchanged cardiac silhouette and mediastinal contours post median sternotomy and CABG.  Stable position of left anterior chest wall dual lead pacemaker.  There is persistent mild elevation of the right hemidiaphragm.  No focal parenchymal opacities.  No definite evidence of pulmonary edema or pleural effusion.  No pneumothorax.  Unchanged bones.  IMPRESSION: No acute cardiopulmonary disease.   Original Report Authenticated By: Tacey Ruiz, MD     Lab Results: Basic Metabolic Panel: No results found for this basename: NA:2,K:2,CL:2,CO2:2,GLUCOSE:2,BUN:2,CREATININE:2,CALCIUM:2,MG:2,PHOS:2 in the last 72 hours Liver Function Tests: No results found for this basename: AST:2,ALT:2,ALKPHOS:2,BILITOT:2,PROT:2,ALBUMIN:2 in the last 72 hours   CBC:  Basename 08/08/12 0441 08/07/12 0516  WBC 3.1* 2.8*  NEUTROABS 0.9* 0.9*  HGB 11.1* 11.1*  HCT 33.0* 32.8*  MCV 88.7 88.4  PLT 126* 110*    Recent Results (from the past 240 hour(s))  CLOSTRIDIUM DIFFICILE BY PCR     Status: Normal   Collection Time   08/08/12  9:07 AM      Component Value Range Status Comment   C difficile by pcr NEGATIVE  NEGATIVE Final      Hospital Course: He was admitted with abdominal pain. CT showed evidence of diverticulitis. He was treated with intravenous antibiotics but improved rapidly and was able to be switched to oral antibiotics. He tolerated first clear liquids then full liquids then low residue diet. He did still have  some diarrhea but was negative for Clostridium difficile and was able to keep up with his fluid requirements. He felt well and wanted to go home and was discharged. It is felt that the rest of his workup could be accomplished as an outpatient  Discharge Exam: Blood pressure 95/56, pulse 60, temperature 98 F (36.7 C), temperature source Oral, resp. rate 20, height 5\' 8"  (1.727 m), weight 85.4 kg (188 lb 4.4 oz), SpO2 95.00%. He is awake and alert. His chest is clear. Heart is regular. Abdomen is soft with no tenderness  Disposition: Home. He did not want home health services      Discharge Orders    Future Orders Please Complete By Expires   Discharge patient         Follow-up Information    Call Nadya Hopwood L, MD. (As needed if symptoms worsen)    Contact information:   [address] [place] [address] [phone]          Signed: Fredirick Maudlin Pager [phone]  08/09/2012, 8:52 AM

## 2012-08-12 LAB — OVA AND PARASITE EXAMINATION

## 2012-08-29 ENCOUNTER — Other Ambulatory Visit: Payer: Self-pay

## 2012-09-02 ENCOUNTER — Other Ambulatory Visit: Payer: Self-pay | Admitting: Cardiology

## 2012-09-03 ENCOUNTER — Other Ambulatory Visit: Payer: Self-pay | Admitting: *Deleted

## 2012-09-04 ENCOUNTER — Ambulatory Visit: Payer: Medicare Other | Attending: Pulmonary Disease | Admitting: Sleep Medicine

## 2012-09-04 VITALS — Ht 66.0 in | Wt 183.0 lb

## 2012-09-04 DIAGNOSIS — G4733 Obstructive sleep apnea (adult) (pediatric): Secondary | ICD-10-CM | POA: Insufficient documentation

## 2012-09-04 DIAGNOSIS — G4700 Insomnia, unspecified: Secondary | ICD-10-CM

## 2012-09-04 DIAGNOSIS — Z6830 Body mass index (BMI) 30.0-30.9, adult: Secondary | ICD-10-CM | POA: Insufficient documentation

## 2012-09-05 NOTE — Progress Notes (Signed)
Pt stated his pacemaker was inserted in 1990's and he could not recall ever having the battery changed in it.

## 2012-09-11 ENCOUNTER — Encounter (INDEPENDENT_AMBULATORY_CARE_PROVIDER_SITE_OTHER): Payer: Self-pay | Admitting: *Deleted

## 2012-09-14 NOTE — Procedures (Signed)
[facility] Malayja Freund A. Gerilyn Pilgrim, MD     [email or web address]        [name], FERRARA             ACCOUNT NO.:  [id number]  MEDICAL RECORD NO.:  [id number]          PATIENT TYPE:  OUT  LOCATION:  SLEEP LAB                     FACILITY:  [facility]  PHYSICIAN:  Cadence Minton A. Gerilyn Pilgrim, M.D. DATE OF BIRTH:  [birth year]  DATE OF STUDY:  09/04/2012                           NOCTURNAL POLYSOMNOGRAM  REFERRING PHYSICIAN:  [name], M.D.  INDICATION:  An 79 year old, who presents with witnessed apnea, snoring, and fatigue.  The study is being done to evaluate for obstructive sleep apnea syndrome.  EPWORTH SLEEPINESS SCORE:  8.  BMI 30.  ARCHITECTURAL SUMMARY:  This is a full night recording.  The total recording time is 438 minutes.  Sleep efficiency is 62%, sleep latency 7 minutes, REM latency 96 minutes.  Stage N1 21%, N2 60%, N3 0%, and REM sleep 19%.  RESPIRATORY SUMMARY:  Baseline oxygen saturation is 97, lowest saturation is 83 during non-REM sleep.  Diagnostic AHI is 8 and RDI 9.  LIMB MOVEMENT SUMMARY:  PLM index is 75.  There is also increased phasic EMG activity/fragmentary myoclonus.  ELECTROCARDIOGRAM SUMMARY:  The patient is noted to have several runs of ventricular tachycardia, mostly between 15 and 20 beats, although There was one run with 40 beats.  There is also some PVCs seen throughout the recording.  IMPRESSION: 1. Mild obstructive sleep apnea syndrome, not requiring positive     pressure treatment. 2. Severe periodic limb movement disorder sleep. 3. Increased phasic EMG activity/fragmentary myoclonus, which can be     associated with a REM sleep behavior disorder, and parkinsonian     syndrome. 4. Several runs of ventricular tachycardia.  Thanks for this referral.   Yug Loria A. Gerilyn Pilgrim, M.D.    KAD/MEDQ  D:  09/14/2012 17:00:56  T:  09/14/2012 18:07:18  Job:  [id number]

## 2012-10-16 ENCOUNTER — Other Ambulatory Visit (HOSPITAL_COMMUNITY): Payer: Self-pay | Admitting: Pulmonary Disease

## 2012-10-16 ENCOUNTER — Ambulatory Visit (HOSPITAL_COMMUNITY)
Admission: RE | Admit: 2012-10-16 | Discharge: 2012-10-16 | Disposition: A | Discharge status: Home / Self Care | Payer: Medicare Other | Source: Ambulatory Visit | Attending: Pulmonary Disease | Admitting: Pulmonary Disease

## 2012-10-16 DIAGNOSIS — S6990XA Unspecified injury of unspecified wrist, hand and finger(s), initial encounter: Secondary | ICD-10-CM | POA: Insufficient documentation

## 2012-10-16 DIAGNOSIS — R609 Edema, unspecified: Secondary | ICD-10-CM

## 2012-10-16 DIAGNOSIS — W19XXXA Unspecified fall, initial encounter: Secondary | ICD-10-CM | POA: Insufficient documentation

## 2012-10-16 DIAGNOSIS — M79609 Pain in unspecified limb: Secondary | ICD-10-CM | POA: Insufficient documentation

## 2012-10-21 ENCOUNTER — Encounter (INDEPENDENT_AMBULATORY_CARE_PROVIDER_SITE_OTHER): Payer: Self-pay | Admitting: Internal Medicine

## 2012-10-21 ENCOUNTER — Ambulatory Visit (INDEPENDENT_AMBULATORY_CARE_PROVIDER_SITE_OTHER): Payer: Medicare Other | Admitting: Internal Medicine

## 2012-10-21 VITALS — BP 130/76 | HR 74 | Temp 97.6°F | Resp 18 | Ht 62.0 in | Wt 192.7 lb

## 2012-10-21 DIAGNOSIS — Z8719 Personal history of other diseases of the digestive system: Secondary | ICD-10-CM

## 2012-10-21 NOTE — Progress Notes (Signed)
Presenting complaint;  Followup to recent hospitalization for abdominal pain.  Subjective:  Patient is a 79year-old African male who is here for scheduled visit accompanied by his daughter [name]. He was hospitalized in December 2013 for abdominal pain and inflammatory changes involving hepatic flexure of his colon. Initially it was felt she may have diverticulitis and then he developed diarrhea and therefore it was felt that he may have colitis. Stool studies were ordered but never completed. His only complaint is one of intermittent nosebleed when he blows his nose. He has good appetite. He denies abdominal pain melena rectal bleeding or diarrhea. One of his 2 daughters stay with him or vice versa. His daughter states he had blood work through [place] [facility] 4 weeks ago.  Current Medications: Current Outpatient Prescriptions  Medication Sig Dispense Refill  . amLODipine (NORVASC) 5 MG tablet TAKE 1/2 TABLET BY MOUTH EVERY DAY  15 tablet  6  . aspirin 81 MG tablet Take 81 mg by mouth daily.      Marland Kitchen atorvastatin (LIPITOR) 80 MG tablet Take 80 mg by mouth daily.      . carbidopa-levodopa (SINEMET) 25-100 MG per tablet Take 1 tablet by mouth 2 (two) times daily.       . chlorthalidone (HYGROTON) 25 MG tablet Take 12.5 mg by mouth daily.      . COMBIGAN 0.2-0.5 % ophthalmic solution Place 1 drop into both eyes every 12 (twelve) hours.       Marland Kitchen donepezil (ARICEPT) 10 MG tablet Take 10 mg by mouth at bedtime.       . fluticasone (FLONASE) 50 MCG/ACT nasal spray Place into the nose daily.      Marland Kitchen lisinopril-hydrochlorothiazide (PRINZIDE,ZESTORETIC) 20-12.5 MG per tablet Take 1 tablet by mouth daily.       No current facility-administered medications for this visit.     Objective: Blood pressure 130/76, pulse 74, temperature 97.6 F (36.4 C), temperature source Oral, resp. rate 18, height 5\' 2"  (1.575 m), weight 192 lb 11.2 oz (87.408 kg). Patient is alert and in no acute distress. Conjunctiva is  pink. Sclera is nonicteric Examination of his nares reveal fresh blood coating right side of septum no active bleeding noted. Oropharyngeal mucosa is normal. No neck masses or thyromegaly noted. Abdomen is full but soft and nontender without organomegaly or masses. No LE edema or clubbing noted.   Assessment:  Recent acute illness would appear to be colitis rather than diverticulitis. He has fully recovered. He remains to be seen if this leukopenia thrombocytopenia and anemia has resolved. His daughter is not interested in further workup unless symptoms relapse. Intermittent epistaxis. He may have either small ulcer or AV malformation involving right side of nasal septum.   Plan:  Review  recent blood work to be provided by patient's daughter [name]. Patient may need evaluation by ENT specialist regarding his epistaxis but will leave it up to Dr. Juanetta Gosling. Patient will return for reevaluation if symptoms relapse.

## 2012-10-21 NOTE — Patient Instructions (Addendum)
Call if abdominal pain or diarrhea relapse. Please bring [place] a copy of recent blood work. Progress report in 3 months. Please let Dr. Juanetta Gosling know  about recurrent nosebleed.

## 2012-11-10 ENCOUNTER — Other Ambulatory Visit (HOSPITAL_COMMUNITY): Payer: Self-pay | Admitting: Pulmonary Disease

## 2012-11-10 ENCOUNTER — Ambulatory Visit (HOSPITAL_COMMUNITY)
Admission: RE | Admit: 2012-11-10 | Discharge: 2012-11-10 | Disposition: A | Discharge status: Home / Self Care | Payer: Medicare Other | Source: Ambulatory Visit | Attending: Pulmonary Disease | Admitting: Pulmonary Disease

## 2012-11-10 ENCOUNTER — Other Ambulatory Visit: Payer: Self-pay | Admitting: Cardiology

## 2012-11-10 DIAGNOSIS — R609 Edema, unspecified: Secondary | ICD-10-CM

## 2012-11-10 DIAGNOSIS — R52 Pain, unspecified: Secondary | ICD-10-CM

## 2012-11-10 DIAGNOSIS — M79609 Pain in unspecified limb: Secondary | ICD-10-CM | POA: Insufficient documentation

## 2012-11-10 DIAGNOSIS — M7989 Other specified soft tissue disorders: Secondary | ICD-10-CM | POA: Insufficient documentation

## 2012-11-10 DIAGNOSIS — I10 Essential (primary) hypertension: Secondary | ICD-10-CM | POA: Insufficient documentation

## 2012-11-12 ENCOUNTER — Other Ambulatory Visit: Payer: Self-pay | Admitting: *Deleted

## 2012-11-13 ENCOUNTER — Ambulatory Visit (INDEPENDENT_AMBULATORY_CARE_PROVIDER_SITE_OTHER): Payer: Medicare Other | Admitting: Otolaryngology

## 2012-11-13 DIAGNOSIS — R040 Epistaxis: Secondary | ICD-10-CM

## 2012-11-13 DIAGNOSIS — J310 Chronic rhinitis: Secondary | ICD-10-CM

## 2012-11-18 ENCOUNTER — Other Ambulatory Visit (HOSPITAL_COMMUNITY): Payer: Self-pay | Admitting: Pulmonary Disease

## 2012-11-18 ENCOUNTER — Encounter (INDEPENDENT_AMBULATORY_CARE_PROVIDER_SITE_OTHER): Payer: Self-pay

## 2012-11-18 DIAGNOSIS — M545 Low back pain: Secondary | ICD-10-CM

## 2012-11-19 ENCOUNTER — Other Ambulatory Visit (HOSPITAL_COMMUNITY): Payer: Self-pay | Admitting: Pulmonary Disease

## 2012-11-19 ENCOUNTER — Ambulatory Visit (HOSPITAL_COMMUNITY)
Admission: RE | Admit: 2012-11-19 | Discharge: 2012-11-19 | Disposition: A | Discharge status: Home / Self Care | Payer: Medicare Other | Source: Ambulatory Visit | Attending: Pulmonary Disease | Admitting: Pulmonary Disease

## 2012-11-19 DIAGNOSIS — Z951 Presence of aortocoronary bypass graft: Secondary | ICD-10-CM | POA: Insufficient documentation

## 2012-11-19 DIAGNOSIS — M25561 Pain in right knee: Secondary | ICD-10-CM

## 2012-11-19 DIAGNOSIS — M25569 Pain in unspecified knee: Secondary | ICD-10-CM | POA: Insufficient documentation

## 2012-11-21 ENCOUNTER — Ambulatory Visit (HOSPITAL_COMMUNITY)
Admission: RE | Admit: 2012-11-21 | Discharge: 2012-11-21 | Disposition: A | Discharge status: Home / Self Care | Payer: Medicare Other | Source: Ambulatory Visit | Attending: Pulmonary Disease | Admitting: Pulmonary Disease

## 2012-11-21 ENCOUNTER — Other Ambulatory Visit (HOSPITAL_COMMUNITY): Payer: Medicare Other

## 2012-11-21 DIAGNOSIS — M545 Low back pain: Secondary | ICD-10-CM

## 2012-12-18 ENCOUNTER — Ambulatory Visit (INDEPENDENT_AMBULATORY_CARE_PROVIDER_SITE_OTHER): Payer: Medicare Other | Admitting: Otolaryngology

## 2012-12-18 DIAGNOSIS — R040 Epistaxis: Secondary | ICD-10-CM

## 2012-12-18 DIAGNOSIS — J310 Chronic rhinitis: Secondary | ICD-10-CM

## 2013-02-14 ENCOUNTER — Other Ambulatory Visit: Payer: Self-pay | Admitting: Cardiology

## 2013-02-16 NOTE — Telephone Encounter (Signed)
Medication sent via escribe.  

## 2013-03-10 ENCOUNTER — Ambulatory Visit (INDEPENDENT_AMBULATORY_CARE_PROVIDER_SITE_OTHER): Payer: Medicare Other | Admitting: Cardiology

## 2013-03-10 ENCOUNTER — Other Ambulatory Visit: Payer: Self-pay | Admitting: Cardiology

## 2013-03-10 ENCOUNTER — Encounter: Payer: Self-pay | Admitting: Cardiology

## 2013-03-10 VITALS — BP 128/46 | HR 61 | Ht 66.0 in | Wt 192.5 lb

## 2013-03-10 DIAGNOSIS — I2510 Atherosclerotic heart disease of native coronary artery without angina pectoris: Secondary | ICD-10-CM

## 2013-03-10 DIAGNOSIS — I10 Essential (primary) hypertension: Secondary | ICD-10-CM

## 2013-03-10 DIAGNOSIS — E785 Hyperlipidemia, unspecified: Secondary | ICD-10-CM

## 2013-03-10 DIAGNOSIS — Z950 Presence of cardiac pacemaker: Secondary | ICD-10-CM

## 2013-03-10 DIAGNOSIS — C9110 Chronic lymphocytic leukemia of B-cell type not having achieved remission: Secondary | ICD-10-CM

## 2013-03-10 DIAGNOSIS — Z954 Presence of other heart-valve replacement: Secondary | ICD-10-CM

## 2013-03-10 MED ORDER — AMLODIPINE BESYLATE 5 MG PO TABS: ORAL_TABLET | ORAL | Status: DC

## 2013-03-10 NOTE — Patient Instructions (Addendum)
Your physician recommends that you schedule a follow-up appointment in: 8 MONTHS  Your physician recommends THAT YOU HAVE A FOLLOW UP APPOINTMENT WITH YOUR ONCOLOGIST A staff member from our office will alert you the with appointment date and time, once available

## 2013-03-10 NOTE — Progress Notes (Deleted)
Name: [name]    DOB: 07/18/1921  Age: 79 y.o.  MR#: [id number]       PCP:  Fredirick Maudlin, MD      Insurance: Payor: MEDICARE / Plan: MEDICARE PART A AND B / Product Type: *No Product type* /   CC:   No chief complaint on file.  BOTTLES VS Filed Vitals:   03/10/13 1516  BP: 128/46  Pulse: 61  Height: 5\' 6"  (1.676 m)  Weight: 192 lb 8 oz (87.317 kg)    Weights Current Weight  03/10/13 192 lb 8 oz (87.317 kg)  10/21/12 192 lb 11.2 oz (87.408 kg)  09/04/12 183 lb (83.008 kg)    Blood Pressure  BP Readings from Last 3 Encounters:  03/10/13 128/46  10/21/12 130/76  08/08/12 95/56     Admit date:  (Not on file) Last encounter with RMR:  03/10/2013   Allergy Shellfish allergy and Ambien  Current Outpatient Prescriptions  Medication Sig Dispense Refill  . amLODipine (NORVASC) 5 MG tablet TAKE 1/2 TABLET BY MOUTH EVERY DAY  90 tablet  2  . aspirin 81 MG tablet Take 81 mg by mouth daily.      . carbidopa-levodopa (SINEMET) 25-100 MG per tablet Take 1 tablet by mouth 2 (two) times daily.       . chlorthalidone (HYGROTON) 25 MG tablet TAKE 1/2 TABLET BY MOUTH EVERY DAY  45 tablet  0  . COMBIGAN 0.2-0.5 % ophthalmic solution Place 1 drop into both eyes every 12 (twelve) hours.       Marland Kitchen donepezil (ARICEPT) 10 MG tablet Take 10 mg by mouth at bedtime.       . fluticasone (FLONASE) 50 MCG/ACT nasal spray Place into the nose daily.      . furosemide (LASIX) 40 MG tablet       . ipratropium (ATROVENT) 0.06 % nasal spray       . lisinopril-hydrochlorothiazide (PRINZIDE,ZESTORETIC) 20-12.5 MG per tablet Take 1 tablet by mouth daily.      . Melatonin 5 MG CAPS Take 1 capsule by mouth at bedtime.      . rosuvastatin (CRESTOR) 5 MG tablet Take 5 mg by mouth daily.       No current facility-administered medications for this visit.    Discontinued Meds:    Medications Discontinued During This Encounter  Medication Reason  . atorvastatin (LIPITOR) 80 MG tablet Error  .  amLODipine (NORVASC) 5 MG tablet Reorder    Patient Active Problem List   Diagnosis Date Noted  . Diverticulitis 08/08/2012  . Fasting hyperglycemia 06/13/2012  . Pacemaker-St.Jude 06/11/2012  . Carotid artery hypersensitivity 02/27/2012  . Bradycardia, severe sinus 02/27/2012  . Tobacco abuse, in remission   . Parkinson disease   . Hypertension   . Hyperlipidemia   . Chronic lymphatic leukemia   . Insomnia   . ATHEROSCLEROTIC CARDIOVASCULAR DISEASE 06/21/2009  . TIA 06/21/2009  . PERIPHERAL VASCULAR DISEASE 06/21/2009  . AORTIC VALVE REPLACEMENT, HX OF 06/21/2009  . Glaucoma 09/27/1994    LABS    Component Value Date/Time   NA 139 08/05/2012 1007   NA 141 06/10/2012 0723   NA 138 02/28/2012 0435   K 3.8 08/05/2012 1007   K 4.7 06/10/2012 0723   K 3.6 02/28/2012 0435   CL 103 08/05/2012 1007   CL 102 06/10/2012 0723   CL 99 02/28/2012 0435   CO2 26 08/05/2012 1007   CO2 30 06/10/2012 0723   CO2 26 02/28/2012 0435  GLUCOSE 208* 08/05/2012 1007   GLUCOSE 150* 06/10/2012 0723   GLUCOSE 153* 02/28/2012 0435   BUN 19 08/05/2012 1007   BUN 20 06/10/2012 0723   BUN 18 02/28/2012 0435   CREATININE 1.33 08/05/2012 1007   CREATININE 1.28 06/10/2012 0723   CREATININE 1.14 02/28/2012 0435   CREATININE 1.11 02/27/2012 1611   CREATININE 1.27 09/04/2011 1355   CREATININE 1.15 03/30/2011 0735   CALCIUM 9.0 08/05/2012 1007   CALCIUM 9.3 06/10/2012 0723   CALCIUM 8.7 02/28/2012 0435   GFRNONAA 46* 08/05/2012 1007   GFRNONAA 55* 02/28/2012 0435   GFRNONAA 57* 02/27/2012 1611   GFRAA 53* 08/05/2012 1007   GFRAA 64* 02/28/2012 0435   GFRAA 66* 02/27/2012 1611   CMP     Component Value Date/Time   NA 139 08/05/2012 1007   K 3.8 08/05/2012 1007   CL 103 08/05/2012 1007   CO2 26 08/05/2012 1007   GLUCOSE 208* 08/05/2012 1007   BUN 19 08/05/2012 1007   CREATININE 1.33 08/05/2012 1007   CREATININE 1.28 06/10/2012 0723   CALCIUM 9.0 08/05/2012 1007   PROT 6.4 08/05/2012 1007   ALBUMIN 3.3*  08/05/2012 1007   AST 15 08/05/2012 1007   ALT 6 08/05/2012 1007   ALKPHOS 105 08/05/2012 1007   BILITOT 0.5 08/05/2012 1007   GFRNONAA 46* 08/05/2012 1007   GFRAA 53* 08/05/2012 1007       Component Value Date/Time   WBC 3.1* 08/08/2012 0441   WBC 2.8* 08/07/2012 0516   WBC 3.0* 08/05/2012 0957   HGB 11.1* 08/08/2012 0441   HGB 11.1* 08/07/2012 0516   HGB 11.7* 08/05/2012 0957   HCT 33.0* 08/08/2012 0441   HCT 32.8* 08/07/2012 0516   HCT 34.7* 08/05/2012 0957   MCV 88.7 08/08/2012 0441   MCV 88.4 08/07/2012 0516   MCV 89.2 08/05/2012 0957    Lipid Panel     Component Value Date/Time   CHOL 174 06/10/2012 0723   TRIG 174* 06/10/2012 0723   HDL 54 06/10/2012 0723   CHOLHDL 3.2 06/10/2012 0723   VLDL 35 06/10/2012 0723   LDLCALC 85 06/10/2012 0723    ABG No results found for this basename: phart, pco2, pco2art, po2, po2art, hco3, tco2, acidbasedef, o2sat     Lab Results  Component Value Date   TSH 1.536 09/04/2011   BNP (last 3 results) No results found for this basename: PROBNP,  in the last 8760 hours Cardiac Panel (last 3 results) No results found for this basename: CKTOTAL, CKMB, TROPONINI, RELINDX,  in the last 72 hours  Iron/TIBC/Ferritin    Component Value Date/Time   IRON 59 10/06/2008 1414   TIBC 302 10/06/2008 1414   FERRITIN 77 10/06/2008 1414     EKG Orders placed in visit on 03/10/13  . EKG 12-LEAD     Prior Assessment and Plan Problem List as of 03/10/2013   ATHEROSCLEROTIC CARDIOVASCULAR DISEASE   Last Assessment & Plan   06/16/2012 Office Visit Written 06/16/2012  9:05 AM by Marinus Maw, MD     He denies anginal symptoms. I have encouraged the patient to increase his physical activity. He will continue his current medical therapy.    TIA   PERIPHERAL VASCULAR DISEASE   AORTIC VALVE REPLACEMENT, HX OF   Last Assessment & Plan   08/30/2011 Office Visit Written 08/30/2011  8:09 PM by Kathlen Brunswick, MD     Exam is benign with no evidence  for valve dysfunction.  Echocardiography is not  required in the absence of symptoms or physical findings suggesting a problem.    Tobacco abuse, in remission   Last Assessment & Plan   08/30/2011 Office Visit Edited 09/02/2011 10:10 AM by Kathlen Brunswick, MD     Patient has physical findings of acute bronchospasm, which have not been present during past evaluations.  Most likely, this represents a viral upper respiratory infection rather than chronic obstructive pulmonary disease related to remote tobacco use.  In the absence of symptoms, no further evaluation or treatment is needed.    Parkinson disease   Hypertension   Last Assessment & Plan   06/16/2012 Office Visit Written 06/16/2012  9:04 AM by Marinus Maw, MD     His blood pressure today is well controlled. He'll continue his current medical therapy, and maintain a low-sodium diet.    Hyperlipidemia   Last Assessment & Plan   08/30/2011 Office Visit Written 08/30/2011  8:16 PM by Kathlen Brunswick, MD     Acceptable control of hyperlipidemia with maximal dose of atorvastatin, which will be continued.    Chronic lymphatic leukemia   Last Assessment & Plan   08/30/2011 Office Visit Written 08/30/2011  8:11 PM by Kathlen Brunswick, MD     Minor hematologic abnormalities without significant symptoms.    Glaucoma   Insomnia   Last Assessment & Plan   05/05/2012 Office Visit Written 05/05/2012  4:21 PM by Kathlen Brunswick, MD     Middle of the night awakening; patient developed confusion with low-dose Ambien and is now utilizing no pharmacologic therapy.     Carotid artery hypersensitivity   Bradycardia, severe sinus   Pacemaker-St.Jude   Last Assessment & Plan   06/16/2012 Office Visit Written 06/16/2012  9:04 AM by Marinus Maw, MD     His St. Jude's dual-chamber pacemaker is working normally. We'll plan to recheck in several months.    Fasting hyperglycemia   Diverticulitis       Imaging: No results found.

## 2013-03-12 ENCOUNTER — Ambulatory Visit (INDEPENDENT_AMBULATORY_CARE_PROVIDER_SITE_OTHER): Payer: Medicare Other | Admitting: Internal Medicine

## 2013-03-12 ENCOUNTER — Encounter: Payer: Self-pay | Admitting: Internal Medicine

## 2013-03-12 VITALS — BP 144/51 | HR 74 | Ht 68.0 in | Wt 192.0 lb

## 2013-03-12 DIAGNOSIS — I10 Essential (primary) hypertension: Secondary | ICD-10-CM

## 2013-03-12 DIAGNOSIS — R001 Bradycardia, unspecified: Secondary | ICD-10-CM

## 2013-03-12 DIAGNOSIS — I495 Sick sinus syndrome: Secondary | ICD-10-CM

## 2013-03-12 DIAGNOSIS — Z950 Presence of cardiac pacemaker: Secondary | ICD-10-CM

## 2013-03-12 LAB — PACEMAKER DEVICE OBSERVATION
AL AMPLITUDE: 2.6 mv
AL IMPEDENCE PM: 462.5 Ohm
BATTERY VOLTAGE: 2.9328 V
RV LEAD IMPEDENCE PM: 525 Ohm
RV LEAD THRESHOLD: 1 V

## 2013-03-12 NOTE — Patient Instructions (Addendum)
Your physician recommends that you schedule a follow-up appointment in: 1 year with Dr Ladona Ridgel and Device check 06-15-13  Your physician recommends that you continue on your current medications as directed. Please refer to the Current Medication list given to you today.

## 2013-03-14 ENCOUNTER — Encounter: Payer: Self-pay | Admitting: Internal Medicine

## 2013-03-14 NOTE — Progress Notes (Signed)
HPI Mr. [name] returns for followup. He is a pleasant elderly man with a h/o symptomatic bradycardia, s/p PPM, CAD, s/p CABG, and HTN. In the interim, he has done well. He denies syncope, sob or chest pain.  Allergies  Allergen Reactions  . Shellfish Allergy Anaphylaxis  . Ambien (Zolpidem Tartrate)      Current Outpatient Prescriptions  Medication Sig Dispense Refill  . amLODipine (NORVASC) 5 MG tablet TAKE 1/2 TABLET BY MOUTH EVERY DAY  90 tablet  2  . aspirin 81 MG tablet Take 81 mg by mouth daily.      . carbidopa-levodopa (SINEMET) 25-100 MG per tablet Take 1 tablet by mouth 2 (two) times daily.       . chlorthalidone (HYGROTON) 25 MG tablet TAKE 1/2 TABLET BY MOUTH EVERY DAY  45 tablet  0  . COMBIGAN 0.2-0.5 % ophthalmic solution Place 1 drop into both eyes every 12 (twelve) hours.       Marland Kitchen donepezil (ARICEPT) 10 MG tablet Take 10 mg by mouth at bedtime.       . fluticasone (FLONASE) 50 MCG/ACT nasal spray Place into the nose daily.      . furosemide (LASIX) 40 MG tablet       . ipratropium (ATROVENT) 0.06 % nasal spray       . lisinopril-hydrochlorothiazide (PRINZIDE,ZESTORETIC) 20-12.5 MG per tablet Take 1 tablet by mouth daily.      . Melatonin 5 MG CAPS Take 1 capsule by mouth at bedtime.      . rosuvastatin (CRESTOR) 5 MG tablet Take 5 mg by mouth daily.       No current facility-administered medications for this visit.     Past Medical History  Diagnosis Date  . Arteriosclerotic cardiovascular disease (ASCVD)     CABG & bioprosthetic aortic valve replace in 1999. PCI of right coronary in 2000, catheter in 07/2002 revealed total occlusion of the LAD; LIMA-LAD patent; patent SVG-OM1, SVG-diagonal ramus, PDA occluded; patent Cx stent, distal 30-40% stenosis Cx; nondom RCA, prox RCA 20-30% lesion, distal RCA 90% lesion; normal EF  . TIA (transient ischemic attack)   . Hypertension     normal CMet and 2011  . Hyperlipidemia     Lipid profile in 12/2009-170, 73, 79, 76  .  PVD (peripheral vascular disease)   . Parkinson disease 2010    mild; + dementia  . History of sinus bradycardia     induced by beta blocker  . History of syncope   . Chronic lymphatic leukemia     mild pancytopenia in 02/2010;hemoglobin-12.9, WBC-3.3, platelets-109  . Hepatitis   . Glaucoma 09/1994  . Tobacco abuse, in remission     20 pack years; discontinued 50 years ago  . Insomnia     Middle of the night awakening  . Coronary artery disease   . Dysrhythmia     Bradycardia  . Shortness of breath   . GERD (gastroesophageal reflux disease)     ROS:   All systems reviewed and negative except as noted in the HPI.   Past Surgical History  Procedure Laterality Date  . Retinal detachment surgery  1989  . Tonsillectomy    . Transurethral resection of prostate      Benign prostatic hypertrophy  . Coronary artery bypass graft  1999    + Bioprosthetic AVR  . Colonoscopy  2011  . A-v cardiac pacemaker insertion  02/2012    St. Jude Accent DR RF     Family History  Problem Relation Age of Onset  . Pneumonia Father   . Cancer Brother   . Breast cancer Sister   . Diabetes Sister     complications     History   Social History  . Marital Status: Widowed    Spouse Name: N/A    Number of Children: N/A  . Years of Education: N/A   Occupational History  . Not on file.   Social History Main Topics  . Smoking status: Former Games developer  . Smokeless tobacco: Never Used  . Alcohol Use: Yes     Comment: rare  . Drug Use: No  . Sexually Active: Not Currently   Other Topics Concern  . Not on file   Social History Narrative   Retired. Regularly exercises. Has 3 daughters alive and well.      BP 144/51  Pulse 74  Ht 5\' 8"  (1.727 m)  Wt 192 lb (87.091 kg)  BMI 29.2 kg/m2  Physical Exam:  Well appearing elderly man, NAD HEENT: Unremarkable Neck:  7 cm JVD, no thyromegally Back:  No CVA tenderness Lungs:  Clear with no wheezes. HEART:  Regular rate rhythm, no  murmurs, no rubs, no clicks Abd:  soft, positive bowel sounds, no organomegally, no rebound, no guarding Ext:  2 plus pulses, no edema, no cyanosis, no clubbing Skin:  No rashes no nodules Neuro:  CN II through XII intact, motor grossly intact   DEVICE  Normal device function.  See PaceArt for details.   Assess/Plan:

## 2013-03-14 NOTE — Assessment & Plan Note (Signed)
His St. Jude DDD PPM is working normally. Will recheck in several months. 

## 2013-03-14 NOTE — Assessment & Plan Note (Signed)
His blood pressure is slightly elevated. He will continue his current meds.  

## 2013-03-19 ENCOUNTER — Encounter (HOSPITAL_COMMUNITY): Payer: Medicare Other | Attending: Hematology and Oncology

## 2013-03-19 ENCOUNTER — Encounter (HOSPITAL_COMMUNITY): Payer: Self-pay

## 2013-03-19 VITALS — BP 111/39 | HR 64 | Temp 98.3°F | Resp 16 | Ht 63.75 in | Wt 195.4 lb

## 2013-03-19 DIAGNOSIS — D72820 Lymphocytosis (symptomatic): Secondary | ICD-10-CM | POA: Insufficient documentation

## 2013-03-19 DIAGNOSIS — D61818 Other pancytopenia: Secondary | ICD-10-CM

## 2013-03-19 LAB — CBC WITH DIFFERENTIAL/PLATELET
Eosinophils Absolute: 0.1 10*3/uL (ref 0.0–0.7)
Eosinophils Relative: 2 % (ref 0–5)
HCT: 34.1 % — ABNORMAL LOW (ref 39.0–52.0)
Hemoglobin: 11.5 g/dL — ABNORMAL LOW (ref 13.0–17.0)
Lymphocytes Relative: 51 % — ABNORMAL HIGH (ref 12–46)
Lymphs Abs: 1.7 10*3/uL (ref 0.7–4.0)
MCH: 29.4 pg (ref 26.0–34.0)
MCV: 87.2 fL (ref 78.0–100.0)
Monocytes Absolute: 0.5 10*3/uL (ref 0.1–1.0)
Monocytes Relative: 14 % — ABNORMAL HIGH (ref 3–12)
Platelets: 134 10*3/uL — ABNORMAL LOW (ref 150–400)
RBC: 3.91 MIL/uL — ABNORMAL LOW (ref 4.22–5.81)
WBC: 3.3 10*3/uL — ABNORMAL LOW (ref 4.0–10.5)

## 2013-03-19 LAB — LACTATE DEHYDROGENASE: LDH: 193 U/L (ref 94–250)

## 2013-03-19 LAB — COMPREHENSIVE METABOLIC PANEL
ALT: 8 U/L (ref 0–53)
Alkaline Phosphatase: 89 U/L (ref 39–117)
BUN: 38 mg/dL — ABNORMAL HIGH (ref 6–23)
CO2: 29 mEq/L (ref 19–32)
Calcium: 9.1 mg/dL (ref 8.4–10.5)
GFR calc Af Amer: 41 mL/min — ABNORMAL LOW (ref >90)
GFR calc non Af Amer: 35 mL/min — ABNORMAL LOW (ref >90)
Glucose, Bld: 98 mg/dL (ref 70–99)
Sodium: 136 mEq/L (ref 135–145)
Total Protein: 7.1 g/dL (ref 6.0–8.3)

## 2013-03-19 NOTE — Progress Notes (Addendum)
Patient History and Physical   [name] [id number] Dec 06, 1920 79 y.o. 03/19/2013  Referring MD: [name].  Chief Complaint: 'History of Leukemia'   HPI:  [name] is an 79 year old gentleman with multiple comorbidities as listed below in the past medical history below.  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  Patient for unclear reasons was lost to follow up since February of 2011 .  He was sent to reestablish care and follow up.  I have reviewed his paper records , patient had a bone marrow aspiration and biopsy 09/02/1992 which showed hypocellular marrow with relatively increased in lymphocyte population. Peripheral blood flow cytometric dated 07/04/1993 showed abnormal T cell phenotype, CD8 positive with partial expression of CD16 and CD56 suggestive of larger on the lymphocytosis.  Subsequent T-cell receptor Gene rearrangement did  not show clonal T-cell population however it was stated that the DNA recovered from the peripheral blood cells limited study and presence of clonal T-cell could not be definitely excluded.  Looking at his historical CBC patient has had mild pancytopenia. From 03/07/2007 hemoglobin has ranged from 11.1 2 at 13.3 g/dL, and absolute neutrophilic count has ranged from 0.9-3.7K. Additionally platelet has ranged from 154K to 104K.  Patient has hearing impairment and was accompanied by her daughter [name] who provided most of the history.  Patient lives with her.  She tells me that patient has not had any recent or chronic infections, and no recent blood transfusion except 16 years ago during coronary artery bypass.  Patient also has not had any easy bruising or bleeding.  There is no report of hematuria, hematochezia or hematemesis.  Patient also denies melena.   PMH: Past Medical History  Diagnosis Date  . Arteriosclerotic cardiovascular disease (ASCVD)     CABG & bioprosthetic  aortic valve replace in 1999. PCI of right coronary in 2000, catheter in 07/2002 revealed total occlusion of the LAD; LIMA-LAD patent; patent SVG-OM1, SVG-diagonal ramus, PDA occluded; patent Cx stent, distal 30-40% stenosis Cx; nondom RCA, prox RCA 20-30% lesion, distal RCA 90% lesion; normal EF  . TIA (transient ischemic attack)   . Hypertension     normal CMet and 2011  . Hyperlipidemia     Lipid profile in 12/2009-170, 73, 79, 76  . PVD (peripheral vascular disease)   . Parkinson disease 2010    mild; + dementia  . History of sinus bradycardia     induced by beta blocker  . History of syncope   . Chronic lymphatic leukemia     mild pancytopenia in 02/2010;hemoglobin-12.9, WBC-3.3, platelets-109  . Hepatitis   . Glaucoma 09/1994  . Tobacco abuse, in remission     20 pack years; discontinued 50 years ago  . Insomnia     Middle of the night awakening  . Coronary artery disease   . Dysrhythmia     Bradycardia  . Shortness of breath   . GERD (gastroesophageal reflux disease)     Past Surgical History  Procedure Laterality Date  . Retinal detachment surgery  1989  . Tonsillectomy    . Transurethral resection of prostate      Benign prostatic hypertrophy  . Coronary artery bypass graft  1999    + Bioprosthetic AVR  . Colonoscopy  2011  . A-v cardiac pacemaker insertion  02/2012    St. Jude Accent DR RF    Allergies: Allergies  Allergen Reactions  . Shellfish Allergy Anaphylaxis  .  Ambien (Zolpidem Tartrate)     Medications: Current outpatient prescriptions:amLODipine (NORVASC) 5 MG tablet, TAKE 1/2 TABLET BY MOUTH EVERY DAY, Disp: 90 tablet, Rfl: 2;  aspirin 81 MG tablet, Take 81 mg by mouth daily., Disp: , Rfl: ;  carbidopa-levodopa (SINEMET) 25-100 MG per tablet, Take 1 tablet by mouth 2 (two) times daily. , Disp: , Rfl: ;  chlorthalidone (HYGROTON) 25 MG tablet, TAKE 1/2 TABLET BY MOUTH EVERY DAY, Disp: 45 tablet, Rfl: 0 COMBIGAN 0.2-0.5 % ophthalmic solution, Place 1  drop into both eyes every 12 (twelve) hours. , Disp: , Rfl: ;  donepezil (ARICEPT) 10 MG tablet, Take 10 mg by mouth at bedtime. , Disp: , Rfl: ;  furosemide (LASIX) 40 MG tablet, 40 mg. Sunday, Tuesdays, Thursdays, & Saturdays, Disp: , Rfl: ;  lisinopril-hydrochlorothiazide (PRINZIDE,ZESTORETIC) 20-12.5 MG per tablet, Take 1 tablet by mouth daily., Disp: , Rfl:  Melatonin 5 MG CAPS, Take 1 capsule by mouth at bedtime., Disp: , Rfl: ;  rosuvastatin (CRESTOR) 5 MG tablet, Take 5 mg by mouth daily., Disp: , Rfl: ;  fluticasone (FLONASE) 50 MCG/ACT nasal spray, Place into the nose daily., Disp: , Rfl: ;  ipratropium (ATROVENT) 0.06 % nasal spray, , Disp: , Rfl:    Social History:   reports that he has quit smoking. He has never used smokeless tobacco. He reports that  drinks alcohol. He reports that he does not use illicit drugs.  His wife died in 2011-06-21.  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  Family History: Family History  Problem Relation Age of Onset  . Pneumonia Father   . Cancer Brother   . Breast cancer Sister   . Diabetes Sister     complications  Patient has a brother who died from cancer and another sister who died from breast cancer.  Review of Systems: He is eating well, denies night sweats fever or chills.  No shortness of breath.14 point review of system is as in the history above otherwise negative.   Physical Exam: Blood pressure 111/39, pulse 64, temperature 98.3 F (36.8 C), resp. rate 16, height 5' 3.75" (1.619 m), weight 195 lb 6.4 oz (88.633 kg). GENERAL: No distress,elderly. SKIN:  No rashes or significant lesions , no ecchymosis or petechiae rash. HEAD: Normocephalic, No masses, lesions, tenderness or abnormalities  EYES: Conjunctiva are pink and non-injected and no jaundice ENT: External ears normal ,lips, buccal mucosa, and tongue normal and mucous  membranes are moist  LYMPH: No palpable lymphadenopathy, in the neck, supraclavicular region and, axilla and inguinal areas. LUNGS: clear to auscultation , no crackles or wheezes HEART: regular rate & rhythm, no murmurs, no gallops, S1 normal and S2 normal  ABDOMEN: Abdomen soft, non-tender, normal bowel sounds, no masses or organomegaly and no hepatosplenomegaly palpable. MSK: No CVA tenderness and no tenderness on percussion of the back or rib cage. EXTREMITIES: No edema, no skin discoloration or tenderness NEURO:Alert no obvious focal  motor deficit.    Lab Results: Lab Results  Component Value Date   WBC 3.1* 08/08/2012   HGB 11.1* 08/08/2012   HCT 33.0* 08/08/2012   MCV 88.7 08/08/2012   PLT 126* 08/08/2012     Chemistry      Component Value Date/Time   NA 139 08/05/2012 1007   K 3.8 08/05/2012 1007   CL 103 08/05/2012 1007   CO2 26  08/05/2012 1007   BUN 19 08/05/2012 1007   CREATININE 1.33 08/05/2012 1007   CREATININE 1.28 06/10/2012 0723      Component Value Date/Time   CALCIUM 9.0 08/05/2012 1007   ALKPHOS 105 08/05/2012 1007   AST 15 08/05/2012 1007   ALT 6 08/05/2012 1007   BILITOT 0.5 08/05/2012 1007         Radiological Studies: No results found.    Impression: Mr. Coye has history of a large ago and her lymphocytosis with mild pancytopenia in the past.  Is the last of follow up for over 3 years now. 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 T -cell  receptor gene rearrangement study even though negative was suboptimal . The flow cytometry at that time had shown evidence of large granular lymphocytosis and a bone marrow biopsy was non diagnostic despite showing lymphocytosis.  Recommendations: 1. CBC/ CMP/LDH/ flow cytometry with T cell receptor are generally arrangement ordered today in bid to establish  The definitive diagnosis. 2. I  explained to the patient and her daughter that in absence of critical or symptomatic cytopenias treatment is not warranted. 3.  Patient will return to clinic in 4 weeks to review these results. 4. Review of peripheral blood smear pending.  All questions were satisfactorily answered.  I spent more than 50% of the time on counseling the patient face to face coordination of care. The total time spent in the appointment was 60 minutes.   Sherral Hammers, MD FACP. Hematology/Oncology.    Addendum: Peripheral blood smear review showed large granular lymphocytes, segmented neutrophils with toxic granulations.  The red blood cells were otherwise unremarkable morphology.  Adequate platelets.

## 2013-03-19 NOTE — Progress Notes (Signed)
Gildardo Cranker presented for [employer]. Labs per MD order drawn via Peripheral Line 23 gauge needle inserted in right AC  Good blood return present. Procedure without incident.  Needle removed intact. Patient tolerated procedure well.  Spoke with [employer] [facility].  Specimen requirements are 2 lavendar and 2 green top tubes (lithium heparin).  Requisition completed for tests needed and submitted with demographic data to lab.

## 2013-03-19 NOTE — Patient Instructions (Addendum)
[facility] Discharge Instructions  RECOMMENDATIONS MADE BY THE CONSULTANT AND ANY TEST RESULTS WILL BE SENT TO YOUR REFERRING PHYSICIAN.  EXAM FINDINGS BY THE PHYSICIAN TODAY AND SIGNS OR SYMPTOMS TO REPORT TO CLINIC OR PRIMARY PHYSICIAN:  Exam and discussion by Dr. Sharia Reeve.  We need to do some additional blood work.  As long as your blood counts are ok, we don't need to do anything but watch you.  MEDICATIONS PRESCRIBED:  none  INSTRUCTIONS GIVEN AND DISCUSSED: Report fevers, chills, recurring infections, etc.  SPECIAL INSTRUCTIONS/FOLLOW-UP: Follow-up in 1 month.  Thank you for choosing [facility] to provide your oncology and hematology care.  To afford each patient quality time with our providers, please arrive at least 15 minutes before your scheduled appointment time.  With your help, our goal is to use those 15 minutes to complete the necessary work-up to ensure our physicians have the information they need to help with your evaluation and healthcare recommendations.    Effective January 1st, 2014, we ask that you re-schedule your appointment with our physicians should you arrive 10 or more minutes late for your appointment.  We strive to give you quality time with our providers, and arriving late affects you and other patients whose appointments are after yours.    Again, thank you for choosing [facility]  Our hope is that these requests will decrease the amount of time that you wait before being seen by our physicians.       _____________________________________________________________  Should you have questions after your visit to [facility], please contact our office at [phone] between the hours of 8:30 a.m. and 5:00 p.m.  Voicemails left after 4:30 p.m. will not be returned until the following business day.  For prescription refill requests, have your pharmacy contact our office with your prescription refill  request.

## 2013-03-23 NOTE — Assessment & Plan Note (Signed)
Bioprosthetic valve was implanted 15 years ago. There is no evidence by history or exam for valve dysfunction. We will continue to follow.

## 2013-03-23 NOTE — Assessment & Plan Note (Addendum)
Pancytopenia remains mild. Patient has not been seen by his oncologist 4 years. A return visit will be scheduled.

## 2013-03-23 NOTE — Assessment & Plan Note (Signed)
Lipid profile was excellent when last assessed nearly one year ago. A repeat assessment will be obtained.

## 2013-03-23 NOTE — Progress Notes (Signed)
Patient ID: [name], male   DOB: Apr 24, 1921, 79 y.o.   MRN: [id number]  HPI: Scheduled return visit for this nice gentleman with conduction system disease and a permanent pacemaker, coronary artery disease, prior aortic valve replacement surgery and multiple vascular risk factors. Despite a multitude of medical problems and advanced age, he continues to do generally well. He was admitted to hospital 8 months ago with diverticulitis and responded well to medical therapy.  Family reports variable compliance with salt restriction and leg elevation.  He has experienced myalgias prompting substitution of low-dose rosuvastatin for a atorvastatin with resolution.  Current Outpatient Prescriptions  Medication Sig Dispense Refill  . amLODipine (NORVASC) 5 MG tablet TAKE 1/2 TABLET BY MOUTH EVERY DAY  90 tablet  2  . aspirin 81 MG tablet Take 81 mg by mouth daily.      . carbidopa-levodopa (SINEMET) 25-100 MG per tablet Take 1 tablet by mouth 2 (two) times daily.       . chlorthalidone (HYGROTON) 25 MG tablet TAKE 1/2 TABLET BY MOUTH EVERY DAY  45 tablet  0  . COMBIGAN 0.2-0.5 % ophthalmic solution Place 1 drop into both eyes every 12 (twelve) hours.       Marland Kitchen donepezil (ARICEPT) 10 MG tablet Take 10 mg by mouth at bedtime.       . fluticasone (FLONASE) 50 MCG/ACT nasal spray Place into the nose daily.      . furosemide (LASIX) 40 MG tablet 40 mg. Sunday, Tuesdays, Thursdays, & Saturdays      . ipratropium (ATROVENT) 0.06 % nasal spray       . lisinopril-hydrochlorothiazide (PRINZIDE,ZESTORETIC) 20-12.5 MG per tablet Take 1 tablet by mouth daily.      . Melatonin 5 MG CAPS Take 1 capsule by mouth at bedtime.      . rosuvastatin (CRESTOR) 5 MG tablet Take 5 mg by mouth daily.       No current facility-administered medications for this visit.   Allergies  Allergen Reactions  . Shellfish Allergy Anaphylaxis  . Ambien (Zolpidem Tartrate)      Past medical history, social history, and family  history reviewed and updated.  ROS: Denies orthopnea, PND, lightheadedness or syncope. He has mild stable lower extremity edema. All other systems reviewed and are negative  PHYSICAL EXAM: BP 128/46  Pulse 61  Ht 5\' 6"  (1.676 m)  Wt 87.317 kg (192 lb 8 oz)  BMI 31.08 kg/m2;  Body mass index is 31.08 kg/(m^2). General-Well developed; no acute distress; flat affect, but alert and responsive Body habitus-mildly overweight Neck-No JVD; no carotid bruits Lungs-clear lung fields; resonant to percussion Cardiovascular-normal PMI; normal S1 and S2 Abdomen-normal bowel sounds; soft and non-tender without masses or organomegaly Musculoskeletal-No deformities, no cyanosis or clubbing Neurologic-Normal cranial nerves; symmetric strength and tone Skin-Warm, no significant lesions Extremities-distal pulses-1+; 1-2+ edema  EKG: Atrially paced rhythm; first-degree AV block; rightward axis; cannot exclude prior septal myocardial infarction; minor nonspecific ST-T wave abnormality. No previous tracing for comparison.  [place] Bing, MD 03/23/2013  7:35 PM  ASSESSMENT AND PLAN

## 2013-03-23 NOTE — Assessment & Plan Note (Signed)
Patient describes no symptoms at present to suggest myocardial ischemia. He's had a very durable result from CABG surgery 15 years ago and PTCA 14 years ago.  We'll continue our efforts to optimally control cardiovascular risk factors.

## 2013-03-25 ENCOUNTER — Encounter: Payer: Self-pay | Admitting: Cardiology

## 2013-03-25 DIAGNOSIS — N184 Chronic kidney disease, stage 4 (severe): Secondary | ICD-10-CM | POA: Insufficient documentation

## 2013-03-26 ENCOUNTER — Telehealth: Payer: Self-pay | Admitting: *Deleted

## 2013-03-26 DIAGNOSIS — E785 Hyperlipidemia, unspecified: Secondary | ICD-10-CM

## 2013-03-26 DIAGNOSIS — G9001 Carotid sinus syncope: Secondary | ICD-10-CM

## 2013-03-26 DIAGNOSIS — Z954 Presence of other heart-valve replacement: Secondary | ICD-10-CM

## 2013-03-26 DIAGNOSIS — I739 Peripheral vascular disease, unspecified: Secondary | ICD-10-CM

## 2013-03-26 DIAGNOSIS — R001 Bradycardia, unspecified: Secondary | ICD-10-CM

## 2013-03-26 DIAGNOSIS — I10 Essential (primary) hypertension: Secondary | ICD-10-CM

## 2013-03-26 NOTE — Telephone Encounter (Signed)
Message copied by Thompson Grayer on Thu Mar 26, 2013  8:57 AM ------      Message from: Kathlen Brunswick      Created: Wed Mar 25, 2013  1:32 AM      Regarding: Medication       Increase rosuvastatin to 20 mg per day.      Patient to call if he develops myalgias.      Fasting lipid profile in one month. ------

## 2013-03-26 NOTE — Telephone Encounter (Signed)
Spoke to patient concerning lab/test results/instructions from provider. Patient understood.    

## 2013-04-01 ENCOUNTER — Encounter: Payer: Self-pay | Admitting: Pulmonary Disease

## 2013-04-16 ENCOUNTER — Other Ambulatory Visit: Payer: Self-pay | Admitting: *Deleted

## 2013-04-16 ENCOUNTER — Encounter: Payer: Self-pay | Admitting: *Deleted

## 2013-04-16 DIAGNOSIS — E785 Hyperlipidemia, unspecified: Secondary | ICD-10-CM

## 2013-04-16 DIAGNOSIS — R001 Bradycardia, unspecified: Secondary | ICD-10-CM

## 2013-04-16 DIAGNOSIS — I739 Peripheral vascular disease, unspecified: Secondary | ICD-10-CM

## 2013-04-16 DIAGNOSIS — I10 Essential (primary) hypertension: Secondary | ICD-10-CM

## 2013-04-16 DIAGNOSIS — G9001 Carotid sinus syncope: Secondary | ICD-10-CM

## 2013-04-16 DIAGNOSIS — Z954 Presence of other heart-valve replacement: Secondary | ICD-10-CM

## 2013-04-17 ENCOUNTER — Encounter (HOSPITAL_COMMUNITY): Payer: Medicare Other

## 2013-04-17 ENCOUNTER — Encounter (HOSPITAL_COMMUNITY): Payer: Medicare Other | Attending: Hematology and Oncology

## 2013-04-17 ENCOUNTER — Encounter (HOSPITAL_COMMUNITY): Payer: Self-pay

## 2013-04-17 VITALS — BP 106/41 | HR 61 | Temp 98.5°F | Resp 14 | Wt 197.4 lb

## 2013-04-17 DIAGNOSIS — D61818 Other pancytopenia: Secondary | ICD-10-CM

## 2013-04-17 DIAGNOSIS — C91Z0 Other lymphoid leukemia not having achieved remission: Secondary | ICD-10-CM | POA: Insufficient documentation

## 2013-04-17 DIAGNOSIS — D72820 Lymphocytosis (symptomatic): Secondary | ICD-10-CM

## 2013-04-17 LAB — CBC WITH DIFFERENTIAL/PLATELET
Eosinophils Relative: 1 % (ref 0–5)
HCT: 34.6 % — ABNORMAL LOW (ref 39.0–52.0)
Hemoglobin: 11.5 g/dL — ABNORMAL LOW (ref 13.0–17.0)
Lymphocytes Relative: 45 % (ref 12–46)
MCV: 88.3 fL (ref 78.0–100.0)
Monocytes Absolute: 0.4 10*3/uL (ref 0.1–1.0)
Monocytes Relative: 11 % (ref 3–12)
Neutro Abs: 1.5 10*3/uL — ABNORMAL LOW (ref 1.7–7.7)
WBC: 3.5 10*3/uL — ABNORMAL LOW (ref 4.0–10.5)

## 2013-04-17 NOTE — Patient Instructions (Addendum)
[facility] Discharge Instructions  RECOMMENDATIONS MADE BY THE CONSULTANT AND ANY TEST RESULTS WILL BE SENT TO YOUR REFERRING PHYSICIAN.  EXAM FINDINGS BY THE PHYSICIAN TODAY AND SIGNS OR SYMPTOMS TO REPORT TO CLINIC OR PRIMARY PHYSICIAN: discussion by Dr. Sharia Reeve.  No treatment needed at present.  MEDICATIONS PRESCRIBED:  none  INSTRUCTIONS GIVEN AND DISCUSSED: Report unexplained fevers, weight loss, etc.  SPECIAL INSTRUCTIONS/FOLLOW-UP: Follow-up in 6 months.  Thank you for choosing [facility] to provide your oncology and hematology care.  To afford each patient quality time with our providers, please arrive at least 15 minutes before your scheduled appointment time.  With your help, our goal is to use those 15 minutes to complete the necessary work-up to ensure our physicians have the information they need to help with your evaluation and healthcare recommendations.    Effective January 1st, 2014, we ask that you re-schedule your appointment with our physicians should you arrive 10 or more minutes late for your appointment.  We strive to give you quality time with our providers, and arriving late affects you and other patients whose appointments are after yours.    Again, thank you for choosing [facility].  Our hope is that these requests will decrease the amount of time that you wait before being seen by our physicians.       _____________________________________________________________  Should you have questions after your visit to [facility], please contact our office at [phone] between the hours of 8:30 a.m. and 5:00 p.m.  Voicemails left after 4:30 p.m. will not be returned until the following business day.  For prescription refill requests, have your pharmacy contact our office with your prescription refill request.

## 2013-04-17 NOTE — Progress Notes (Addendum)
      [facility] [phone]   [phone]  OFFICE PROGRESS NOTE  Fredirick Maudlin, MD [address] [place] [address]  DIAGNOSIS:  Large granular lymphocytosis      INTERVAL HISTORY:   [name] 79 y.o. male returns to the clinic today for follow up today.  She is accompanied by 2 of his daughters; Daniel Nones and [place]. He is an 79 year old elderly gentleman with history of pancytopenia who had a bone marrow aspiration and biopsy 09/02/1992 which showed hypocellular marrow with relatively increased in lymphocyte population. Peripheral blood flow cytometric dated 07/04/1993 showed abnormal T cell phenotype, CD8 positive with partial expression of CD16 and CD56 suggestive of larger on the lymphocytosis. Subsequent T-cell receptor Gene rearrangement did not show clonal T-cell population however it was stated that the DNA recovered from the peripheral blood cells limited study and presence of clonal T-cell could not be definitely excluded.  Following his last visit a repeat T-cell receptor gene rearrangement study was done which showed evidence consistent with oligoclonal T cell expansion.  He has no new problems since his last visit.  Her just to have a arthritic pain. He is eating well.  He denies any bruising or bleeding or fatigue .  He denies any recent infection.   REVIEW OF SYSTEMS: As in the history above otherwise negative.  PHYSICAL EXAMINATION:  Blood pressure 106/41, pulse 61, temperature 98.5 F (36.9 C), temperature source Oral, resp. rate 14, weight 197 lb 6.4 oz (89.54 kg). GENERAL: No acute distress. LUNGS: Clear to auscultation , no crackles or wheezes HEART: regular rate & rhythm, no murmurs, no gallops, S1 normal and S2 normal and no S3. ABDOMEN: Abdomen soft, non-tender, no masses or organomegaly and no hepatosplenomegaly palpable EXTREMITIES:  1-2 +edema, no skin discoloration or  tenderness      LABORATORY DATA: Lab Results  Component Value Date   WBC 3.5* 04/17/2013   HGB 11.5* 04/17/2013   HCT 34.6* 04/17/2013   MCV 88.3 04/17/2013   PLT 129* 04/17/2013  ANC is 1.5.     ASSESSMENT:  Large granular lymphocytosis T-cell receptor gene arrangements shows oligoclonal T-cell expansion which does not necessarily exclude LGL leukemia. It is not clear if patient has a reactive large granular lymphocytosis or large granular lymphocytic leukemia. However I think this is of little relevance as regards treatment. Giving that he has normal serious cytopenias at this time is no indication for treatment.    PLAN:  1. Patient will return to clinic in 6 months. 2. No indication for treatment at this time.     All questions were satisfactorily answered. Patient knows to call if  any concern arises.  I spent more than 50 % counseling the patient face to face. The total time spent in the appointment was 30 minutes.   Sherral Hammers, MD FACP. Hematology/Oncology.

## 2013-04-20 ENCOUNTER — Telehealth: Payer: Self-pay | Admitting: Adult Health

## 2013-04-20 DIAGNOSIS — G9001 Carotid sinus syncope: Secondary | ICD-10-CM

## 2013-04-20 DIAGNOSIS — I2510 Atherosclerotic heart disease of native coronary artery without angina pectoris: Secondary | ICD-10-CM

## 2013-04-20 DIAGNOSIS — I739 Peripheral vascular disease, unspecified: Secondary | ICD-10-CM

## 2013-04-20 DIAGNOSIS — R001 Bradycardia, unspecified: Secondary | ICD-10-CM

## 2013-04-20 DIAGNOSIS — I10 Essential (primary) hypertension: Secondary | ICD-10-CM

## 2013-04-20 NOTE — Telephone Encounter (Signed)
Spoke to Pt daughter. Advised her we change Crestor to 20 mg daily and that we needed a Fasting Lipid done and not CBC. Pt advised that her father has not been taking Crestor 20 mg ; he is taking the 5 mg. This nurse told pt that she called and advised the medication change. Pt daughter said that it must have been the other daughter and it was a mess up on their end. Please advise if your want the pt to start taking the crestor 20 mg and gets labs done later?    Message from: Kathlen Brunswick  Created: Wed Mar 25, 2013 1:32 AM  Regarding: Medication  Increase rosuvastatin to 20 mg per day.  Patient to call if he develops myalgias.  Fasting lipid profile in one month

## 2013-04-20 NOTE — Telephone Encounter (Signed)
Patient received letter stating he was due for labwork.  Patient was to have CBC.  [facility] It is in EPIC. / tgs

## 2013-04-21 NOTE — Telephone Encounter (Signed)
Yes. Institute plan as previously outlined.

## 2013-04-22 ENCOUNTER — Other Ambulatory Visit: Payer: Self-pay | Admitting: *Deleted

## 2013-04-22 MED ORDER — ROSUVASTATIN CALCIUM 20 MG PO TABS: 20.0000 mg | ORAL_TABLET | Freq: Every day | ORAL | Status: DC

## 2013-04-22 NOTE — Telephone Encounter (Signed)
Spoke to patient concerning lab/test results/instructions from provider. Patient understood.    

## 2013-05-15 ENCOUNTER — Other Ambulatory Visit: Payer: Self-pay

## 2013-05-15 MED ORDER — CHLORTHALIDONE 25 MG PO TABS: ORAL_TABLET | ORAL | Status: DC

## 2013-05-20 ENCOUNTER — Other Ambulatory Visit: Payer: Self-pay | Admitting: *Deleted

## 2013-05-20 ENCOUNTER — Encounter: Payer: Self-pay | Admitting: *Deleted

## 2013-05-20 DIAGNOSIS — I739 Peripheral vascular disease, unspecified: Secondary | ICD-10-CM

## 2013-05-20 DIAGNOSIS — I10 Essential (primary) hypertension: Secondary | ICD-10-CM

## 2013-05-20 DIAGNOSIS — I2510 Atherosclerotic heart disease of native coronary artery without angina pectoris: Secondary | ICD-10-CM

## 2013-05-20 DIAGNOSIS — G9001 Carotid sinus syncope: Secondary | ICD-10-CM

## 2013-05-20 DIAGNOSIS — R001 Bradycardia, unspecified: Secondary | ICD-10-CM

## 2013-05-23 LAB — LIPID PANEL
LDL Cholesterol: 60 mg/dL (ref 0–99)
Triglycerides: 146 mg/dL (ref <150)

## 2013-05-28 ENCOUNTER — Encounter: Payer: Self-pay | Admitting: *Deleted

## 2013-06-15 ENCOUNTER — Encounter: Payer: Self-pay | Admitting: Internal Medicine

## 2013-06-15 ENCOUNTER — Ambulatory Visit (INDEPENDENT_AMBULATORY_CARE_PROVIDER_SITE_OTHER): Payer: Medicare Other | Admitting: *Deleted

## 2013-06-15 DIAGNOSIS — R001 Bradycardia, unspecified: Secondary | ICD-10-CM

## 2013-06-15 DIAGNOSIS — I495 Sick sinus syndrome: Secondary | ICD-10-CM

## 2013-06-15 DIAGNOSIS — Z950 Presence of cardiac pacemaker: Secondary | ICD-10-CM

## 2013-06-16 LAB — REMOTE PACEMAKER DEVICE
ATRIAL PACING PM: 73
BAMS-0001: 170 {beats}/min
BAMS-0003: 70 {beats}/min
RV LEAD THRESHOLD: 1 V
VENTRICULAR PACING PM: 21

## 2013-06-23 ENCOUNTER — Encounter: Payer: Self-pay | Admitting: *Deleted

## 2013-08-10 ENCOUNTER — Other Ambulatory Visit: Payer: Self-pay | Admitting: Cardiology

## 2013-09-14 ENCOUNTER — Encounter: Payer: Self-pay | Admitting: Internal Medicine

## 2013-09-14 ENCOUNTER — Encounter: Payer: Medicare Other | Admitting: *Deleted

## 2013-09-14 DIAGNOSIS — R001 Bradycardia, unspecified: Secondary | ICD-10-CM

## 2013-09-14 LAB — MDC_IDC_ENUM_SESS_TYPE_REMOTE
Brady Statistic AP VS Percent: 60 %
Brady Statistic RA Percent Paced: 73 %
Date Time Interrogation Session: 20150119101559
Lead Channel Impedance Value: 410 Ohm
Lead Channel Impedance Value: 490 Ohm
Lead Channel Pacing Threshold Pulse Width: 0.4 ms
Lead Channel Sensing Intrinsic Amplitude: 2.1 mV
Lead Channel Setting Pacing Amplitude: 1.25 V
Lead Channel Setting Pacing Pulse Width: 0.4 ms
Lead Channel Setting Sensing Sensitivity: 2 mV
MDC IDC MSMT BATTERY REMAINING LONGEVITY: 84 mo
MDC IDC MSMT BATTERY VOLTAGE: 2.93 V
MDC IDC MSMT LEADCHNL RA PACING THRESHOLD AMPLITUDE: 0.625 V
MDC IDC MSMT LEADCHNL RA PACING THRESHOLD PULSEWIDTH: 0.4 ms
MDC IDC MSMT LEADCHNL RV PACING THRESHOLD AMPLITUDE: 1 V
MDC IDC MSMT LEADCHNL RV SENSING INTR AMPL: 12 mV
MDC IDC PG SERIAL: 7354238
MDC IDC SET LEADCHNL RA PACING AMPLITUDE: 1.625
MDC IDC STAT BRADY AP VP PERCENT: 19 %
MDC IDC STAT BRADY AS VP PERCENT: 1.6 %
MDC IDC STAT BRADY AS VS PERCENT: 13 %
MDC IDC STAT BRADY RV PERCENT PACED: 20 %

## 2013-09-22 ENCOUNTER — Encounter: Payer: Self-pay | Admitting: *Deleted

## 2013-10-16 ENCOUNTER — Ambulatory Visit (HOSPITAL_COMMUNITY): Payer: Medicare Other

## 2013-10-19 ENCOUNTER — Encounter (HOSPITAL_COMMUNITY): Payer: Self-pay

## 2013-10-19 ENCOUNTER — Encounter (HOSPITAL_COMMUNITY): Payer: Medicare Other | Attending: Hematology and Oncology

## 2013-10-19 ENCOUNTER — Encounter (HOSPITAL_COMMUNITY): Payer: Medicare Other

## 2013-10-19 DIAGNOSIS — Z950 Presence of cardiac pacemaker: Secondary | ICD-10-CM | POA: Insufficient documentation

## 2013-10-19 DIAGNOSIS — D61818 Other pancytopenia: Secondary | ICD-10-CM

## 2013-10-19 DIAGNOSIS — I442 Atrioventricular block, complete: Secondary | ICD-10-CM | POA: Insufficient documentation

## 2013-10-19 DIAGNOSIS — I2510 Atherosclerotic heart disease of native coronary artery without angina pectoris: Secondary | ICD-10-CM | POA: Insufficient documentation

## 2013-10-19 DIAGNOSIS — C91Z0 Other lymphoid leukemia not having achieved remission: Secondary | ICD-10-CM

## 2013-10-19 DIAGNOSIS — M549 Dorsalgia, unspecified: Secondary | ICD-10-CM

## 2013-10-19 DIAGNOSIS — Z09 Encounter for follow-up examination after completed treatment for conditions other than malignant neoplasm: Secondary | ICD-10-CM | POA: Insufficient documentation

## 2013-10-19 DIAGNOSIS — Z951 Presence of aortocoronary bypass graft: Secondary | ICD-10-CM | POA: Insufficient documentation

## 2013-10-19 DIAGNOSIS — G8929 Other chronic pain: Secondary | ICD-10-CM

## 2013-10-19 LAB — CBC WITH DIFFERENTIAL/PLATELET
BASOS ABS: 0 10*3/uL (ref 0.0–0.1)
BASOS PCT: 0 % (ref 0–1)
EOS ABS: 0.1 10*3/uL (ref 0.0–0.7)
EOS PCT: 2 % (ref 0–5)
HEMATOCRIT: 37.2 % — AB (ref 39.0–52.0)
HEMOGLOBIN: 12 g/dL — AB (ref 13.0–17.0)
Lymphocytes Relative: 55 % — ABNORMAL HIGH (ref 12–46)
Lymphs Abs: 1.9 10*3/uL (ref 0.7–4.0)
MCH: 28.4 pg (ref 26.0–34.0)
MCHC: 32.3 g/dL (ref 30.0–36.0)
MCV: 87.9 fL (ref 78.0–100.0)
MONO ABS: 0.3 10*3/uL (ref 0.1–1.0)
MONOS PCT: 8 % (ref 3–12)
NEUTROS ABS: 1.2 10*3/uL — AB (ref 1.7–7.7)
Neutrophils Relative %: 35 % — ABNORMAL LOW (ref 43–77)
Platelets: 117 10*3/uL — ABNORMAL LOW (ref 150–400)
RBC: 4.23 MIL/uL (ref 4.22–5.81)
RDW: 14.9 % (ref 11.5–15.5)
WBC: 3.4 10*3/uL — ABNORMAL LOW (ref 4.0–10.5)

## 2013-10-19 NOTE — Patient Instructions (Signed)
[facility] Discharge Instructions  RECOMMENDATIONS MADE BY THE CONSULTANT AND ANY TEST RESULTS WILL BE SENT TO YOUR REFERRING PHYSICIAN.  Return in 6 months for follow-up visit and lab work.    Thank you for choosing [facility] to provide your oncology and hematology care.  To afford each patient quality time with our providers, please arrive at least 15 minutes before your scheduled appointment time.  With your help, our goal is to use those 15 minutes to complete the necessary work-up to ensure our physicians have the information they need to help with your evaluation and healthcare recommendations.    Effective January 1st, 2014, we ask that you re-schedule your appointment with our physicians should you arrive 10 or more minutes late for your appointment.  We strive to give you quality time with our providers, and arriving late affects you and other patients whose appointments are after yours.    Again, thank you for choosing [facility].  Our hope is that these requests will decrease the amount of time that you wait before being seen by our physicians.       _____________________________________________________________  Should you have questions after your visit to [facility], please contact our office at [phone] between the hours of 8:30 a.m. and 5:00 p.m.  Voicemails left after 4:30 p.m. will not be returned until the following business day.  For prescription refill requests, have your pharmacy contact our office with your prescription refill request.

## 2013-10-19 NOTE — Progress Notes (Signed)
[place]  OFFICE PROGRESS NOTE  [name], MD. [address] [place] [address]  DIAGNOSIS: Large granular lymphocyte disorder - Plan: Immunophenotyping by Flow Cytometry  Chief Complaint  Patient presents with  . Follow-up  . Large granular lymphocytic leukemia    CURRENT THERAPY: Watchful expectation  INTERVAL HISTORY: [name] 80 y.o. 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 He lives at home and is seen by his daughters on a regular basis. He does go to adult daycare each day. Appetite is good with no nausea, vomiting, PND, orthopnea, palpitations, or significant easy satiety. He denies any lower extremity swelling that is worsening, fever, night sweats, cough, wheezing, sore throat, skin rash, but does have chronic back pain for which he uses a cane for support. He denies any diarrhea, constipation, or incontinence. He is a denies any urinary hesitancy, hematuria, or significant nocturia.   MEDICAL HISTORY: Past Medical History  Diagnosis Date  . Arteriosclerotic cardiovascular disease (ASCVD)     CABG & bioprosthetic aortic valve replace in 1999. PCI of right coronary in 2000, catheter in 07/2002 revealed total occlusion of the LAD; LIMA-LAD patent; patent SVG-OM1, SVG-diagonal ramus, PDA occluded; patent Cx stent, distal 30-40% stenosis Cx; nondom RCA, prox RCA 20-30% lesion, distal RCA 90% lesion; normal EF  . TIA (transient ischemic attack)   . Hypertension     normal CMet and 2011  . Hyperlipidemia     Lipid profile in 12/2009-170, 73, 79, 76  . PVD (peripheral vascular disease)   . Parkinson disease 2010    mild; + dementia  . History of sinus bradycardia     induced by beta blocker  . History of syncope   . Chronic lymphatic leukemia     mild pancytopenia in 02/2010;hemoglobin-12.9, WBC-3.3, platelets-109  . Hepatitis   .  Glaucoma 09/1994  . Tobacco abuse, in remission     20 pack years; discontinued 50 years ago  . Insomnia     Middle of the night awakening  . Coronary artery disease   . Dysrhythmia     Bradycardia  . Shortness of breath   . GERD (gastroesophageal reflux disease)     INTERIM HISTORY: has ATHEROSCLEROTIC CARDIOVASCULAR DISEASE; TIA; PERIPHERAL VASCULAR DISEASE; AORTIC VALVE REPLACEMENT, HX OF; Tobacco abuse, in remission; Parkinson disease; Hypertension; Hyperlipidemia; Chronic lymphatic leukemia; Insomnia; Carotid artery hypersensitivity; Bradycardia, severe sinus; Pacemaker-St.Jude; Fasting hyperglycemia; and Chronic kidney disease, stage 3 on his problem list.   History of pancytopenia who had a bone marrow aspiration and biopsy 09/02/1992 which showed hypocellular marrow with relatively increased in lymphocyte population. Peripheral blood flow cytometric dated 07/04/1993 showed abnormal T cell phenotype, CD8 positive with partial expression of CD16 and CD56 suggestive of large granular cell lymphocytosis.  Subsequent T-cell receptor Gene rearrangement did not show clonal T-cell population however it was stated that the DNA recovered from the peripheral blood cells limited study and presence of clonal T-cell could not be definitely excluded.  ALLERGIES:  is allergic to shellfish allergy and ambien.  MEDICATIONS: has a current medication list which includes the following prescription(s): amlodipine, aspirin, carbidopa-levodopa, chlorthalidone, combigan, crestor, donepezil, furosemide, ipratropium, lisinopril-hydrochlorothiazide, melatonin, and fluticasone.  SURGICAL HISTORY:  Past Surgical History  Procedure Laterality Date  . Retinal detachment surgery  1989  . Tonsillectomy    . Transurethral resection of prostate      Benign prostatic hypertrophy  .  Coronary artery bypass graft  1999    + Bioprosthetic AVR  . Colonoscopy  2011  . A-v cardiac pacemaker insertion  02/2012    St. Jude  [employer] DR RF    FAMILY HISTORY: family history includes Breast cancer in his sister; Cancer in his brother; Diabetes in his sister; Pneumonia in his father.  SOCIAL HISTORY:  reports that he has quit smoking. He has never used smokeless tobacco. He reports that he drinks alcohol. He reports that he does not use illicit drugs.  REVIEW OF SYSTEMS:  Other than that discussed above is noncontributory.  PHYSICAL EXAMINATION: ECOG PERFORMANCE STATUS: 1 - Symptomatic but completely ambulatory  There were no vitals taken for this visit.  GENERAL:alert, no distress and comfortable SKIN: skin color, texture, turgor are normal, no rashes or significant lesions EYES: PERLA; Conjunctiva are pink and non-injected, sclera clear OROPHARYNX:no exudate, no erythema on lips, buccal mucosa, or tongue. NECK: supple, thyroid normal size, non-tender, without nodularity. No masses CHEST: Midline sternotomy scar with pacemaker in place. Bilateral gynecomastia. LYMPH:  no palpable lymphadenopathy in the cervical, axillary or inguinal LUNGS: clear to auscultation and percussion with normal breathing effort HEART: regular rate & rhythm and no murmurs. No S3. Paradoxical split of S2. ABDOMEN:abdomen soft, non-tender and normal bowel sounds. Liver spleen not palpable. MUSCULOSKELETAL:no cyanosis of digits and no clubbing. Range of motion normal.  NEURO: alert & oriented x 3 with fluent speech, no focal motor/sensory deficits. Decreased hearing acuity.   LABORATORY DATA: Appointment on 10/19/2013  Component Date Value Ref Range Status  . WBC 10/19/2013 3.4* 4.0 - 10.5 K/uL Final  . RBC 10/19/2013 4.23  4.22 - 5.81 MIL/uL Final  . Hemoglobin 10/19/2013 12.0* 13.0 - 17.0 g/dL Final  . HCT 10/19/2013 37.2* 39.0 - 52.0 % Final  . MCV 10/19/2013 87.9  78.0 - 100.0 fL Final  . MCH 10/19/2013 28.4  26.0 - 34.0 pg Final  . MCHC 10/19/2013 32.3  30.0 - 36.0 g/dL Final  . RDW 10/19/2013 14.9  11.5 - 15.5 % Final  .  Platelets 10/19/2013 117* 150 - 400 K/uL Final   Comment: SPECIMEN CHECKED FOR CLOTS                          PLATELET COUNT CONFIRMED BY SMEAR  . Neutrophils Relative % 10/19/2013 35* 43 - 77 % Final  . Neutro Abs 10/19/2013 1.2* 1.7 - 7.7 K/uL Final  . Lymphocytes Relative 10/19/2013 55* 12 - 46 % Final  . Lymphs Abs 10/19/2013 1.9  0.7 - 4.0 K/uL Final  . Monocytes Relative 10/19/2013 8  3 - 12 % Final  . Monocytes Absolute 10/19/2013 0.3  0.1 - 1.0 K/uL Final  . Eosinophils Relative 10/19/2013 2  0 - 5 % Final  . Eosinophils Absolute 10/19/2013 0.1  0.0 - 0.7 K/uL Final  . Basophils Relative 10/19/2013 0  0 - 1 % Final  . Basophils Absolute 10/19/2013 0.0  0.0 - 0.1 K/uL Final    PATHOLOGY: Peripheral smear failed to real evidence of hairy cells or premature forms.  Urinalysis    Component Value Date/Time   COLORURINE YELLOW 08/05/2012 1300   APPEARANCEUR CLEAR 08/05/2012 1300   LABSPEC 1.015 08/05/2012 1300   PHURINE 6.5 08/05/2012 1300   GLUCOSEU NEGATIVE 08/05/2012 1300   HGBUR NEGATIVE 08/05/2012 1300   BILIRUBINUR NEGATIVE 08/05/2012 [place] 08/05/2012 1300   PROTEINUR NEGATIVE 08/05/2012 1300   UROBILINOGEN  0.2 08/05/2012 1300   NITRITE NEGATIVE 08/05/2012 1300   LEUKOCYTESUR NEGATIVE 08/05/2012 1300    RADIOGRAPHIC STUDIES: No results found.  ASSESSMENT:  #1. 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 Manifestation is as pancytopenia without signs or symptoms of significant infection, bleeding, or bruising. #2. Complete heart block, status post pacemaker. #3. Coronary artery disease, status post quadruple bypass.   PLAN:  #1. Additional lab tests will be done today and his daughter will be contacted should any intervention be necessary. #2. She was warned to call should he develop fever, increasing fatigue, bleeding, or easy bruising. #3. Followup in 6 months with CBC.   All questions were answered. The  patient knows to call the clinic with any problems, questions or concerns. We can certainly see the patient much sooner if necessary.   I spent 25 minutes counseling the patient face to face. The total time spent in the appointment was 30 minutes.    Doroteo Bradford, MD 10/20/2013 7:02 AM

## 2013-10-27 ENCOUNTER — Encounter: Payer: Self-pay | Admitting: Internal Medicine

## 2013-10-28 ENCOUNTER — Encounter: Payer: Medicare Other | Admitting: Internal Medicine

## 2013-10-28 ENCOUNTER — Encounter: Payer: Self-pay | Admitting: Internal Medicine

## 2013-10-28 ENCOUNTER — Ambulatory Visit (INDEPENDENT_AMBULATORY_CARE_PROVIDER_SITE_OTHER): Payer: Medicare Other | Admitting: Internal Medicine

## 2013-10-28 VITALS — BP 118/38 | HR 61 | Ht 63.0 in | Wt 177.8 lb

## 2013-10-28 DIAGNOSIS — I4891 Unspecified atrial fibrillation: Secondary | ICD-10-CM

## 2013-10-28 DIAGNOSIS — Z950 Presence of cardiac pacemaker: Secondary | ICD-10-CM

## 2013-10-28 DIAGNOSIS — R001 Bradycardia, unspecified: Secondary | ICD-10-CM

## 2013-10-28 DIAGNOSIS — I495 Sick sinus syndrome: Secondary | ICD-10-CM

## 2013-10-28 LAB — MDC_IDC_ENUM_SESS_TYPE_INCLINIC
Date Time Interrogation Session: 20150304091606
Implantable Pulse Generator Model: 2210
Implantable Pulse Generator Serial Number: 7354238
Lead Channel Pacing Threshold Amplitude: 0.625 V
Lead Channel Pacing Threshold Amplitude: 1 V
Lead Channel Pacing Threshold Pulse Width: 0.4 ms
Lead Channel Sensing Intrinsic Amplitude: 12 mV
MDC IDC MSMT BATTERY REMAINING LONGEVITY: 81.6 mo
MDC IDC MSMT BATTERY VOLTAGE: 2.93 V
MDC IDC MSMT LEADCHNL RA IMPEDANCE VALUE: 450 Ohm
MDC IDC MSMT LEADCHNL RA SENSING INTR AMPL: 3 mV
MDC IDC MSMT LEADCHNL RV IMPEDANCE VALUE: 450 Ohm
MDC IDC MSMT LEADCHNL RV PACING THRESHOLD PULSEWIDTH: 0.4 ms
MDC IDC SET LEADCHNL RA PACING AMPLITUDE: 1.625
MDC IDC SET LEADCHNL RV PACING AMPLITUDE: 2.5 V
MDC IDC SET LEADCHNL RV PACING PULSEWIDTH: 0.4 ms
MDC IDC SET LEADCHNL RV SENSING SENSITIVITY: 2 mV
MDC IDC STAT BRADY RA PERCENT PACED: 71 %
MDC IDC STAT BRADY RV PERCENT PACED: 18 %

## 2013-10-28 MED ORDER — APIXABAN 2.5 MG PO TABS: 2.5000 mg | ORAL_TABLET | Freq: Two times a day (BID) | ORAL | Status: DC

## 2013-10-28 NOTE — Assessment & Plan Note (Signed)
This is a new problem for which he is asymptomatic. I have discussed the treatment options with the patient and his daughter and recommended starting low dose eliquis for thromboembolic protection. Will stop ASA.

## 2013-10-28 NOTE — Assessment & Plan Note (Signed)
His St. Jude DDD PM is working normally. Will recheck in several months. 

## 2013-10-28 NOTE — Patient Instructions (Addendum)
Your physician recommends that you schedule a follow-up appointment in: July with Dr [name] will receive a reminder letter two months in advance reminding you to call and schedule your appointment. If you don't receive this letter, please contact our office.  Remote monitoring is used to monitor your Pacemaker or ICD from home. This monitoring reduces the number of office visits required to check your device to one time per year. It allows [place] to keep an eye on the functioning of your device to ensure it is working properly. You are scheduled for a device check from home on 01-28-14. You may send your transmission at any time that day. If you have a wireless device, the transmission will be sent automatically. After your physician reviews your transmission, you will receive a postcard with your next transmission date.  Your physician has recommended you make the following change in your medication:  STOP Asprin 81 mg daily START Eliquis 2.5 mg twice a day

## 2013-10-28 NOTE — Progress Notes (Signed)
HPI [name] returns for followup. He is a pleasant elderly man with a h/o symptomatic bradycardia, s/p PPM, CAD, s/p CABG, and HTN. In the interim, he has done well. He denies syncope, sob or chest pain. The patient was noted to have PAF on his PPM interogation lasting over an hour on several occaisions. He has not fallen but does note some peripheral edema. He is on lasix 4 times a week but has been limited by chronic renal insufficiency.  Allergies  Allergen Reactions  . Shellfish Allergy Anaphylaxis  . Ambien [Zolpidem Tartrate]      Current Outpatient Prescriptions  Medication Sig Dispense Refill  . amLODipine (NORVASC) 5 MG tablet TAKE 1/2 TABLET BY MOUTH EVERY DAY  90 tablet  2  . aspirin 81 MG tablet Take 81 mg by mouth daily.      . carbidopa-levodopa (SINEMET) 25-100 MG per tablet Take 1 tablet by mouth 2 (two) times daily.       . chlorthalidone (HYGROTON) 25 MG tablet TAKE 1/2 TABLET BY MOUTH EVERY DAY  45 tablet  6  . COMBIGAN 0.2-0.5 % ophthalmic solution Place 1 drop into both eyes every 12 (twelve) hours.       . CRESTOR 20 MG tablet TAKE 1 TABLET BY MOUTH EVERY DAY  30 tablet  3  . donepezil (ARICEPT) 10 MG tablet Take 10 mg by mouth at bedtime.       . furosemide (LASIX) 40 MG tablet 40 mg. Sunday, Tuesdays, Thursdays, & Saturdays      . ipratropium (ATROVENT) 0.06 % nasal spray       . lisinopril-hydrochlorothiazide (PRINZIDE,ZESTORETIC) 20-12.5 MG per tablet Take 1 tablet by mouth daily.      . Melatonin 5 MG CAPS Take 1 capsule by mouth at bedtime.       No current facility-administered medications for this visit.     Past Medical History  Diagnosis Date  . Arteriosclerotic cardiovascular disease (ASCVD)     CABG & bioprosthetic aortic valve replace in 1999. PCI of right coronary in 2000, catheter in 07/2002 revealed total occlusion of the LAD; LIMA-LAD patent; patent SVG-OM1, SVG-diagonal ramus, PDA occluded; patent Cx stent, distal 30-40% stenosis Cx; nondom  RCA, prox RCA 20-30% lesion, distal RCA 90% lesion; normal EF  . TIA (transient ischemic attack)   . Hypertension     normal CMet and 2011  . Hyperlipidemia     Lipid profile in 12/2009-170, 73, 79, 76  . PVD (peripheral vascular disease)   . Parkinson disease 2010    mild; + dementia  . History of sinus bradycardia     induced by beta blocker  . History of syncope   . Chronic lymphatic leukemia     mild pancytopenia in 02/2010;hemoglobin-12.9, WBC-3.3, platelets-109  . Hepatitis   . Glaucoma 09/1994  . Tobacco abuse, in remission     20  pack years; discontinued 50 years ago  . Insomnia     Middle of the night awakening  . Coronary artery disease   . Dysrhythmia     Bradycardia  . Shortness of breath   . GERD (gastroesophageal reflux disease)     ROS:   All systems reviewed and negative except as noted in the HPI.   Past Surgical History  Procedure Laterality Date  . Retinal detachment surgery  1989  . Tonsillectomy    . Transurethral resection of prostate      Benign prostatic hypertrophy  . Coronary artery bypass graft  1999    +  Bioprosthetic AVR  . Colonoscopy  2011  . A-v cardiac pacemaker insertion  02/2012    [employer] Jude Accent DR RF     Family History  Problem Relation Age of Onset  . Pneumonia Father   . Cancer Brother   . Breast cancer Sister   . Diabetes Sister     complications     History   Social History  . Marital Status: Widowed    Spouse Name: N/A    Number of Children: N/A  . Years of Education: N/A   Occupational History  . Not on file.   Social History Main Topics  . Smoking status: Former Research scientist (life sciences)  . Smokeless tobacco: Never Used  . Alcohol Use: Yes     Comment: rare  . Drug Use: No  . Sexual Activity: Not Currently   Other Topics Concern  . Not on file   Social History Narrative   Retired. Regularly exercises. Has 3 daughters alive and well.      BP 118/38  Pulse 61  Ht 5\' 3"  (1.6 m)  Wt 177 lb 12.8 oz (80.65 kg)  BMI  31.50 kg/m2  Physical Exam:  Well appearing elderly man, NAD HEENT: Unremarkable Neck:  7 cm JVD, no thyromegally Back:  No CVA tenderness Lungs:  Clear with no wheezes. HEART:  Regular rate rhythm, grade 2/6 high pitched systolic murmur, no rubs, no clicks Abd:  soft, positive bowel sounds, no organomegally, no rebound, no guarding Ext:  2 plus pulses, no edema, no cyanosis, no clubbing Skin:  No rashes no nodules Neuro:  CN II through XII intact, motor grossly intact   DEVICE  Normal device function.  See PaceArt for details.   Assess/Plan:

## 2013-11-06 ENCOUNTER — Encounter: Payer: Self-pay | Admitting: Internal Medicine

## 2013-12-31 ENCOUNTER — Telehealth: Payer: Self-pay | Admitting: Internal Medicine

## 2013-12-31 NOTE — Telephone Encounter (Signed)
New Message:  Pt's daughter is calling to see if we received a remote check from April. I'm not seeing the pt was scheduled for one.. Pt requests a call back from a device tech

## 2014-01-01 NOTE — Telephone Encounter (Signed)
Remote was received. Updated pt's contact that Mr. Twilley's monitor is 100% automatic as long as it remains connected. Explained that pt is not required to stay in range of monitor at all times; remote will search for pt automatically.   ROV w/ Dr. Lovena Le 03/02/14.

## 2014-02-12 ENCOUNTER — Other Ambulatory Visit: Payer: Self-pay | Admitting: Adult Health

## 2014-03-02 ENCOUNTER — Ambulatory Visit (INDEPENDENT_AMBULATORY_CARE_PROVIDER_SITE_OTHER): Payer: Medicare Other | Admitting: Internal Medicine

## 2014-03-02 ENCOUNTER — Encounter: Payer: Self-pay | Admitting: Internal Medicine

## 2014-03-02 VITALS — BP 150/40 | HR 66 | Ht 66.0 in | Wt 176.0 lb

## 2014-03-02 DIAGNOSIS — I495 Sick sinus syndrome: Secondary | ICD-10-CM

## 2014-03-02 DIAGNOSIS — I10 Essential (primary) hypertension: Secondary | ICD-10-CM

## 2014-03-02 DIAGNOSIS — R001 Bradycardia, unspecified: Secondary | ICD-10-CM

## 2014-03-02 DIAGNOSIS — I359 Nonrheumatic aortic valve disorder, unspecified: Secondary | ICD-10-CM

## 2014-03-02 DIAGNOSIS — I352 Nonrheumatic aortic (valve) stenosis with insufficiency: Secondary | ICD-10-CM

## 2014-03-02 DIAGNOSIS — I4891 Unspecified atrial fibrillation: Secondary | ICD-10-CM

## 2014-03-02 DIAGNOSIS — I480 Paroxysmal atrial fibrillation: Secondary | ICD-10-CM

## 2014-03-02 DIAGNOSIS — Z950 Presence of cardiac pacemaker: Secondary | ICD-10-CM

## 2014-03-02 LAB — MDC_IDC_ENUM_SESS_TYPE_INCLINIC
Battery Remaining Longevity: 106.8 mo
Battery Voltage: 2.93 V
Brady Statistic RV Percent Paced: 6.4 %
Implantable Pulse Generator Model: 2210
Implantable Pulse Generator Serial Number: 7354238
Lead Channel Impedance Value: 475 Ohm
Lead Channel Pacing Threshold Amplitude: 0.75 V
Lead Channel Pacing Threshold Amplitude: 0.75 V
Lead Channel Pacing Threshold Pulse Width: 0.4 ms
Lead Channel Pacing Threshold Pulse Width: 0.4 ms
Lead Channel Sensing Intrinsic Amplitude: 12 mV
Lead Channel Sensing Intrinsic Amplitude: 2.5 mV
Lead Channel Setting Pacing Amplitude: 1.625
Lead Channel Setting Pacing Amplitude: 2.5 V
Lead Channel Setting Pacing Pulse Width: 0.4 ms
Lead Channel Setting Sensing Sensitivity: 2 mV
MDC IDC MSMT LEADCHNL RA IMPEDANCE VALUE: 437.5 Ohm
MDC IDC MSMT LEADCHNL RA PACING THRESHOLD AMPLITUDE: 0.625 V
MDC IDC MSMT LEADCHNL RA PACING THRESHOLD PULSEWIDTH: 0.4 ms
MDC IDC SESS DTM: 20150707163854
MDC IDC STAT BRADY RA PERCENT PACED: 72 %

## 2014-03-02 NOTE — Progress Notes (Signed)
HPI Mr. Axelson returns for followup. He is a pleasant elderly man with a h/o symptomatic bradycardia, s/p PPM, CAD, s/p CABG, and HTN. In the interim, he has done well. He denies syncope, sob or chest pain. He has not fallen but does note some peripheral edema. He is on lasix 4 times a week but has been limited by chronic renal insufficiency. He admits to dietary indiscretion. Allergies  Allergen Reactions  . Shellfish Allergy Anaphylaxis  . Ambien [Zolpidem Tartrate]      Current Outpatient Prescriptions  Medication Sig Dispense Refill  . apixaban (ELIQUIS) 2.5 MG TABS tablet Take 1 tablet (2.5 mg total) by mouth 2 (two) times daily.  60 tablet  6  . carbidopa-levodopa (SINEMET) 25-100 MG per tablet Take 1 tablet by mouth 2 (two) times daily.       . chlorthalidone (HYGROTON) 25 MG tablet TAKE 1/2 TABLET BY MOUTH EVERY DAY  45 tablet  6  . COMBIGAN 0.2-0.5 % ophthalmic solution Place 1 drop into both eyes daily.       . CRESTOR 20 MG tablet TAKE 1 TABLET BY MOUTH EVERY DAY  90 tablet  3  . donepezil (ARICEPT) 10 MG tablet Take 10 mg by mouth at bedtime.       . furosemide (LASIX) 40 MG tablet Take 40 mg on Tuesdays, Thursdays, & Saturdays      . ipratropium (ATROVENT) 0.06 % nasal spray Place 1 spray into both nostrils as needed.       Marland Kitchen lisinopril-hydrochlorothiazide (PRINZIDE,ZESTORETIC) 20-12.5 MG per tablet Take 1 tablet by mouth daily.       No current facility-administered medications for this visit.     Past Medical History  Diagnosis Date  . Arteriosclerotic cardiovascular disease (ASCVD)     CABG & bioprosthetic aortic valve replace in 1999. PCI of right coronary in 2000, catheter in 07/2002 revealed total occlusion of the LAD; LIMA-LAD patent; patent SVG-OM1, SVG-diagonal ramus, PDA occluded; patent Cx stent, distal 30-40% stenosis Cx; nondom RCA, prox RCA 20-30% lesion, distal RCA 90% lesion; normal EF  . TIA (transient ischemic attack)   . Hypertension     normal CMet  and 2011  . Hyperlipidemia     Lipid profile in 12/2009-170, 73, 79, 76  . PVD (peripheral vascular disease)   . Parkinson disease 2010    mild; + dementia  . History of sinus bradycardia     induced by beta blocker  . History of syncope   . Chronic lymphatic leukemia     mild pancytopenia in 02/2010;hemoglobin-12.9, WBC-3.3, platelets-109  . Hepatitis   . Glaucoma 09/1994  . Tobacco abuse, in remission     20 pack years; discontinued 50 years ago  . Insomnia     Middle of the night awakening  . Coronary artery disease   . Dysrhythmia     Bradycardia  . Shortness of breath   . GERD (gastroesophageal reflux disease)     ROS:   All systems reviewed and negative except as noted in the HPI.   Past Surgical History  Procedure Laterality Date  . Retinal detachment surgery  1989  . Tonsillectomy    . Transurethral resection of prostate      Benign prostatic hypertrophy  . Coronary artery bypass graft  1999    + Bioprosthetic AVR  . Colonoscopy  2011  . A-v cardiac pacemaker insertion  02/2012    [employer] Jude Accent DR RF     Family History  Problem  Relation Age of Onset  . Pneumonia Father   . Cancer Brother   . Breast cancer Sister   . Diabetes Sister     complications     History   Social History  . Marital Status: Widowed    Spouse Name: N/A    Number of Children: N/A  . Years of Education: N/A   Occupational History  . Not on file.   Social History Main Topics  . Smoking status: Former Research scientist (life sciences)  . Smokeless tobacco: Never Used  . Alcohol Use: Yes     Comment: rare  . Drug Use: No  . Sexual Activity: Not Currently   Other Topics Concern  . Not on file   Social History Narrative   Retired. Regularly exercises. Has 3 daughters alive and well.      BP 150/40  Pulse 66  Ht 5\' 6"  (1.676 m)  Wt 176 lb (79.833 kg)  BMI 28.42 kg/m2  Physical Exam:  Well appearing elderly man, NAD HEENT: Unremarkable Neck:  7 cm JVD, no thyromegally Back:  No CVA  tenderness Lungs:  Clear with no wheezes. HEART:  Regular rate rhythm, grade 2/6 high pitched diastolic murmur, no rubs, no clicks Abd:  soft, positive bowel sounds, no organomegally, no rebound, no guarding Ext:  2 plus pulses, no edema, no cyanosis, no clubbing Skin:  No rashes no nodules Neuro:  CN II through XII intact, motor grossly intact   DEVICE  Normal device function.  See PaceArt for details.   Assess/Plan:

## 2014-03-02 NOTE — Assessment & Plan Note (Signed)
His DDD PM is working normally. Will recheck in several months. 

## 2014-03-02 NOTE — Assessment & Plan Note (Signed)
He is mostly asymptomatic. I have encouraged the patient to reduce his sodium intake and use Lasix judiciously. His daughter is with him today and understands.

## 2014-03-02 NOTE — Assessment & Plan Note (Signed)
He is asymptomatic. He is tolerating systemic anti-coagulation. He is in NSR 99% of the time.

## 2014-03-02 NOTE — Assessment & Plan Note (Signed)
His blood pressure is well controlled. Will continue his current meds.  

## 2014-03-02 NOTE — Patient Instructions (Signed)
Your physician wants you to follow-up in:  12 months. You will receive a reminder letter in the mail two months in advance. If you don't receive a letter, please call our office to schedule the follow-up appointment.  Take salt off the table and do not use.

## 2014-03-31 IMAGING — CR DG CHEST 1V PORT
1 series · 1 of 1 positions shown · non-contrast
Comparison: One of 11.

CLINICAL DATA: Irregular heart rate.  Syncope, diaphoresis and
shortness of breath.

PORTABLE CHEST - 1 VIEW

[AP]
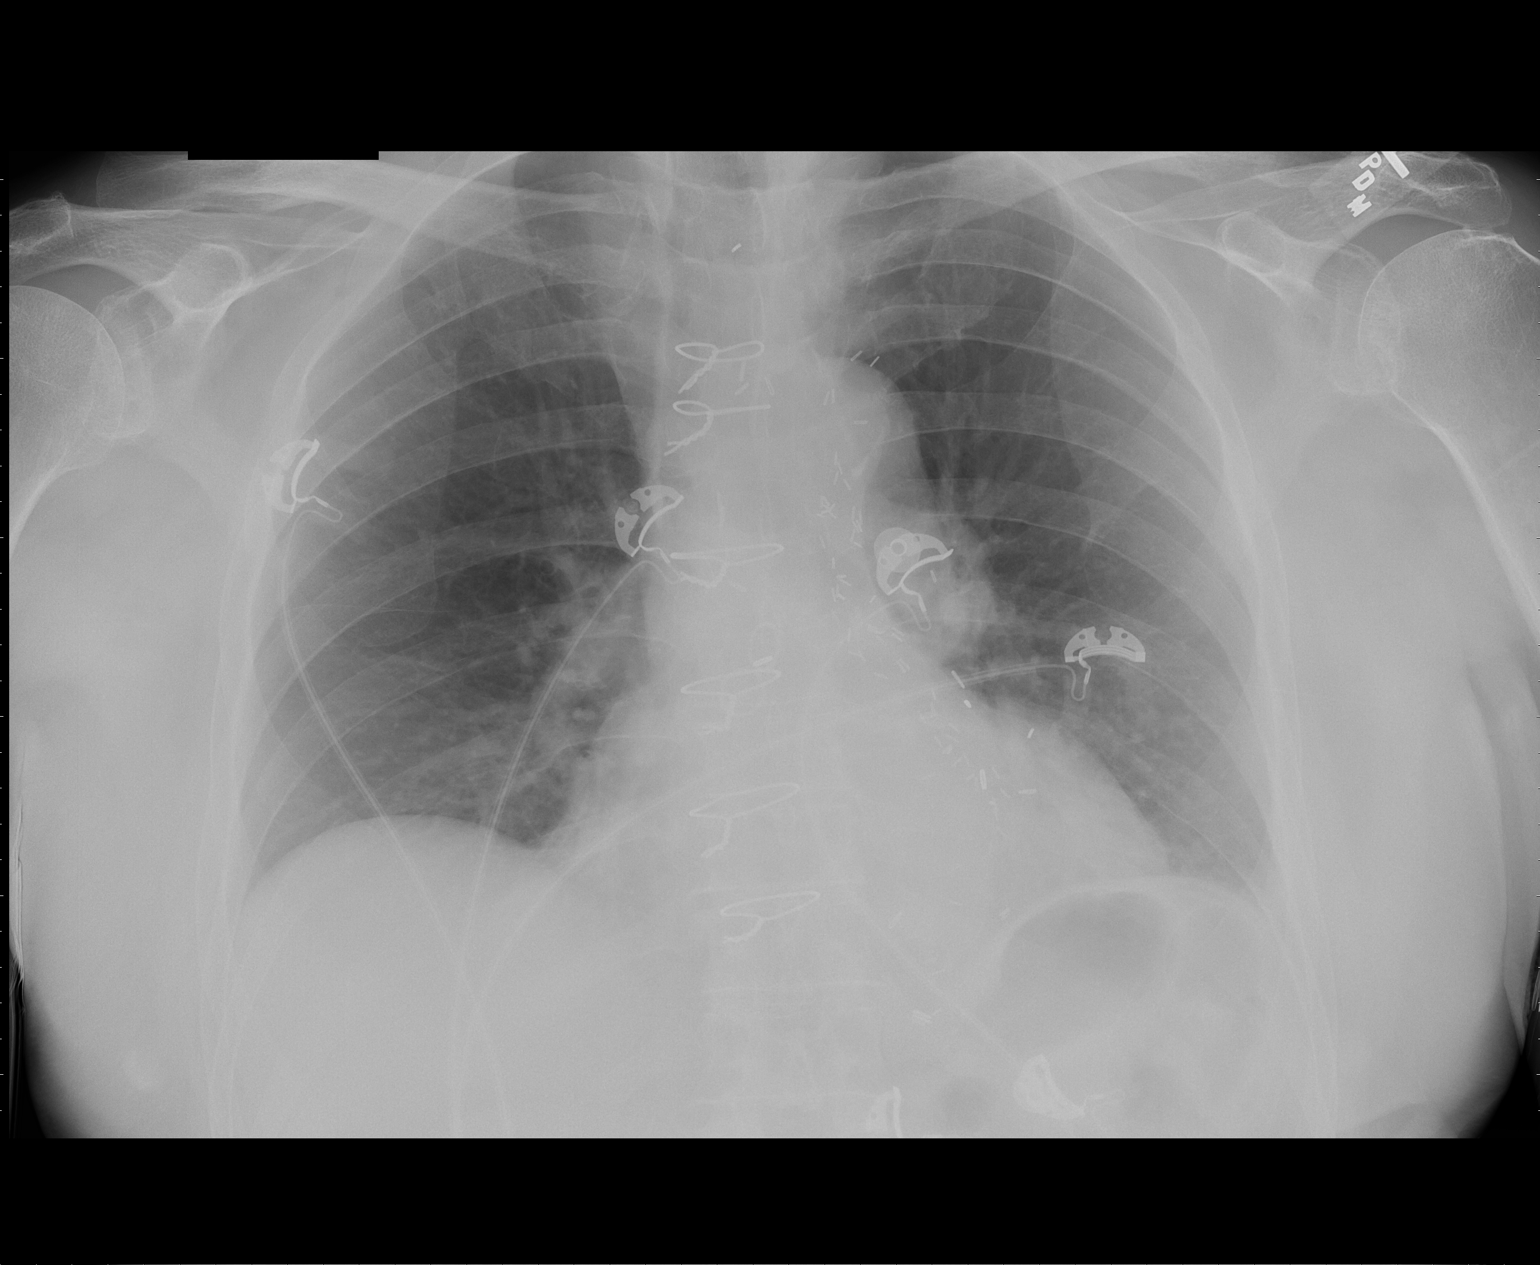

[1 of 1 positions shown; findings below may reference images not displayed]

FINDINGS: Trachea is midline.  Heart size is accentuated by AP semi
upright technique and low lung volumes.  Mild bibasilar
atelectasis.  No edema.  No pleural fluid.
IMPRESSION: Low lung volumes with mild bibasilar atelectasis.

## 2014-04-21 ENCOUNTER — Encounter (HOSPITAL_COMMUNITY): Payer: Medicare Other | Attending: Hematology and Oncology

## 2014-04-21 ENCOUNTER — Encounter (HOSPITAL_COMMUNITY): Payer: Self-pay

## 2014-04-21 ENCOUNTER — Encounter (HOSPITAL_COMMUNITY): Payer: Medicare Other

## 2014-04-21 VITALS — BP 131/39 | HR 60 | Temp 97.4°F | Resp 18 | Wt 183.0 lb

## 2014-04-21 DIAGNOSIS — G3183 Dementia with Lewy bodies: Secondary | ICD-10-CM

## 2014-04-21 DIAGNOSIS — E785 Hyperlipidemia, unspecified: Secondary | ICD-10-CM | POA: Insufficient documentation

## 2014-04-21 DIAGNOSIS — F0280 Dementia in other diseases classified elsewhere without behavioral disturbance: Secondary | ICD-10-CM | POA: Insufficient documentation

## 2014-04-21 DIAGNOSIS — K219 Gastro-esophageal reflux disease without esophagitis: Secondary | ICD-10-CM | POA: Diagnosis not present

## 2014-04-21 DIAGNOSIS — F0390 Unspecified dementia without behavioral disturbance: Secondary | ICD-10-CM

## 2014-04-21 DIAGNOSIS — D61818 Other pancytopenia: Secondary | ICD-10-CM | POA: Diagnosis not present

## 2014-04-21 DIAGNOSIS — I498 Other specified cardiac arrhythmias: Secondary | ICD-10-CM | POA: Diagnosis not present

## 2014-04-21 DIAGNOSIS — I7090 Unspecified atherosclerosis: Secondary | ICD-10-CM | POA: Diagnosis not present

## 2014-04-21 DIAGNOSIS — R7309 Other abnormal glucose: Secondary | ICD-10-CM | POA: Diagnosis not present

## 2014-04-21 DIAGNOSIS — N183 Chronic kidney disease, stage 3 unspecified: Secondary | ICD-10-CM | POA: Diagnosis not present

## 2014-04-21 DIAGNOSIS — Z809 Family history of malignant neoplasm, unspecified: Secondary | ICD-10-CM | POA: Diagnosis not present

## 2014-04-21 DIAGNOSIS — H409 Unspecified glaucoma: Secondary | ICD-10-CM

## 2014-04-21 DIAGNOSIS — I739 Peripheral vascular disease, unspecified: Secondary | ICD-10-CM | POA: Diagnosis not present

## 2014-04-21 DIAGNOSIS — R0602 Shortness of breath: Secondary | ICD-10-CM | POA: Diagnosis not present

## 2014-04-21 DIAGNOSIS — Z954 Presence of other heart-valve replacement: Secondary | ICD-10-CM | POA: Insufficient documentation

## 2014-04-21 DIAGNOSIS — I2510 Atherosclerotic heart disease of native coronary artery without angina pectoris: Secondary | ICD-10-CM | POA: Diagnosis not present

## 2014-04-21 DIAGNOSIS — Z87891 Personal history of nicotine dependence: Secondary | ICD-10-CM | POA: Insufficient documentation

## 2014-04-21 DIAGNOSIS — Z9889 Other specified postprocedural states: Secondary | ICD-10-CM | POA: Insufficient documentation

## 2014-04-21 DIAGNOSIS — I129 Hypertensive chronic kidney disease with stage 1 through stage 4 chronic kidney disease, or unspecified chronic kidney disease: Secondary | ICD-10-CM | POA: Diagnosis not present

## 2014-04-21 DIAGNOSIS — Z8673 Personal history of transient ischemic attack (TIA), and cerebral infarction without residual deficits: Secondary | ICD-10-CM | POA: Diagnosis not present

## 2014-04-21 DIAGNOSIS — I10 Essential (primary) hypertension: Secondary | ICD-10-CM

## 2014-04-21 DIAGNOSIS — Z79899 Other long term (current) drug therapy: Secondary | ICD-10-CM | POA: Diagnosis not present

## 2014-04-21 DIAGNOSIS — I4891 Unspecified atrial fibrillation: Secondary | ICD-10-CM | POA: Insufficient documentation

## 2014-04-21 DIAGNOSIS — I359 Nonrheumatic aortic valve disorder, unspecified: Secondary | ICD-10-CM | POA: Diagnosis not present

## 2014-04-21 DIAGNOSIS — Z951 Presence of aortocoronary bypass graft: Secondary | ICD-10-CM | POA: Diagnosis not present

## 2014-04-21 DIAGNOSIS — Z950 Presence of cardiac pacemaker: Secondary | ICD-10-CM | POA: Insufficient documentation

## 2014-04-21 DIAGNOSIS — I442 Atrioventricular block, complete: Secondary | ICD-10-CM | POA: Diagnosis not present

## 2014-04-21 DIAGNOSIS — G4700 Insomnia, unspecified: Secondary | ICD-10-CM | POA: Diagnosis not present

## 2014-04-21 DIAGNOSIS — C91Z0 Other lymphoid leukemia not having achieved remission: Secondary | ICD-10-CM

## 2014-04-21 DIAGNOSIS — Z888 Allergy status to other drugs, medicaments and biological substances status: Secondary | ICD-10-CM | POA: Insufficient documentation

## 2014-04-21 DIAGNOSIS — Z91013 Allergy to seafood: Secondary | ICD-10-CM | POA: Diagnosis not present

## 2014-04-21 LAB — CBC WITH DIFFERENTIAL/PLATELET
Basophils Absolute: 0 10*3/uL (ref 0.0–0.1)
Basophils Relative: 1 % (ref 0–1)
Eosinophils Absolute: 0.1 10*3/uL (ref 0.0–0.7)
Eosinophils Relative: 2 % (ref 0–5)
HCT: 29.8 % — ABNORMAL LOW (ref 39.0–52.0)
HEMOGLOBIN: 9.3 g/dL — AB (ref 13.0–17.0)
Lymphocytes Relative: 43 % (ref 12–46)
Lymphs Abs: 1.3 10*3/uL (ref 0.7–4.0)
MCH: 25.1 pg — ABNORMAL LOW (ref 26.0–34.0)
MCHC: 31.2 g/dL (ref 30.0–36.0)
MCV: 80.3 fL (ref 78.0–100.0)
MONOS PCT: 18 % — AB (ref 3–12)
Monocytes Absolute: 0.6 10*3/uL (ref 0.1–1.0)
NEUTROS PCT: 36 % — AB (ref 43–77)
Neutro Abs: 1.1 10*3/uL — ABNORMAL LOW (ref 1.7–7.7)
PLATELETS: 161 10*3/uL (ref 150–400)
RBC: 3.71 MIL/uL — ABNORMAL LOW (ref 4.22–5.81)
RDW: 15.2 % (ref 11.5–15.5)
WBC: 3.1 10*3/uL — ABNORMAL LOW (ref 4.0–10.5)

## 2014-04-21 LAB — COMPREHENSIVE METABOLIC PANEL
ALK PHOS: 92 U/L (ref 39–117)
ALT: 14 U/L (ref 0–53)
ANION GAP: 12 (ref 5–15)
AST: 27 U/L (ref 0–37)
Albumin: 3.2 g/dL — ABNORMAL LOW (ref 3.5–5.2)
BUN: 44 mg/dL — ABNORMAL HIGH (ref 6–23)
CO2: 25 mEq/L (ref 19–32)
CREATININE: 2.01 mg/dL — AB (ref 0.50–1.35)
Calcium: 9.1 mg/dL (ref 8.4–10.5)
Chloride: 101 mEq/L (ref 96–112)
GFR calc Af Amer: 32 mL/min — ABNORMAL LOW (ref >90)
GFR calc non Af Amer: 28 mL/min — ABNORMAL LOW (ref >90)
Glucose, Bld: 142 mg/dL — ABNORMAL HIGH (ref 70–99)
POTASSIUM: 4.4 meq/L (ref 3.7–5.3)
Sodium: 138 mEq/L (ref 137–147)
TOTAL PROTEIN: 6.8 g/dL (ref 6.0–8.3)
Total Bilirubin: 0.4 mg/dL (ref 0.3–1.2)

## 2014-04-21 LAB — LACTATE DEHYDROGENASE: LDH: 369 U/L — AB (ref 94–250)

## 2014-04-21 NOTE — Progress Notes (Signed)
[name]'s reason for visit today is for labs as scheduled per MD orders.  Venipuncture performed with a 23 gauge butterfly needle to R Antecubital.  [name] tolerated procedure well and without incident; questions were answered and patient was discharged.

## 2014-04-21 NOTE — Patient Instructions (Signed)
[facility] Discharge Instructions  RECOMMENDATIONS MADE BY THE CONSULTANT AND ANY TEST RESULTS WILL BE SENT TO YOUR REFERRING PHYSICIAN.  EXAM FINDINGS BY THE PHYSICIAN TODAY AND SIGNS OR SYMPTOMS TO REPORT TO CLINIC OR PRIMARY PHYSICIAN:    You saw Dr Barnet Glasgow today  Follow up in 6 months with the doctor and lab work  Thank you for choosing [facility] to provide your oncology and hematology care.  To afford each patient quality time with our providers, please arrive at least 15 minutes before your scheduled appointment time.  With your help, our goal is to use those 15 minutes to complete the necessary work-up to ensure our physicians have the information they need to help with your evaluation and healthcare recommendations.    Effective January 1st, 2014, we ask that you re-schedule your appointment with our physicians should you arrive 10 or more minutes late for your appointment.  We strive to give you quality time with our providers, and arriving late affects you and other patients whose appointments are after yours.    Again, thank you for choosing [facility].  Our hope is that these requests will decrease the amount of time that you wait before being seen by our physicians.       _____________________________________________________________  Should you have questions after your visit to [facility], please contact our office at [phone] between the hours of 8:30 a.m. and 5:00 p.m.  Voicemails left after 4:30 p.m. will not be returned until the following business day.  For prescription refill requests, have your pharmacy contact our office with your prescription refill request.

## 2014-04-21 NOTE — Progress Notes (Signed)
[facility]  OFFICE PROGRESS NOTE  Corey Bogus, MD [address] [place] [address]  DIAGNOSIS: Large granular lymphocyte disorder - Plan: CBC with Differential, Comprehensive metabolic panel, Lactate dehydrogenase, Beta 2 microglobulin, serum, CBC with Differential, Lactate dehydrogenase, Comprehensive metabolic panel  Glaucoma  Essential hypertension  Dementia, without behavioral disturbance  Chief Complaint  Patient presents with  . Large granular lymphocytic leukemia    CURRENT THERAPY: Watchful expectation.  INTERVAL HISTORY: [name] 80 y.o. 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 He continues to live at home and spends his days at daycare. He is meticulously cared for by his 2 daughters. Appetite is good with no nausea, vomiting, or urinary hesitancy or incontinence. Hearing is 50 with bilateral hearing prostheses. He denies any fever, night sweats, but does have chronic lower extremity swelling. He denies any chest pain, PND, orthopnea, abdominal distention, easy satiety, skin rash, joint pain, sore throat, headache, or seizures.    MEDICAL HISTORY: Past Medical History  Diagnosis Date  . Arteriosclerotic cardiovascular disease (ASCVD)     CABG & bioprosthetic aortic valve replace in 1999. PCI of right coronary in 2000, catheter in 07/2002 revealed total occlusion of the LAD; LIMA-LAD patent; patent SVG-OM1, SVG-diagonal ramus, PDA occluded; patent Cx stent, distal 30-40% stenosis Cx; nondom RCA, prox RCA 20-30% lesion, distal RCA 90% lesion; normal EF  . TIA (transient ischemic attack)   . Hypertension     normal CMet and 2011  . Hyperlipidemia     Lipid profile in 12/2009-170, 73, 79, 76  . PVD (peripheral vascular disease)   . Parkinson disease 2010    mild; + dementia  . History of sinus bradycardia     induced by beta  blocker  . History of syncope   . Chronic lymphatic leukemia     mild pancytopenia in 02/2010;hemoglobin-12.9, WBC-3.3, platelets-109  . Hepatitis   . Glaucoma 09/1994  . Tobacco abuse, in remission     20 pack years; discontinued 50 years ago  . Insomnia     Middle of the night awakening  . Coronary artery disease   . Dysrhythmia     Bradycardia  . Shortness of breath   . GERD (gastroesophageal reflux disease)     INTERIM HISTORY: has ATHEROSCLEROTIC CARDIOVASCULAR DISEASE; TIA; PERIPHERAL VASCULAR DISEASE; AORTIC VALVE REPLACEMENT, HX OF; Tobacco abuse, in remission; Parkinson disease; Hypertension; Hyperlipidemia; Chronic lymphatic leukemia; Insomnia; Carotid artery hypersensitivity; Bradycardia, severe sinus; Pacemaker-St.Jude; Fasting hyperglycemia; Chronic kidney disease, stage 3; Atrial fibrillation; and Aortic insufficiency with aortic stenosis on his problem list.   History of pancytopenia who had a bone marrow aspiration and biopsy 09/02/1992 which showed hypocellular marrow with relatively increased in lymphocyte population. Peripheral blood flow cytometric dated 07/04/1993 showed abnormal T cell phenotype, CD8 positive with partial expression of CD16 and CD56 suggestive of large granular cell lymphocytosis.  Subsequent T-cell receptor Gene rearrangement did not show clonal T-cell population however it was stated that the DNA recovered from the peripheral blood cells limited study and presence of clonal T-cell could not be definitely excluded  ALLERGIES:  is allergic to shellfish allergy and ambien.  MEDICATIONS: has a current medication list which includes the following prescription(s): apixaban, carbidopa-levodopa, chlorthalidone, combigan, crestor, donepezil, furosemide, ipratropium, and lisinopril-hydrochlorothiazide.  SURGICAL HISTORY:  Past Surgical History  Procedure Laterality Date  . Retinal detachment surgery  1989  . Tonsillectomy    .  Transurethral resection of  prostate      Benign prostatic hypertrophy  . Coronary artery bypass graft  1999    + Bioprosthetic AVR  . Colonoscopy  2011  . A-v cardiac pacemaker insertion  02/2012    St. Jude Accent DR RF    FAMILY HISTORY: family history includes Breast cancer in his sister; Cancer in his brother; Diabetes in his sister; Pneumonia in his father.  SOCIAL HISTORY:  reports that he has quit smoking. He has never used smokeless tobacco. He reports that he drinks alcohol. He reports that he does not use illicit drugs.  REVIEW OF SYSTEMS:  Other than that discussed above is noncontributory.  PHYSICAL EXAMINATION: ECOG PERFORMANCE STATUS: 1 - Symptomatic but completely ambulatory  Blood pressure 131/39, pulse 60, temperature 97.4 F (36.3 C), temperature source Oral, resp. rate 18, weight 183 lb (83.008 kg).  GENERAL:alert, no distress and comfortable SKIN: skin color, texture, turgor are normal, no rashes or significant lesions EYES: PERLA; Conjunctiva are pink and non-injected, sclera clear EARS: Bilateral hearing aids in place. SINUSES: No redness or tenderness over maxillary or ethmoid sinuses OROPHARYNX:no exudate, no erythema on lips, buccal mucosa, or tongue. NECK: supple, thyroid normal size, non-tender, without nodularity. No masses CHEST: Normal AP diameter with no breast masses. Midline sternotomy scar well healed. Pacemaker in place. LYMPH:  no palpable lymphadenopathy in the cervical, axillary or inguinal LUNGS: clear to auscultation and percussion with normal breathing effort HEART: regular rate & rhythm and no murmurs. Clinic of aortic valve prosthesis heard with no diastolic murmur. ABDOMEN:abdomen soft, non-tender and normal bowel sounds. Spleen not palpable. MUSCULOSKELETAL:no cyanosis of digits and no clubbing. Range of motion normal. +2 pitting edema of both lower extremities.  NEURO: alert & oriented x 3 with fluent speech, no focal motor/sensory deficits   LABORATORY  DATA: Appointment on 04/21/2014  Component Date Value Ref Range Status  . WBC 04/21/2014 3.1* 4.0 - 10.5 K/uL Final  . RBC 04/21/2014 3.71* 4.22 - 5.81 MIL/uL Final  . Hemoglobin 04/21/2014 9.3* 13.0 - 17.0 g/dL Final  . HCT 04/21/2014 29.8* 39.0 - 52.0 % Final  . MCV 04/21/2014 80.3  78.0 - 100.0 fL Final  . MCH 04/21/2014 25.1* 26.0 - 34.0 pg Final  . MCHC 04/21/2014 31.2  30.0 - 36.0 g/dL Final  . RDW 04/21/2014 15.2  11.5 - 15.5 % Final  . Platelets 04/21/2014 161  150 - 400 K/uL Final  . Neutrophils Relative % 04/21/2014 36* 43 - 77 % Final  . Neutro Abs 04/21/2014 1.1* 1.7 - 7.7 K/uL Final  . Lymphocytes Relative 04/21/2014 43  12 - 46 % Final  . Lymphs Abs 04/21/2014 1.3  0.7 - 4.0 K/uL Final  . Monocytes Relative 04/21/2014 18* 3 - 12 % Final  . Monocytes Absolute 04/21/2014 0.6  0.1 - 1.0 K/uL Final  . Eosinophils Relative 04/21/2014 2  0 - 5 % Final  . Eosinophils Absolute 04/21/2014 0.1  0.0 - 0.7 K/uL Final  . Basophils Relative 04/21/2014 1  0 - 1 % Final  . Basophils Absolute 04/21/2014 0.0  0.0 - 0.1 K/uL Final  . Sodium 04/21/2014 138  137 - 147 mEq/L Final  . Potassium 04/21/2014 4.4  3.7 - 5.3 mEq/L Final  . Chloride 04/21/2014 101  96 - 112 mEq/L Final  . CO2 04/21/2014 25  19 - 32 mEq/L Final  . Glucose, Bld 04/21/2014 142* 70 - 99 mg/dL Final  . BUN 04/21/2014 44* 6 - 23  mg/dL Final  . Creatinine, Ser 04/21/2014 2.01* 0.50 - 1.35 mg/dL Final  . Calcium 04/21/2014 9.1  8.4 - 10.5 mg/dL Final  . Total Protein 04/21/2014 6.8  6.0 - 8.3 g/dL Final  . Albumin 04/21/2014 3.2* 3.5 - 5.2 g/dL Final  . AST 04/21/2014 27  0 - 37 U/L Final  . ALT 04/21/2014 14  0 - 53 U/L Final  . Alkaline Phosphatase 04/21/2014 92  39 - 117 U/L Final  . Total Bilirubin 04/21/2014 0.4  0.3 - 1.2 mg/dL Final  . GFR calc non Af Amer 04/21/2014 28* >90 mL/min Final  . GFR calc Af Amer 04/21/2014 32* >90 mL/min Final   Comment: (NOTE)                          The eGFR has been calculated  using the CKD EPI equation.                          This calculation has not been validated in all clinical situations.                          eGFR's persistently <90 mL/min signify possible Chronic Kidney                          Disease.  . Anion gap 04/21/2014 12  5 - 15 Final  . LDH 04/21/2014 369* 94 - 250 U/L Final   SLIGHT HEMOLYSIS    PATHOLOGY: No new pathology.  Urinalysis    Component Value Date/Time   COLORURINE YELLOW 08/05/2012 1300   APPEARANCEUR CLEAR 08/05/2012 1300   LABSPEC 1.015 08/05/2012 1300   PHURINE 6.5 08/05/2012 1300   GLUCOSEU NEGATIVE 08/05/2012 1300   HGBUR NEGATIVE 08/05/2012 1300   BILIRUBINUR NEGATIVE 08/05/2012 1300   KETONESUR NEGATIVE 08/05/2012 1300   PROTEINUR NEGATIVE 08/05/2012 1300   UROBILINOGEN 0.2 08/05/2012 1300   NITRITE NEGATIVE 08/05/2012 1300   LEUKOCYTESUR NEGATIVE 08/05/2012 1300    RADIOGRAPHIC STUDIES: No results found.  ASSESSMENT:  #1. 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 Manifestation is as pancytopenia without signs or symptoms of significant infection, bleeding, or bruising.  #2. Complete heart block, status post pacemaker.  #3. Coronary artery disease, status post quadruple bypass.  #4. Dementia.      PLAN:  #1. Continue watchful expectation. Patient's daughter accompanied him today and she was told to watch for fever, worsening fatigue, easy bruising, or easy satiety. #2. Followup in 6 months with CBC, chem profile, and LDH.   All questions were answered. The patient knows to call the clinic with any problems, questions or concerns. We can certainly see the patient much sooner if necessary.   I spent 25 minutes counseling the patient face to face. The total time spent in the appointment was 30 minutes.    Doroteo Bradford, MD 04/21/2014 4:46 PM  DISCLAIMER:  This note was dictated with voice recognition software.  Similar sounding words can inadvertently  be transcribed inaccurately and may not be corrected upon review.

## 2014-04-23 LAB — BETA 2 MICROGLOBULIN, SERUM: Beta-2 Microglobulin: 5.5 mg/L — ABNORMAL HIGH (ref <=2.51)

## 2014-05-17 ENCOUNTER — Other Ambulatory Visit: Payer: Self-pay | Admitting: Internal Medicine

## 2014-05-18 ENCOUNTER — Emergency Department (HOSPITAL_COMMUNITY): Payer: Medicare Other

## 2014-05-18 ENCOUNTER — Inpatient Hospital Stay (HOSPITAL_COMMUNITY)
Admission: EM | Admit: 2014-05-18 | Discharge: 2014-05-24 | DRG: 683 | Disposition: A | Discharge status: Home Health | Payer: Medicare Other | Attending: Pulmonary Disease | Admitting: Pulmonary Disease

## 2014-05-18 ENCOUNTER — Encounter (HOSPITAL_COMMUNITY): Payer: Self-pay | Admitting: Emergency Medicine

## 2014-05-18 DIAGNOSIS — R5381 Other malaise: Secondary | ICD-10-CM | POA: Diagnosis not present

## 2014-05-18 DIAGNOSIS — N183 Chronic kidney disease, stage 3 unspecified: Secondary | ICD-10-CM | POA: Diagnosis present

## 2014-05-18 DIAGNOSIS — E861 Hypovolemia: Secondary | ICD-10-CM | POA: Diagnosis present

## 2014-05-18 DIAGNOSIS — R5383 Other fatigue: Secondary | ICD-10-CM

## 2014-05-18 DIAGNOSIS — Z952 Presence of prosthetic heart valve: Secondary | ICD-10-CM | POA: Diagnosis not present

## 2014-05-18 DIAGNOSIS — Z950 Presence of cardiac pacemaker: Secondary | ICD-10-CM

## 2014-05-18 DIAGNOSIS — N179 Acute kidney failure, unspecified: Secondary | ICD-10-CM | POA: Diagnosis present

## 2014-05-18 DIAGNOSIS — Z8673 Personal history of transient ischemic attack (TIA), and cerebral infarction without residual deficits: Secondary | ICD-10-CM | POA: Diagnosis not present

## 2014-05-18 DIAGNOSIS — Z833 Family history of diabetes mellitus: Secondary | ICD-10-CM

## 2014-05-18 DIAGNOSIS — K219 Gastro-esophageal reflux disease without esophagitis: Secondary | ICD-10-CM | POA: Diagnosis present

## 2014-05-18 DIAGNOSIS — Z87891 Personal history of nicotine dependence: Secondary | ICD-10-CM | POA: Diagnosis not present

## 2014-05-18 DIAGNOSIS — D638 Anemia in other chronic diseases classified elsewhere: Secondary | ICD-10-CM | POA: Diagnosis present

## 2014-05-18 DIAGNOSIS — C9110 Chronic lymphocytic leukemia of B-cell type not having achieved remission: Secondary | ICD-10-CM | POA: Diagnosis present

## 2014-05-18 DIAGNOSIS — I872 Venous insufficiency (chronic) (peripheral): Secondary | ICD-10-CM | POA: Diagnosis present

## 2014-05-18 DIAGNOSIS — Z951 Presence of aortocoronary bypass graft: Secondary | ICD-10-CM

## 2014-05-18 DIAGNOSIS — I129 Hypertensive chronic kidney disease with stage 1 through stage 4 chronic kidney disease, or unspecified chronic kidney disease: Secondary | ICD-10-CM | POA: Diagnosis present

## 2014-05-18 DIAGNOSIS — Z803 Family history of malignant neoplasm of breast: Secondary | ICD-10-CM | POA: Diagnosis not present

## 2014-05-18 DIAGNOSIS — E785 Hyperlipidemia, unspecified: Secondary | ICD-10-CM | POA: Diagnosis present

## 2014-05-18 DIAGNOSIS — I739 Peripheral vascular disease, unspecified: Secondary | ICD-10-CM | POA: Diagnosis present

## 2014-05-18 DIAGNOSIS — N184 Chronic kidney disease, stage 4 (severe): Secondary | ICD-10-CM | POA: Diagnosis present

## 2014-05-18 DIAGNOSIS — I509 Heart failure, unspecified: Secondary | ICD-10-CM | POA: Diagnosis present

## 2014-05-18 DIAGNOSIS — R531 Weakness: Secondary | ICD-10-CM | POA: Diagnosis present

## 2014-05-18 DIAGNOSIS — F0390 Unspecified dementia without behavioral disturbance: Secondary | ICD-10-CM | POA: Diagnosis present

## 2014-05-18 DIAGNOSIS — E860 Dehydration: Secondary | ICD-10-CM | POA: Diagnosis present

## 2014-05-18 DIAGNOSIS — I352 Nonrheumatic aortic (valve) stenosis with insufficiency: Secondary | ICD-10-CM | POA: Diagnosis present

## 2014-05-18 DIAGNOSIS — I2510 Atherosclerotic heart disease of native coronary artery without angina pectoris: Secondary | ICD-10-CM | POA: Diagnosis present

## 2014-05-18 DIAGNOSIS — I4891 Unspecified atrial fibrillation: Secondary | ICD-10-CM | POA: Diagnosis present

## 2014-05-18 DIAGNOSIS — G20A1 Parkinson's disease without dyskinesia, without mention of fluctuations: Secondary | ICD-10-CM | POA: Diagnosis present

## 2014-05-18 DIAGNOSIS — H409 Unspecified glaucoma: Secondary | ICD-10-CM | POA: Diagnosis present

## 2014-05-18 DIAGNOSIS — G20 Parkinson's disease: Secondary | ICD-10-CM | POA: Diagnosis present

## 2014-05-18 DIAGNOSIS — I10 Essential (primary) hypertension: Secondary | ICD-10-CM | POA: Diagnosis present

## 2014-05-18 HISTORY — DX: Unspecified dementia, unspecified severity, without behavioral disturbance, psychotic disturbance, mood disturbance, and anxiety: F03.90

## 2014-05-18 LAB — CBC WITH DIFFERENTIAL/PLATELET
BASOS ABS: 0 10*3/uL (ref 0.0–0.1)
Basophils Relative: 0 % (ref 0–1)
Eosinophils Absolute: 0 10*3/uL (ref 0.0–0.7)
Eosinophils Relative: 1 % (ref 0–5)
HEMATOCRIT: 30.9 % — AB (ref 39.0–52.0)
Hemoglobin: 9.6 g/dL — ABNORMAL LOW (ref 13.0–17.0)
LYMPHS PCT: 28 % (ref 12–46)
Lymphs Abs: 1.2 10*3/uL (ref 0.7–4.0)
MCH: 23.2 pg — ABNORMAL LOW (ref 26.0–34.0)
MCHC: 31.1 g/dL (ref 30.0–36.0)
MCV: 74.6 fL — ABNORMAL LOW (ref 78.0–100.0)
MONO ABS: 0.9 10*3/uL (ref 0.1–1.0)
MONOS PCT: 20 % — AB (ref 3–12)
NEUTROS PCT: 51 % (ref 43–77)
Neutro Abs: 2.2 10*3/uL (ref 1.7–7.7)
Platelets: 146 10*3/uL — ABNORMAL LOW (ref 150–400)
RBC: 4.14 MIL/uL — ABNORMAL LOW (ref 4.22–5.81)
RDW: 16.8 % — ABNORMAL HIGH (ref 11.5–15.5)
WBC: 4.3 10*3/uL (ref 4.0–10.5)

## 2014-05-18 LAB — COMPREHENSIVE METABOLIC PANEL
ALBUMIN: 3.4 g/dL — AB (ref 3.5–5.2)
ALK PHOS: 139 U/L — AB (ref 39–117)
ALT: <5 U/L (ref 0–53)
AST: 34 U/L (ref 0–37)
Anion gap: 15 (ref 5–15)
BILIRUBIN TOTAL: 0.9 mg/dL (ref 0.3–1.2)
BUN: 62 mg/dL — AB (ref 6–23)
CHLORIDE: 102 meq/L (ref 96–112)
CO2: 22 meq/L (ref 19–32)
CREATININE: 2.4 mg/dL — AB (ref 0.50–1.35)
Calcium: 9.1 mg/dL (ref 8.4–10.5)
GFR calc Af Amer: 26 mL/min — ABNORMAL LOW (ref >90)
GFR calc non Af Amer: 22 mL/min — ABNORMAL LOW (ref >90)
Glucose, Bld: 179 mg/dL — ABNORMAL HIGH (ref 70–99)
Potassium: 4.6 mEq/L (ref 3.7–5.3)
Sodium: 139 mEq/L (ref 137–147)
Total Protein: 6.8 g/dL (ref 6.0–8.3)

## 2014-05-18 LAB — PRO B NATRIURETIC PEPTIDE: Pro B Natriuretic peptide (BNP): 4996 pg/mL — ABNORMAL HIGH (ref 0–450)

## 2014-05-18 LAB — URINALYSIS, ROUTINE W REFLEX MICROSCOPIC
Bilirubin Urine: NEGATIVE
Glucose, UA: NEGATIVE mg/dL
Hgb urine dipstick: NEGATIVE
Ketones, ur: NEGATIVE mg/dL
LEUKOCYTES UA: NEGATIVE
Nitrite: NEGATIVE
PH: 5.5 (ref 5.0–8.0)
PROTEIN: NEGATIVE mg/dL
Specific Gravity, Urine: 1.01 (ref 1.005–1.030)
Urobilinogen, UA: 0.2 mg/dL (ref 0.0–1.0)

## 2014-05-18 LAB — TROPONIN I: Troponin I: <0.3 ng/mL (ref <0.30)

## 2014-05-18 LAB — LIPASE, BLOOD: Lipase: 71 U/L — ABNORMAL HIGH (ref 11–59)

## 2014-05-18 MED ORDER — LISINOPRIL-HYDROCHLOROTHIAZIDE 20-12.5 MG PO TABS: 1.0000 | ORAL_TABLET | Freq: Every day | ORAL | Status: DC

## 2014-05-18 MED ORDER — ONDANSETRON HCL 4 MG/2ML IJ SOLN: 4.0000 mg | Freq: Four times a day (QID) | INTRAMUSCULAR | Status: DC | PRN

## 2014-05-18 MED ORDER — SODIUM CHLORIDE 0.9 % IV SOLN
INTRAVENOUS | Status: DC
  Administered 2014-05-18 – 2014-05-20 (×4): via INTRAVENOUS

## 2014-05-18 MED ORDER — CARBIDOPA-LEVODOPA 25-100 MG PO TABS
1.0000 | ORAL_TABLET | Freq: Two times a day (BID) | ORAL | Status: DC
  Administered 2014-05-18 – 2014-05-24 (×12): 1 via ORAL
  Filled 2014-05-18 (×13): qty 1

## 2014-05-18 MED ORDER — IPRATROPIUM BROMIDE 0.03 % NA SOLN
1.0000 | Freq: Four times a day (QID) | NASAL | Status: DC
  Administered 2014-05-18 – 2014-05-22 (×9): 1 via NASAL
  Filled 2014-05-18: qty 30

## 2014-05-18 MED ORDER — DONEPEZIL HCL 5 MG PO TABS
10.0000 mg | ORAL_TABLET | Freq: Every day | ORAL | Status: DC
  Administered 2014-05-18 – 2014-05-23 (×6): 10 mg via ORAL
  Filled 2014-05-18 (×6): qty 2

## 2014-05-18 MED ORDER — APIXABAN 2.5 MG PO TABS
2.5000 mg | ORAL_TABLET | Freq: Two times a day (BID) | ORAL | Status: DC
  Administered 2014-05-18 – 2014-05-24 (×12): 2.5 mg via ORAL
  Filled 2014-05-18 (×16): qty 1

## 2014-05-18 MED ORDER — ENOXAPARIN SODIUM 30 MG/0.3ML ~~LOC~~ SOLN: 30.0000 mg | SUBCUTANEOUS | Status: DC

## 2014-05-18 MED ORDER — BRIMONIDINE TARTRATE-TIMOLOL 0.2-0.5 % OP SOLN: 1.0000 [drp] | Freq: Every day | OPHTHALMIC | Status: DC

## 2014-05-18 MED ORDER — FUROSEMIDE 40 MG PO TABS
40.0000 mg | ORAL_TABLET | Freq: Every day | ORAL | Status: DC
  Administered 2014-05-18 – 2014-05-19 (×2): 40 mg via ORAL
  Filled 2014-05-18 (×2): qty 1

## 2014-05-18 MED ORDER — IPRATROPIUM BROMIDE 0.06 % NA SOLN
1.0000 | Freq: Four times a day (QID) | NASAL | Status: DC
  Filled 2014-05-18: qty 15

## 2014-05-18 MED ORDER — HYDROCHLOROTHIAZIDE 12.5 MG PO CAPS
12.5000 mg | ORAL_CAPSULE | Freq: Every day | ORAL | Status: DC
  Administered 2014-05-18 – 2014-05-19 (×2): 12.5 mg via ORAL
  Filled 2014-05-18 (×2): qty 1

## 2014-05-18 MED ORDER — LISINOPRIL 10 MG PO TABS
20.0000 mg | ORAL_TABLET | Freq: Every day | ORAL | Status: DC
  Administered 2014-05-18 – 2014-05-19 (×2): 20 mg via ORAL
  Filled 2014-05-18 (×2): qty 2

## 2014-05-18 MED ORDER — ONDANSETRON HCL 4 MG PO TABS: 4.0000 mg | ORAL_TABLET | Freq: Four times a day (QID) | ORAL | Status: DC | PRN

## 2014-05-18 MED ORDER — ATORVASTATIN CALCIUM 40 MG PO TABS
40.0000 mg | ORAL_TABLET | Freq: Every day | ORAL | Status: DC
  Administered 2014-05-18 – 2014-05-23 (×5): 40 mg via ORAL
  Filled 2014-05-18 (×5): qty 1

## 2014-05-18 MED ORDER — BRIMONIDINE TARTRATE 0.15 % OP SOLN
1.0000 [drp] | Freq: Every day | OPHTHALMIC | Status: DC
  Administered 2014-05-18 – 2014-05-24 (×7): 1 [drp] via OPHTHALMIC
  Filled 2014-05-18: qty 5

## 2014-05-18 MED ORDER — TIMOLOL MALEATE 0.5 % OP SOLN
1.0000 [drp] | Freq: Every day | OPHTHALMIC | Status: DC
  Administered 2014-05-18 – 2014-05-24 (×7): 1 [drp] via OPHTHALMIC
  Filled 2014-05-18: qty 5

## 2014-05-18 NOTE — ED Provider Notes (Signed)
CSN: [id number]     Arrival date & time 05/18/14  1044 History  This chart was scribed for Maudry Diego, MD by Marti Sleigh, ED Scribe. This patient was seen in room APA03/APA03 and the patient's care was started at 11:17 AM.     Chief Complaint  Patient presents with  . Abdominal Pain  . Weakness    Patient is a 80 y.o. male presenting with abdominal pain and weakness. The history is provided by the patient and a relative. No language interpreter was used.  Abdominal Pain Pain location:  LUQ Pain severity:  Mild Onset quality:  Sudden Duration:  2 days Timing:  Constant Chronicity:  New Weakness This is a new problem. The current episode started 2 days ago. The problem occurs constantly. The problem has not changed since onset.Associated symptoms include abdominal pain.   HPI Comments: [name] is a 80 y.o. male with a history of pacemaker placement who presents to the Emergency Department complaining of constant weakness and difficulty walking that started two days ago. Daughter states that pt goes to adult day care, and told someone that he had left sided abdominal pain. He denies being in any pain currently.   Past Medical History  Diagnosis Date  . Arteriosclerotic cardiovascular disease (ASCVD)     CABG & bioprosthetic aortic valve replace in 1999. PCI of right coronary in 2000, catheter in 07/2002 revealed total occlusion of the LAD; LIMA-LAD patent; patent SVG-OM1, SVG-diagonal ramus, PDA occluded; patent Cx stent, distal 30-40% stenosis Cx; nondom RCA, prox RCA 20-30% lesion, distal RCA 90% lesion; normal EF  . TIA (transient ischemic attack)   . Hypertension     normal CMet and 2011  . Hyperlipidemia     Lipid profile in 12/2009-170, 73, 79, 76  . PVD (peripheral vascular disease)   . Parkinson disease 2010    mild; + dementia  . History of sinus bradycardia     induced by beta blocker  . History of syncope   . Chronic lymphatic leukemia     mild  pancytopenia in 02/2010;hemoglobin-12.9, WBC-3.3, platelets-109  . Hepatitis   . Glaucoma 09/1994  . Tobacco abuse, in remission     20 pack years; discontinued 50 years ago  . Insomnia     Middle of the night awakening  . Coronary artery disease   . Dysrhythmia     Bradycardia  . Shortness of breath   . GERD (gastroesophageal reflux disease)   . Dementia    Past Surgical History  Procedure Laterality Date  . Retinal detachment surgery  1989  . Tonsillectomy    . Transurethral resection of prostate      Benign prostatic hypertrophy  . Coronary artery bypass graft  1999    + Bioprosthetic AVR  . Colonoscopy  2011  . A-v cardiac pacemaker insertion  02/2012    [employer] Jude Accent DR RF   Family History  Problem Relation Age of Onset  . Pneumonia Father   . Cancer Brother   . Breast cancer Sister   . Diabetes Sister     complications   History  Substance Use Topics  . Smoking status: Former Research scientist (life sciences)  . Smokeless tobacco: Never Used  . Alcohol Use: No     Comment: rare    Review of Systems  Gastrointestinal: Positive for abdominal pain.  Musculoskeletal: Positive for gait problem.  Neurological: Positive for weakness.  All other systems reviewed and are negative.     Allergies  Shellfish allergy and Ambien  Home Medications   Prior to Admission medications   Medication Sig Start Date End Date Taking? Authorizing Provider  apixaban (ELIQUIS) 2.5 MG TABS tablet Take 1 tablet (2.5 mg total) by mouth 2 (two) times daily. 10/28/13   Evans Lance, MD  carbidopa-levodopa (SINEMET) 25-100 MG per tablet Take 1 tablet by mouth 2 (two) times daily.  02/08/11   Historical Provider, MD  chlorthalidone (HYGROTON) 25 MG tablet TAKE 1/2 TABLET BY MOUTH EVERY DAY 05/17/14   Evans Lance, MD  COMBIGAN 0.2-0.5 % ophthalmic solution Place 1 drop into both eyes daily.  08/09/11   Historical Provider, MD  CRESTOR 20 MG tablet TAKE 1 TABLET BY MOUTH EVERY DAY 02/12/14   Evans Lance, MD   donepezil (ARICEPT) 10 MG tablet Take 10 mg by mouth at bedtime.  02/23/11   Historical Provider, MD  furosemide (LASIX) 40 MG tablet Take 40 mg on Tuesdays, Thursdays, & Saturdays 01/15/13   Historical Provider, MD  ipratropium (ATROVENT) 0.06 % nasal spray Place 1 spray into both nostrils as needed.  12/11/12   Historical Provider, MD  lisinopril-hydrochlorothiazide (PRINZIDE,ZESTORETIC) 20-12.5 MG per tablet Take 1 tablet by mouth daily.    Historical Provider, MD   BP 122/41  Pulse 73  Temp(Src) 97.6 F (36.4 C) (Oral)  Resp 18  Ht 5\' 8"  (1.727 m)  Wt 183 lb (83.008 kg)  BMI 27.83 kg/m2  SpO2 100% Physical Exam  Nursing note and vitals reviewed. Constitutional: He is oriented to person, place, and time. He appears well-developed and well-nourished.  HENT:  Head: Normocephalic and atraumatic.  Eyes: Conjunctivae and EOM are normal. No scleral icterus.  Neck: Neck supple. No thyromegaly present.  Cardiovascular: Normal rate and regular rhythm.  Exam reveals no gallop and no friction rub.   No murmur heard. Pulmonary/Chest: No stridor. He has no wheezes. He has no rales. He exhibits no tenderness.  Abdominal: He exhibits no distension. There is tenderness. There is no rebound.  Minimal LUQ tenderness.   Musculoskeletal: Normal range of motion. He exhibits edema.  2+ edema bilateral ankles.   Lymphadenopathy:    He has no cervical adenopathy.  Neurological: He is oriented to person, place, and time. He exhibits normal muscle tone. Coordination normal.  Skin: No rash noted. No erythema.  Psychiatric: He has a normal mood and affect. His behavior is normal.    ED Course  Procedures (including critical care time)  DIAGNOSTIC STUDIES: Oxygen Saturation is 100% on room air, normal by my interpretation.    COORDINATION OF CARE: 11:20 AM Discussed treatment plan with pt at bedside and pt agreed to plan.  Labs Review Labs Reviewed - No data to display  Imaging Review No results  found.   EKG Interpretation   Date/Time:  Tuesday May 18 2014 11:16:05 EDT Ventricular Rate:  77 PR Interval:  201 QRS Duration: 115 QT Interval:  401 QTC Calculation: 169 R Axis:   111 Text Interpretation:  Sinus rhythm Nonspecific intraventricular conduction  delay Anteroseptal infarct, old Repol abnrm suggests ischemia,  anterolateral Confirmed by Jobani Sabado  MD, Yaron Grasse [phone]) on 05/18/2014 3:40:35  PM      MDM   Final diagnoses:  None         Maudry Diego, MD 05/18/14 1542

## 2014-05-18 NOTE — ED Notes (Signed)
Attempted to give report floor nurse ask to call back,

## 2014-05-18 NOTE — H&P (Signed)
[facility] History and Physical  [name] HCW:[id number] DOB: April 07, 1921 DOA: 05/18/2014  Referring physician: ER. PCP: [name], MD   Chief Complaint: Weakness.  HPI: [name] is a 80 y.o. male  This is a 80 year old man, who has mild to moderate dementia and Parkinson disease, now presents because he has been feeling weak for the last couple of days. He also appears to have some left upper quadrant abdominal pain without any vomiting or nausea. There is no diarrhea. He has a history of chronic large granular lymphocytic disorder. This is being followed with watchful expectation by the oncology team.   Review of Systems:  Constitutional:  No weight loss, night sweats, Fevers, chills, fatigue.  HEENT:  No headaches, Difficulty swallowing,Tooth/dental problems,Sore throat,  No sneezing, itching, ear ache, nasal congestion, post nasal drip,  Cardio-vascular:  No chest pain, Orthopnea, PND, swelling in lower extremities, anasarca, dizziness, palpitations  GI:  No heartburn, indigestion, abdominal pain, nausea, vomiting, diarrhea, change in bowel habits, loss of appetite  Resp:  No shortness of breath with exertion or at rest. No excess mucus, no productive cough, No non-productive cough, No coughing up of blood.No change in color of mucus.No wheezing.No chest wall deformity  Skin:  no rash or lesions.  GU:  no dysuria, change in color of urine, no urgency or frequency. No flank pain.  Musculoskeletal:  No joint pain or swelling. No decreased range of motion. No back pain.  Psych:  No change in mood or affect. No depression or anxiety. No memory loss.   Past Medical History  Diagnosis Date  . Arteriosclerotic cardiovascular disease (ASCVD)     CABG & bioprosthetic aortic valve replace in 1999. PCI of right coronary in 2000, catheter in 07/2002 revealed total occlusion of the LAD; LIMA-LAD patent; patent SVG-OM1, SVG-diagonal ramus, PDA occluded;  patent Cx stent, distal 30-40% stenosis Cx; nondom RCA, prox RCA 20-30% lesion, distal RCA 90% lesion; normal EF  . TIA (transient ischemic attack)   . Hypertension     normal CMet and 2011  . Hyperlipidemia     Lipid profile in 12/2009-170, 73, 79, 76  . PVD (peripheral vascular disease)   . Parkinson disease 2010    mild; + dementia  . History of sinus bradycardia     induced by beta blocker  . History of syncope   . Chronic lymphatic leukemia     mild pancytopenia in 02/2010;hemoglobin-12.9, WBC-3.3, platelets-109  . Hepatitis   . Glaucoma 09/1994  . Tobacco abuse, in remission     20 pack years; discontinued 50 years ago  . Insomnia     Middle of the night awakening  . Coronary artery disease   . Dysrhythmia     Bradycardia  . Shortness of breath   . GERD (gastroesophageal reflux disease)   . Dementia    Past Surgical History  Procedure Laterality Date  . Retinal detachment surgery  1989  . Tonsillectomy    . Transurethral resection of prostate      Benign prostatic hypertrophy  . Coronary artery bypass graft  1999    + Bioprosthetic AVR  . Colonoscopy  2011  . A-v cardiac pacemaker insertion  02/2012    St. Jude Accent DR RF   Social History:  reports that he has quit smoking. He has never used smokeless tobacco. He reports that he does not drink alcohol or use illicit drugs.  Allergies  Allergen Reactions  . Shellfish Allergy Anaphylaxis  .  Ambien [Zolpidem Tartrate]     Family History  Problem Relation Age of Onset  . Pneumonia Father   . Cancer Brother   . Breast cancer Sister   . Diabetes Sister     complications     Prior to Admission medications   Medication Sig Start Date End Date Taking? Authorizing Provider  apixaban (ELIQUIS) 2.5 MG TABS tablet Take 1 tablet (2.5 mg total) by mouth 2 (two) times daily. 10/28/13  Yes Evans Lance, MD  atorvastatin (LIPITOR) 40 MG tablet Take 1 tablet by mouth daily. 03/18/14  Yes Historical Provider, MD    carbidopa-levodopa (SINEMET) 25-100 MG per tablet Take 1 tablet by mouth 2 (two) times daily.  02/08/11  Yes Historical Provider, MD  chlorthalidone (HYGROTON) 25 MG tablet TAKE 1/2 TABLET BY MOUTH EVERY DAY 05/17/14  Yes Evans Lance, MD  COMBIGAN 0.2-0.5 % ophthalmic solution Place 1 drop into both eyes daily.  08/09/11  Yes Historical Provider, MD  donepezil (ARICEPT) 10 MG tablet Take 10 mg by mouth at bedtime.  02/23/11  Yes Historical Provider, MD  furosemide (LASIX) 40 MG tablet Take 40 mg on Tuesdays, Thursdays, & Saturdays 01/15/13  Yes Historical Provider, MD  ipratropium (ATROVENT) 0.06 % nasal spray Place 1 spray into both nostrils 4 (four) times daily.  12/11/12  Yes Historical Provider, MD  lisinopril-hydrochlorothiazide (PRINZIDE,ZESTORETIC) 20-12.5 MG per tablet Take 1 tablet by mouth daily.   Yes Historical Provider, MD   Physical Exam: Filed Vitals:   05/18/14 1400 05/18/14 1500 05/18/14 1528 05/18/14 1600  BP: 132/46 145/70  141/51  Pulse:      Temp:      TempSrc:      Resp: 12 25  27   Height:      Weight:      SpO2:   100%     Wt Readings from Last 3 Encounters:  05/18/14 83.008 kg (183 lb)  04/21/14 83.008 kg (183 lb)  03/02/14 79.833 kg (176 lb)    General:  Appears calm and comfortable. He looks clinically dehydrated. He looks somewhat pale also. Eyes: PERRL, normal lids, irises & conjunctiva ENT: grossly normal hearing, lips & tongue Neck: no LAD, masses or thyromegaly Cardiovascular: RRR, no m/r/g. Pitting lower leg edema. Telemetry: SR, no arrhythmias  Respiratory: CTA bilaterally, no w/r/r. Normal respiratory effort. Abdomen: soft, ntnd Skin: no rash or induration seen on limited exam Musculoskeletal: grossly normal tone BUE/BLE Psychiatric: grossly normal mood and affect, speech fluent and appropriate Neurologic: grossly non-focal.          Labs on Admission:  Basic Metabolic Panel:  Recent Labs Lab 05/18/14 1149  NA 139  K 4.6  CL 102   CO2 22  GLUCOSE 179*  BUN 62*  CREATININE 2.40*  CALCIUM 9.1   Liver Function Tests:  Recent Labs Lab 05/18/14 1149  AST 34  ALT <5  ALKPHOS 139*  BILITOT 0.9  PROT 6.8  ALBUMIN 3.4*    Recent Labs Lab 05/18/14 1149  LIPASE 71*   No results found for this basename: AMMONIA,  in the last 168 hours CBC:  Recent Labs Lab 05/18/14 1149  WBC 4.3  NEUTROABS 2.2  HGB 9.6*  HCT 30.9*  MCV 74.6*  PLT 146*   Cardiac Enzymes:  Recent Labs Lab 05/18/14 1149  TROPONINI <0.30    BNP (last 3 results)  Recent Labs  05/18/14 1149  PROBNP 4996.0*   CBG: No results found for this basename: GLUCAP,  in the  last 168 hours  Radiological Exams on Admission: Ct Abdomen Pelvis Wo Contrast  05/18/2014   CLINICAL DATA:  Abdominal pain.  EXAM: CT ABDOMEN AND PELVIS WITHOUT CONTRAST  TECHNIQUE: Multidetector CT imaging of the abdomen and pelvis was performed following the standard protocol without IV contrast.  COMPARISON:  August 05, 2012.  FINDINGS: Multilevel degenerative disc disease is noted in the lumbar spine. Mild bilateral pleural effusions are noted with right greater than left.  No gallstones are noted. Stable hepatic cysts are the spleen and pancreas appear normal. Adrenal glands appear normal. No hydronephrosis or renal obstruction is noted. No renal or ureteral calculi are noted. Atherosclerotic calcifications of abdominal aorta are noted without aneurysm formation. The appendix appears normal. There is no evidence of bowel obstruction. Urinary bladder appears normal. Mild bilateral fat containing inguinal hernias are noted. No abnormal fluid collection is noted. Diverticulosis of descending and sigmoid colon is noted without inflammation. No significant adenopathy is noted. Mild inflammatory changes are seen in the soft tissues posterior to the rectum suggesting possible inflammation. Mild wall thickening of the rectum is noted. Stool is noted in the rectum.   IMPRESSION: Diverticulosis of descending and sigmoid colon is noted without inflammation.  Mild bilateral pleural effusions are noted.  Mild wall thickening of the rectum is noted with surrounding inflammatory changes suggesting inflammation of this area.   Electronically Signed   By: [name]   On: 05/18/2014 13:50   Dg Chest Portable 1 View  05/18/2014   CLINICAL DATA:  Abdominal pain and weakness.  EXAM: PORTABLE CHEST - 1 VIEW  COMPARISON:  08/05/2012  FINDINGS: New cardiomegaly. Pulmonary vascularity is at the upper limits of normal. Dual lead pacemaker in place. There is loss of the silhouette pole of the left hemidiaphragm which is probably due to left base atelectasis. Right lung is clear. CABG. No acute osseous abnormality.  IMPRESSION: New cardiomegaly.  Slight left base atelectasis.   Electronically Signed   By: Rozetta Nunnery M.D.   On: 05/18/2014 11:59      Assessment/Plan   1. Weakness, probably multifactorial. 2. Acute on chronic kidney disease with elevation of BUN and creatinine relative to his baseline. This is likely due to dehydration. 3. Parkinson's disease with mild to moderate dementia. 4. Hypertension, stable.  Plan: 1. Admit. 2. Gentle IV fluids. 3. Monitor renal function closely.  Further recommendations will depend on patient's hospital progress.   Code Status: Full code for the time being.  Family Communication: I discussed the plan with the patient's daughter at the bedside  Disposition Plan: Home when medically stable.   Time spent: 60 minutes.  Doree Albee [facility] Pager [phone].

## 2014-05-18 NOTE — ED Notes (Signed)
Patient complaining of upper abdominal pain and weakness x 2 days. Family member states patient was unable to walk this morning.

## 2014-05-19 LAB — CBC
HCT: 28.8 % — ABNORMAL LOW (ref 39.0–52.0)
HEMOGLOBIN: 8.9 g/dL — AB (ref 13.0–17.0)
MCH: 22.8 pg — ABNORMAL LOW (ref 26.0–34.0)
MCHC: 30.9 g/dL (ref 30.0–36.0)
MCV: 73.8 fL — ABNORMAL LOW (ref 78.0–100.0)
Platelets: 140 10*3/uL — ABNORMAL LOW (ref 150–400)
RBC: 3.9 MIL/uL — AB (ref 4.22–5.81)
RDW: 16.8 % — ABNORMAL HIGH (ref 11.5–15.5)
WBC: 3.9 10*3/uL — AB (ref 4.0–10.5)

## 2014-05-19 LAB — COMPREHENSIVE METABOLIC PANEL
ALT: 11 U/L (ref 0–53)
ANION GAP: 15 (ref 5–15)
AST: 60 U/L — ABNORMAL HIGH (ref 0–37)
Albumin: 3 g/dL — ABNORMAL LOW (ref 3.5–5.2)
Alkaline Phosphatase: 158 U/L — ABNORMAL HIGH (ref 39–117)
BUN: 63 mg/dL — AB (ref 6–23)
CALCIUM: 8.6 mg/dL (ref 8.4–10.5)
CO2: 23 mEq/L (ref 19–32)
CREATININE: 2.5 mg/dL — AB (ref 0.50–1.35)
Chloride: 102 mEq/L (ref 96–112)
GFR calc non Af Amer: 21 mL/min — ABNORMAL LOW (ref >90)
GFR, EST AFRICAN AMERICAN: 25 mL/min — AB (ref >90)
Glucose, Bld: 163 mg/dL — ABNORMAL HIGH (ref 70–99)
Potassium: 4.4 mEq/L (ref 3.7–5.3)
Sodium: 140 mEq/L (ref 137–147)
TOTAL PROTEIN: 5.9 g/dL — AB (ref 6.0–8.3)
Total Bilirubin: 0.9 mg/dL (ref 0.3–1.2)

## 2014-05-19 MED ORDER — FUROSEMIDE 10 MG/ML IJ SOLN: 40.0000 mg | Freq: Every day | INTRAMUSCULAR | Status: DC

## 2014-05-19 MED ORDER — INFLUENZA VAC SPLIT QUAD 0.5 ML IM SUSY
0.5000 mL | PREFILLED_SYRINGE | INTRAMUSCULAR | Status: AC
  Administered 2014-05-20: 0.5 mL via INTRAMUSCULAR
  Filled 2014-05-19: qty 0.5

## 2014-05-19 NOTE — Progress Notes (Signed)
Subjective: Patient was due to generalized weakness. His labs shows worsening of his renal failure. Patient feels better today.  Objective: Vital signs in last 24 hours: Temp:  [97.6 F (36.4 C)-98.3 F (36.8 C)] 98.2 F (36.8 C) (09/23 0452) Pulse Rate:  [72-77] 77 (09/23 0452) Resp:  [12-27] 20 (09/23 0452) BP: (122-145)/(36-70) 142/36 mmHg (09/23 0452) SpO2:  [90 %-100 %] 100 % (09/23 0452) Weight:  [83.008 kg (183 lb)-85.004 kg (187 lb 6.4 oz)] 85.004 kg (187 lb 6.4 oz) (09/23 0452) Weight change:  Last BM Date: 05/18/14  Intake/Output from previous day: 09/22 0701 - 09/23 0700 In: 1123.8 [P.O.:240; I.V.:883.8] Out: 201 [Urine:200; Stool:1]  PHYSICAL EXAM General appearance: fatigued and slowed mentation Resp: diminished breath sounds bilaterally and rhonchi bilaterally Cardio: S1, S2 normal GI: soft, non-tender; bowel sounds normal; no masses,  no organomegaly Extremities: edema 2++ leg edema  Lab Results:  Results for orders placed during the hospital encounter of 05/18/14 (from the past 48 hour(s))  CBC WITH DIFFERENTIAL     Status: Abnormal   Collection Time    05/18/14 11:49 AM      Result Value Ref Range   WBC 4.3  4.0 - 10.5 K/uL   RBC 4.14 (*) 4.22 - 5.81 MIL/uL   Hemoglobin 9.6 (*) 13.0 - 17.0 g/dL   HCT 30.9 (*) 39.0 - 52.0 %   MCV 74.6 (*) 78.0 - 100.0 fL   MCH 23.2 (*) 26.0 - 34.0 pg   MCHC 31.1  30.0 - 36.0 g/dL   RDW 16.8 (*) 11.5 - 15.5 %   Platelets 146 (*) 150 - 400 K/uL   Neutrophils Relative % 51  43 - 77 %   Neutro Abs 2.2  1.7 - 7.7 K/uL   Lymphocytes Relative 28  12 - 46 %   Lymphs Abs 1.2  0.7 - 4.0 K/uL   Monocytes Relative 20 (*) 3 - 12 %   Monocytes Absolute 0.9  0.1 - 1.0 K/uL   Eosinophils Relative 1  0 - 5 %   Eosinophils Absolute 0.0  0.0 - 0.7 K/uL   Basophils Relative 0  0 - 1 %   Basophils Absolute 0.0  0.0 - 0.1 K/uL  COMPREHENSIVE METABOLIC PANEL     Status: Abnormal   Collection Time    05/18/14 11:49 AM      Result  Value Ref Range   Sodium 139  137 - 147 mEq/L   Potassium 4.6  3.7 - 5.3 mEq/L   Chloride 102  96 - 112 mEq/L   CO2 22  19 - 32 mEq/L   Glucose, Bld 179 (*) 70 - 99 mg/dL   BUN 62 (*) 6 - 23 mg/dL   Creatinine, Ser 2.40 (*) 0.50 - 1.35 mg/dL   Calcium 9.1  8.4 - 10.5 mg/dL   Total Protein 6.8  6.0 - 8.3 g/dL   Albumin 3.4 (*) 3.5 - 5.2 g/dL   AST 34  0 - 37 U/L   ALT <5  0 - 53 U/L   Alkaline Phosphatase 139 (*) 39 - 117 U/L   Total Bilirubin 0.9  0.3 - 1.2 mg/dL   GFR calc non Af Amer 22 (*) >90 mL/min   GFR calc Af Amer 26 (*) >90 mL/min   Comment: (NOTE)     The eGFR has been calculated using the CKD EPI equation.     This calculation has not been validated in all clinical situations.     eGFR's  persistently <90 mL/min signify possible Chronic Kidney     Disease.   Anion gap 15  5 - 15  LIPASE, BLOOD     Status: Abnormal   Collection Time    05/18/14 11:49 AM      Result Value Ref Range   Lipase 71 (*) 11 - 59 U/L  TROPONIN I     Status: None   Collection Time    05/18/14 11:49 AM      Result Value Ref Range   Troponin I <0.30  <0.30 ng/mL   Comment:            Due to the release kinetics of cTnI,     a negative result within the first hours     of the onset of symptoms does not rule out     myocardial infarction with certainty.     If myocardial infarction is still suspected,     repeat the test at appropriate intervals.  PRO B NATRIURETIC PEPTIDE     Status: Abnormal   Collection Time    05/18/14 11:49 AM      Result Value Ref Range   Pro B Natriuretic peptide (BNP) 4996.0 (*) 0 - 450 pg/mL  URINALYSIS, ROUTINE W REFLEX MICROSCOPIC     Status: None   Collection Time    05/18/14 12:23 PM      Result Value Ref Range   Color, Urine YELLOW  YELLOW   APPearance CLEAR  CLEAR   Specific Gravity, Urine 1.010  1.005 - 1.030   pH 5.5  5.0 - 8.0   Glucose, UA NEGATIVE  NEGATIVE mg/dL   Hgb urine dipstick NEGATIVE  NEGATIVE   Bilirubin Urine NEGATIVE  NEGATIVE    Ketones, ur NEGATIVE  NEGATIVE mg/dL   Protein, ur NEGATIVE  NEGATIVE mg/dL   Urobilinogen, UA 0.2  0.0 - 1.0 mg/dL   Nitrite NEGATIVE  NEGATIVE   Leukocytes, UA NEGATIVE  NEGATIVE   Comment: MICROSCOPIC NOT DONE ON URINES WITH NEGATIVE PROTEIN, BLOOD, LEUKOCYTES, NITRITE, OR GLUCOSE <1000 mg/dL.  COMPREHENSIVE METABOLIC PANEL     Status: Abnormal   Collection Time    05/19/14  6:26 AM      Result Value Ref Range   Sodium 140  137 - 147 mEq/L   Potassium 4.4  3.7 - 5.3 mEq/L   Chloride 102  96 - 112 mEq/L   CO2 23  19 - 32 mEq/L   Glucose, Bld 163 (*) 70 - 99 mg/dL   BUN 63 (*) 6 - 23 mg/dL   Creatinine, Ser 2.50 (*) 0.50 - 1.35 mg/dL   Calcium 8.6  8.4 - 10.5 mg/dL   Total Protein 5.9 (*) 6.0 - 8.3 g/dL   Albumin 3.0 (*) 3.5 - 5.2 g/dL   AST 60 (*) 0 - 37 U/L   ALT 11  0 - 53 U/L   Alkaline Phosphatase 158 (*) 39 - 117 U/L   Total Bilirubin 0.9  0.3 - 1.2 mg/dL   GFR calc non Af Amer 21 (*) >90 mL/min   GFR calc Af Amer 25 (*) >90 mL/min   Comment: (NOTE)     The eGFR has been calculated using the CKD EPI equation.     This calculation has not been validated in all clinical situations.     eGFR's persistently <90 mL/min signify possible Chronic Kidney     Disease.   Anion gap 15  5 - 15  CBC     Status:  Abnormal   Collection Time    05/19/14  6:26 AM      Result Value Ref Range   WBC 3.9 (*) 4.0 - 10.5 K/uL   RBC 3.90 (*) 4.22 - 5.81 MIL/uL   Hemoglobin 8.9 (*) 13.0 - 17.0 g/dL   HCT 28.8 (*) 39.0 - 52.0 %   MCV 73.8 (*) 78.0 - 100.0 fL   MCH 22.8 (*) 26.0 - 34.0 pg   MCHC 30.9  30.0 - 36.0 g/dL   RDW 16.8 (*) 11.5 - 15.5 %   Platelets 140 (*) 150 - 400 K/uL    ABGS No results found for this basename: PHART, PCO2, PO2ART, TCO2, HCO3,  in the last 72 hours CULTURES No results found for this or any previous visit (from the past 240 hour(s)). Studies/Results: Ct Abdomen Pelvis Wo Contrast  05/18/2014   CLINICAL DATA:  Abdominal pain.  EXAM: CT ABDOMEN AND PELVIS  WITHOUT CONTRAST  TECHNIQUE: Multidetector CT imaging of the abdomen and pelvis was performed following the standard protocol without IV contrast.  COMPARISON:  August 05, 2012.  FINDINGS: Multilevel degenerative disc disease is noted in the lumbar spine. Mild bilateral pleural effusions are noted with right greater than left.  No gallstones are noted. Stable hepatic cysts are the spleen and pancreas appear normal. Adrenal glands appear normal. No hydronephrosis or renal obstruction is noted. No renal or ureteral calculi are noted. Atherosclerotic calcifications of abdominal aorta are noted without aneurysm formation. The appendix appears normal. There is no evidence of bowel obstruction. Urinary bladder appears normal. Mild bilateral fat containing inguinal hernias are noted. No abnormal fluid collection is noted. Diverticulosis of descending and sigmoid colon is noted without inflammation. No significant adenopathy is noted. Mild inflammatory changes are seen in the soft tissues posterior to the rectum suggesting possible inflammation. Mild wall thickening of the rectum is noted. Stool is noted in the rectum.  IMPRESSION: Diverticulosis of descending and sigmoid colon is noted without inflammation.  Mild bilateral pleural effusions are noted.  Mild wall thickening of the rectum is noted with surrounding inflammatory changes suggesting inflammation of this area.   Electronically Signed   By: [name]   On: 05/18/2014 13:50   Dg Chest Portable 1 View  05/18/2014   CLINICAL DATA:  Abdominal pain and weakness.  EXAM: PORTABLE CHEST - 1 VIEW  COMPARISON:  08/05/2012  FINDINGS: New cardiomegaly. Pulmonary vascularity is at the upper limits of normal. Dual lead pacemaker in place. There is loss of the silhouette pole of the left hemidiaphragm which is probably due to left base atelectasis. Right lung is clear. CABG. No acute osseous abnormality.  IMPRESSION: New cardiomegaly.  Slight left base atelectasis.    Electronically Signed   By: Rozetta Nunnery M.D.   On: 05/18/2014 11:59    Medications: I have reviewed the patient's current medications.  Assesment: Active Problems:   Parkinson disease   Hypertension   Chronic lymphatic leukemia   Chronic kidney disease, stage 3   Weakness    Plan: Medications reviewed Continue IV fluid Will do nephrology consult Will monitor BMP Physical therapy consult.    LOS: 1 day   Itzel Lowrimore 05/19/2014, 8:12 AM

## 2014-05-19 NOTE — Clinical Documentation Improvement (Signed)
Presents with weakness and abdominal pain; work up is continuing. Has history of CLL.   Has abnormal CBC   WBC = 3.9, H&H 8.9/28.8, plts = 140  Described as being pale.  Being monitored serially  Please provide a diagnosis associated with the above data and render an opinion of in next progress note and discharge summary if clinically significant.                                Thank You, Zoila Shutter ,RN Clinical Documentation Specialist:  [place] [facility]

## 2014-05-19 NOTE — Evaluation (Signed)
Physical Therapy Evaluation Patient Details Name: [name] MRN: [id number] DOB: February 04, 1921 Today's Date: 05/19/2014   History of Present Illness  This is a 80 year old man, who has mild to moderate dementia and Parkinson disease, now presents because he has been feeling weak for the last couple of days. He also appears to have some left upper quadrant abdominal pain without any vomiting or nausea. There is no diarrhea. He has a history of chronic large granular lymphocytic disorder. This is being followed with watchful expectation by the oncology team.  Clinical Impression Pt is a 80 year old male who presents to PT with dx of weakness.  Daughter present for evaluation and hx received from daughter.  She reports that the pt has been getting weak at home for the past 3 days, though does report that she just found out he was using a rollator at the adult care facility rather than his cane. Pt up in chair prior to PT visit and bed mobility not assessed.  Attempted standing with use of std cane, as family reports pt typically uses cane for mobility at home, however pt requires std cane and 1 HHA so pt was transitioned to RW for improved balance.  Pt required supervision/min guard for transfers, up to min assist on second trial of sit -> stand secondary to posterior lean. Pt was able to amb with use of RW and min guard for 30 feet, and min assist for movement of RW to turn around. Recommended continued PT in the hospital to address activity tolerance and strengthening for improved functional mobility skills, with transition to HHPT at discharge.  Pt would benefit from use of rollator for gait for improved balance and safety with mobility skills.  Follow Up Recommendations Home health PT    Equipment Recommendations  Other (comment) (Rollator)       Precautions / Restrictions Precautions Precautions: Fall Precaution Comments: Hx of PD Restrictions Weight Bearing Restrictions: No      Mobility  Bed Mobility               General bed mobility comments: Not assessed as pt up in chair prior to PT visit  Transfers Overall transfer level: Needs assistance Equipment used: Rolling walker (2 wheeled) Transfers: Sit to/from [facility] Sit to Stand: Supervision;Min assist Stand pivot transfers: Supervision;Min guard       General transfer comment: Pt was able to transfer sit -> stand on first attempt with supervision, though on second attempt after gait pt required min assist secondary to posterior lean.  VC for technique to perform stand pivot transfer back to chair as pt attempted to sit down too early.   Ambulation/Gait Ambulation/Gait assistance: Min guard;Min assist Ambulation Distance (Feet): 30 Feet Assistive device: Rolling walker (2 wheeled) Gait Pattern/deviations: Step-through pattern;Decreased step length - left;Decreased step length - right;Decreased dorsiflexion - right;Decreased dorsiflexion - left   Gait velocity interpretation: Below normal speed for age/gender General Gait Details: Pt required min assist on occassion to turn RW to change direction/turn around.      Balance Overall balance assessment: Needs assistance     Sitting balance - Comments: Not formally assessed as pt up in chair prior to PT visit.    Standing balance support: Bilateral upper extremity supported;During functional activity Standing balance-Leahy Scale: Fair                               Pertinent Vitals/Pain Pain Assessment: No/denies pain    Home Living  Family/patient expects to be discharged to:: Private residence Living Arrangements: Alone Available Help at Discharge: Family;Available 24 hours/day (Lives alone, though pt goes to adult care center during the day and 1 of his 2 daughters each stay with him at nights) Type of Home: House Home Access: Stairs to enter Entrance Stairs-Rails: Can reach both Entrance Stairs-Number of Steps: 3 Home Layout: One level Home Equipment: Cane - single point 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    Prior Function Level of Independence: Independent with assistive device(s)                  Extremity/Trunk Assessment               Lower Extremity Assessment: Generalized weakness            Cognition Arousal/Alertness: Awake/alert Behavior During Therapy: WFL for tasks assessed/performed Overall Cognitive Status: History of cognitive impairments - at baseline                        Assessment/Plan    PT Assessment Patient needs continued PT services  PT Diagnosis Difficulty walking;Generalized weakness   PT Problem List Decreased strength;Decreased activity tolerance;Decreased mobility;Decreased balance;Decreased knowledge of use of DME  PT Treatment Interventions Gait training;Functional mobility training;Therapeutic activities;Therapeutic exercise;Balance training   PT Goals (Current goals can be found in the Care Plan section) Acute Rehab PT Goals Patient Stated Goal: be able to walk again PT Goal Formulation: With family Time For Goal Achievement: 06/02/14 Potential to Achieve Goals: Good    Frequency Min 3X/week    End of Session Equipment Utilized During Treatment: Gait belt Activity Tolerance: Patient limited by fatigue  Patient left: in chair;with call bell/phone within reach;with family/visitor present           Time: 0347-4259 PT Time Calculation (min): 23 min   Charges:   PT Evaluation $Initial PT Evaluation Tier I: 1 Procedure       Lawson Mahone 05/19/2014, 2:37 PM

## 2014-05-19 NOTE — Care Management Utilization Note (Signed)
UR completed 

## 2014-05-19 NOTE — Consult Note (Signed)
[name] MRN: [id number] DOB/AGE: November 28, 1920 80 y.o. Primary Care Physician:[name],EDWARD L, MD Admit date: 05/18/2014 Chief Complaint:  Chief Complaint  Patient presents with  . Abdominal Pain  . Weakness   HPI:Pt is 80 year old male with past medical hx of HTn who presented to ER with c/o weakness.  HPI dates back to 2-3 days ago when pt started having ad=bdominal pain, left sided, lower quadrant, non progressive, not associated with nausea , vomiting of diarrhea. Pt also c/o weakness, generlized, inablity to walk seconadry to weakness. No c/o chagne in speech/vision No c/o syncope No c/o fever/cough./chills NO c/o hematuria NO c/o   Past Medical History  Diagnosis Date  . Arteriosclerotic cardiovascular disease (ASCVD)     CABG & bioprosthetic aortic valve replace in 1999. PCI of right coronary in 2000, catheter in 07/2002 revealed total occlusion of the LAD; LIMA-LAD patent; patent SVG-OM1, SVG-diagonal ramus, PDA occluded; patent Cx stent, distal 30-40% stenosis Cx; nondom RCA, prox RCA 20-30% lesion, distal RCA 90% lesion; normal EF  . TIA (transient ischemic attack)   . Hypertension     normal CMet and 2011  . Hyperlipidemia     Lipid profile in 12/2009-170, 73, 79, 76  . PVD (peripheral vascular disease)   . Parkinson disease 2010    mild; + dementia  . History of sinus bradycardia     induced by beta blocker  . History of syncope   . Chronic lymphatic leukemia     mild pancytopenia in 02/2010;hemoglobin-12.9, WBC-3.3, platelets-109  . Hepatitis   . Glaucoma 09/1994  . Tobacco abuse, in remission     20 pack years; discontinued 50 years ago  . Insomnia     Middle of the night awakening  . Coronary artery disease   . Dysrhythmia     Bradycardia  . Shortness of breath   . GERD (gastroesophageal reflux disease)   . Dementia         Family History  Problem Relation Age of Onset  . Pneumonia Father   . Cancer Brother   . Breast cancer Sister   .  Diabetes Sister     complications    Social History:  reports that he has quit smoking. He has never used smokeless tobacco. He reports that he does not drink alcohol or use illicit drugs.   Allergies:  Allergies  Allergen Reactions  . Shellfish Allergy Anaphylaxis  . Ambien [Zolpidem Tartrate]     Medications Prior to Admission  Medication Sig Dispense Refill  . apixaban (ELIQUIS) 2.5 MG TABS tablet Take 1 tablet (2.5 mg total) by mouth 2 (two) times daily.  60 tablet  6  . atorvastatin (LIPITOR) 40 MG tablet Take 1 tablet by mouth daily.      . carbidopa-levodopa (SINEMET) 25-100 MG per tablet Take 1 tablet by mouth 2 (two) times daily.       . chlorthalidone (HYGROTON) 25 MG tablet TAKE 1/2 TABLET BY MOUTH EVERY DAY  45 tablet  0  . COMBIGAN 0.2-0.5 % ophthalmic solution Place 1 drop into both eyes daily.       Marland Kitchen donepezil (ARICEPT) 10 MG tablet Take 10 mg by mouth at bedtime.       . furosemide (LASIX) 40 MG tablet Take 40 mg on Tuesdays, Thursdays, & Saturdays      . ipratropium (ATROVENT) 0.06 % nasal spray Place 1 spray into both nostrils 4 (four) times daily.       Marland Kitchen lisinopril-hydrochlorothiazide (PRINZIDE,ZESTORETIC) 20-12.5  MG per tablet Take 1 tablet by mouth daily.           YIR:SWNIO from the symptoms mentioned above,there are no other symptoms referable to all systems reviewed.  Marland Kitchen apixaban  2.5 mg Oral BID  . atorvastatin  40 mg Oral q1800  . brimonidine  1 drop Both Eyes Daily   And  . timolol  1 drop Both Eyes Daily  . carbidopa-levodopa  1 tablet Oral BID  . donepezil  10 mg Oral QHS  . furosemide  40 mg Oral Daily  . lisinopril  20 mg Oral Daily   And  . hydrochlorothiazide  12.5 mg Oral Daily  . ipratropium  1 spray Each Nare QID      Physical Exam: Vital signs in last 24 hours: Temp:  [97.6 F (36.4 C)-98.3 F (36.8 C)] 98.2 F (36.8 C) (09/23 0452) Pulse Rate:  [72-77] 77 (09/23 0452) Resp:  [12-27] 20 (09/23 0452) BP: (122-145)/(36-70)  142/36 mmHg (09/23 0452) SpO2:  [90 %-100 %] 100 % (09/23 0452) Weight:  [183 lb (83.008 kg)-187 lb 6.4 oz (85.004 kg)] 187 lb 6.4 oz (85.004 kg) (09/23 0452) Weight change:  Last BM Date: 05/18/14  Intake/Output from previous day: 09/22 0701 - 09/23 0700 In: 1123.8 [P.O.:240; I.V.:883.8] Out: 201 [Urine:200; Stool:1]     Physical Exam: General- pt is awake follows commands  Resp- No acute REsp distress, decreased bs at bases CVS- S1S2 regular in rate and rhythm+ Diastolic murmur GIT- BS+, soft, NT, ND EXT- 2+ LE Edema, Cyanosis CNS- CN 2-12 grossly intact. Moving all 4 extremities Psych- normal mood and affect    Lab Results: CBC  Recent Labs  05/18/14 1149 05/19/14 0626  WBC 4.3 3.9*  HGB 9.6* 8.9*  HCT 30.9* 28.8*  PLT 146* 140*    BMET  Recent Labs  05/18/14 1149 05/19/14 0626  NA 139 140  K 4.6 4.4  CL 102 102  CO2 22 23  GLUCOSE 179* 163*  BUN 62* 63*  CREATININE 2.40* 2.50*  CALCIUM 9.1 8.6   Trend Creat 2015  2..0=>2.4=>2.5 2014  1.66 2013  1.1--1.3 2012  1.15 2011 1.0-1.19 2008  1.2    Lab Results  Component Value Date   CALCIUM 8.6 05/19/2014   CT abdo showed Mild wall thickening of the rectum is noted with surrounding  inflammatory changes suggesting inflammation of this area.   Impression: 1)Renal  AKI secondary to Multiple Factors                             Hypovolemia- Decreased PO intake and 3 diff diuretics Chlorthalidone + lasix + HCTZ                             ACE               AKI sec to Prerenal /ATN               ATN most likey near plaetau                                   CKD stage 3 .               CKD since 2008               CKD secondary  to HTN/ Age asso decline                Progression of CKD now marked with AKI             2)HTN Target Organ damage  CKD CAD CVA PVD Medication- On RAS blockers- On Diuretics-Lasix+ Hctz  3)Anemia HGb not at goal (9--11)   4)CKD Mineral-Bone Disorder PTH not  avail. Secondary Hyperparathyroidism w/u pending. Phosphorus at goal.&Vitamin 25-OH will check.  5) GI- admitted with Abdominal pain, CT scan showing inflammation in Rectal area. Primary MD following  6)Electrolytes   Normokalemic NOrmonatremic   7)Acid base Co2 at goal     Plan:  Will d/c ace Will d/c HCTZ Will ask for 2d echo Will d/w pcp regarding rectal inflammation,may need GI ehlp. Will increase continue current IVf rate Will follow [facility] Pt may need Cardiology help,primary team to decide. Will change lasix to IV   Arra Connaughton S 05/19/2014, 9:11 AM

## 2014-05-20 DIAGNOSIS — N179 Acute kidney failure, unspecified: Secondary | ICD-10-CM | POA: Diagnosis not present

## 2014-05-20 DIAGNOSIS — I359 Nonrheumatic aortic valve disorder, unspecified: Secondary | ICD-10-CM

## 2014-05-20 LAB — BASIC METABOLIC PANEL
Anion gap: 15 (ref 5–15)
BUN: 64 mg/dL — ABNORMAL HIGH (ref 6–23)
CHLORIDE: 104 meq/L (ref 96–112)
CO2: 21 meq/L (ref 19–32)
Calcium: 8.5 mg/dL (ref 8.4–10.5)
Creatinine, Ser: 2.41 mg/dL — ABNORMAL HIGH (ref 0.50–1.35)
GFR calc Af Amer: 26 mL/min — ABNORMAL LOW (ref >90)
GFR, EST NON AFRICAN AMERICAN: 22 mL/min — AB (ref >90)
GLUCOSE: 178 mg/dL — AB (ref 70–99)
POTASSIUM: 4.3 meq/L (ref 3.7–5.3)
Sodium: 140 mEq/L (ref 137–147)

## 2014-05-20 MED ORDER — FUROSEMIDE 10 MG/ML IJ SOLN
60.0000 mg | Freq: Two times a day (BID) | INTRAMUSCULAR | Status: DC
  Administered 2014-05-20 – 2014-05-21 (×2): 60 mg via INTRAVENOUS
  Filled 2014-05-20 (×2): qty 6

## 2014-05-20 NOTE — Clinical Documentation Improvement (Signed)
Presents with weakness and has multiple co morbidities: ARF with ATN, CLL.  After study, please clarify the likely etiology of the patient's weakness. Your response will allow HIM to accurately code the chart and receive proper payment.  Eg. Weakness likely secondary to ___________________.   Thank You, Zoila Shutter ,RN Clinical Documentation Specialist:  [place] [employer] [facility]

## 2014-05-20 NOTE — Progress Notes (Signed)
Physical Therapy Treatment Patient Details Name: [name] MRN: [id number] DOB: 1921-07-11 Today's Date: 05/20/2014    History of Present Illness This is a 80 year old man, who has mild to moderate dementia and Parkinson disease, now presents because he has been feeling weak for the last couple of days. He also appears to have some left upper quadrant abdominal pain without any vomiting or nausea. There is no diarrhea. He has a history of chronic large granular lymphocytic disorder. This is being followed with watchful expectation by the oncology team.    PT Comments    Pt was awake but drowsy at the time of my arrival.  Daughter present.  Pt was agreeable to work with me.  He denies any abdominal pain.  He fatigued easily but was able to tolerate gentle LE strengthening exercise.  He was instructed in transfer supine to sit and in scooting to head of bed.  Pt needs assist to shift his weight anteriorly during sit to stand transfer.  He definitely needs the support of a walker for gait due to generalized weakness.  I do not think that a 4 wheeled walker will afford him enough stability.  Family has a standard and 2 wheeled walker at home.    Follow Up Recommendations  Home health PT     Equipment Recommendations  None recommended by PT (cancel Rollator-it is not enough support for pt.)    Recommendations for Other Services  none     Precautions / Restrictions Precautions Precautions: Fall Restrictions Weight Bearing Restrictions: No    Mobility  Bed Mobility Overal bed mobility: Needs Assistance Bed Mobility: Supine to Sit     Supine to sit: Min assist     General bed mobility comments: pt needs assist to bring trunk anteriorly  Transfers Overall transfer level: Needs assistance Equipment used: Rolling walker (2 wheeled) Transfers: Sit to/from Stand Sit to Stand: Min assist         General transfer comment: pt needs manual and verbal cues to shift weight  anteriorly  Ambulation/Gait Ambulation/Gait assistance: Min assist Ambulation Distance (Feet): 30 Feet Assistive device: Rolling walker (2 wheeled) Gait Pattern/deviations: Decreased stride length;Shuffle;Decreased weight shift to right;Decreased weight shift to left   Gait velocity interpretation: Below normal speed for age/gender General Gait Details: pt instructed in turning walker...needed frequent instruction to hold walker closer to him   Stairs            Wheelchair Mobility    Modified Rankin (Stroke Patients Only)       Balance Overall balance assessment: Needs assistance Sitting-balance support: Feet supported;No upper extremity supported Sitting balance-Leahy Scale: Good     Standing balance support: Bilateral upper extremity supported Standing balance-Leahy Scale: Fair                      Cognition Arousal/Alertness: Awake/alert Behavior During Therapy: WFL for tasks assessed/performed Overall Cognitive Status: History of cognitive impairments - at baseline                      Exercises General Exercises - Lower Extremity Ankle Circles/Pumps: AROM;Both;10 reps;Supine Short Arc Quad: AROM;Both;10 reps;Supine Heel Slides: AROM;Both;10 reps;Supine Other Exercises Other Exercises: trunk rolling x 10 Other Exercises: clam exercise x 10    General Comments        Pertinent Vitals/Pain Pain Assessment: No/denies pain    Home Living  Prior Function            PT Goals (current goals can now be found in the care plan section) Progress towards PT goals: Progressing toward goals    Frequency       PT Plan Current plan remains appropriate    Co-evaluation             End of Session Equipment Utilized During Treatment: Gait belt Activity Tolerance: Patient limited by fatigue Patient left: in chair;with call bell/phone within reach;with chair alarm set;with family/visitor present      Time: 4332-9518 PT Time Calculation (min): 30 min  Charges:  $Gait Training: 8-22 mins $Therapeutic Exercise: 8-22 mins                    G Codes:      Sable Feil 05-Jun-2014, 1:59 PM

## 2014-05-20 NOTE — Progress Notes (Signed)
Subjective: Patient is resting. He feels better. No abdominal pain. His abdominal CT showed inflammatory change around the rectum.  Objective: Vital signs in last 24 hours: Temp:  [97.7 F (36.5 C)-98.5 F (36.9 C)] 97.8 F (36.6 C) (09/24 0745) Pulse Rate:  [62-77] 76 (09/24 0745) Resp:  [14-20] 18 (09/24 0745) BP: (120-143)/(36-45) 120/40 mmHg (09/24 0950) SpO2:  [96 %-99 %] 99 % (09/24 0745) Weight change:  Last BM Date: 05/19/14  Intake/Output from previous day:    PHYSICAL EXAM General appearance: fatigued and slowed mentation Resp: diminished breath sounds bilaterally and rhonchi bilaterally Cardio: S1, S2 normal GI: soft, non-tender; bowel sounds normal; no masses,  no organomegaly Extremities: edema 2++ leg edema  Lab Results:  Results for orders placed during the hospital encounter of 05/18/14 (from the past 48 hour(s))  COMPREHENSIVE METABOLIC PANEL     Status: Abnormal   Collection Time    05/19/14  6:26 AM      Result Value Ref Range   Sodium 140  137 - 147 mEq/L   Potassium 4.4  3.7 - 5.3 mEq/L   Chloride 102  96 - 112 mEq/L   CO2 23  19 - 32 mEq/L   Glucose, Bld 163 (*) 70 - 99 mg/dL   BUN 63 (*) 6 - 23 mg/dL   Creatinine, Ser 2.50 (*) 0.50 - 1.35 mg/dL   Calcium 8.6  8.4 - 10.5 mg/dL   Total Protein 5.9 (*) 6.0 - 8.3 g/dL   Albumin 3.0 (*) 3.5 - 5.2 g/dL   AST 60 (*) 0 - 37 U/L   ALT 11  0 - 53 U/L   Alkaline Phosphatase 158 (*) 39 - 117 U/L   Total Bilirubin 0.9  0.3 - 1.2 mg/dL   GFR calc non Af Amer 21 (*) >90 mL/min   GFR calc Af Amer 25 (*) >90 mL/min   Comment: (NOTE)     The eGFR has been calculated using the CKD EPI equation.     This calculation has not been validated in all clinical situations.     eGFR's persistently <90 mL/min signify possible Chronic Kidney     Disease.   Anion gap 15  5 - 15  CBC     Status: Abnormal   Collection Time    05/19/14  6:26 AM      Result Value Ref Range   WBC 3.9 (*) 4.0 - 10.5 K/uL   RBC 3.90 (*)  4.22 - 5.81 MIL/uL   Hemoglobin 8.9 (*) 13.0 - 17.0 g/dL   HCT 28.8 (*) 39.0 - 52.0 %   MCV 73.8 (*) 78.0 - 100.0 fL   MCH 22.8 (*) 26.0 - 34.0 pg   MCHC 30.9  30.0 - 36.0 g/dL   RDW 16.8 (*) 11.5 - 15.5 %   Platelets 140 (*) 150 - 400 K/uL  BASIC METABOLIC PANEL     Status: Abnormal   Collection Time    05/20/14  6:39 AM      Result Value Ref Range   Sodium 140  137 - 147 mEq/L   Potassium 4.3  3.7 - 5.3 mEq/L   Chloride 104  96 - 112 mEq/L   CO2 21  19 - 32 mEq/L   Glucose, Bld 178 (*) 70 - 99 mg/dL   BUN 64 (*) 6 - 23 mg/dL   Creatinine, Ser 2.41 (*) 0.50 - 1.35 mg/dL   Calcium 8.5  8.4 - 10.5 mg/dL   GFR calc non Af  Amer 22 (*) >90 mL/min   GFR calc Af Amer 26 (*) >90 mL/min   Comment: (NOTE)     The eGFR has been calculated using the CKD EPI equation.     This calculation has not been validated in all clinical situations.     eGFR's persistently <90 mL/min signify possible Chronic Kidney     Disease.   Anion gap 15  5 - 15    ABGS No results found for this basename: PHART, PCO2, PO2ART, TCO2, HCO3,  in the last 72 hours CULTURES No results found for this or any previous visit (from the past 240 hour(s)). Studies/Results: Ct Abdomen Pelvis Wo Contrast  05/18/2014   CLINICAL DATA:  Abdominal pain.  EXAM: CT ABDOMEN AND PELVIS WITHOUT CONTRAST  TECHNIQUE: Multidetector CT imaging of the abdomen and pelvis was performed following the standard protocol without IV contrast.  COMPARISON:  August 05, 2012.  FINDINGS: Multilevel degenerative disc disease is noted in the lumbar spine. Mild bilateral pleural effusions are noted with right greater than left.  No gallstones are noted. Stable hepatic cysts are the spleen and pancreas appear normal. Adrenal glands appear normal. No hydronephrosis or renal obstruction is noted. No renal or ureteral calculi are noted. Atherosclerotic calcifications of abdominal aorta are noted without aneurysm formation. The appendix appears normal. There  is no evidence of bowel obstruction. Urinary bladder appears normal. Mild bilateral fat containing inguinal hernias are noted. No abnormal fluid collection is noted. Diverticulosis of descending and sigmoid colon is noted without inflammation. No significant adenopathy is noted. Mild inflammatory changes are seen in the soft tissues posterior to the rectum suggesting possible inflammation. Mild wall thickening of the rectum is noted. Stool is noted in the rectum.  IMPRESSION: Diverticulosis of descending and sigmoid colon is noted without inflammation.  Mild bilateral pleural effusions are noted.  Mild wall thickening of the rectum is noted with surrounding inflammatory changes suggesting inflammation of this area.   Electronically Signed   By: [name]   On: 05/18/2014 13:50    Medications: I have reviewed the patient's current medications.  Assesment: Active Problems:   Parkinson disease   Hypertension   Chronic lymphatic leukemia   Chronic kidney disease, stage 3   Weakness    Plan: Medications reviewed Continue IV fluid Will do nephrology consult appreciated Will monitor BMP GI consult Continue physical therapy    LOS: 2 days   [name] 05/20/2014, 12:28 PM

## 2014-05-20 NOTE — Progress Notes (Signed)
Subjective: Interval History: has complaints has some pain. Is generalized. Patient denies any nausea or vomiting. Denies also any difficulty in breathing..  Objective: Vital signs in last 24 hours: Temp:  [97.7 F (36.5 C)-98.5 F (36.9 C)] 97.8 F (36.6 C) (09/24 0745) Pulse Rate:  [62-77] 76 (09/24 0745) Resp:  [14-20] 18 (09/24 0745) BP: (120-143)/(36-45) 135/45 mmHg (09/24 0745) SpO2:  [96 %-99 %] 99 % (09/24 0745) Weight change:   Intake/Output from previous day:   Intake/Output this shift:    General appearance: alert, cooperative and no distress Resp: diminished breath sounds posterior - bilateral Cardio: regular rate and rhythm, S1, S2 normal, no murmur, click, rub or gallop GI: soft, non-tender; bowel sounds normal; no masses,  no organomegaly Extremities: edema 2+ edema  Lab Results:  Recent Labs  05/18/14 1149 05/19/14 0626  WBC 4.3 3.9*  HGB 9.6* 8.9*  HCT 30.9* 28.8*  PLT 146* 140*   BMET:  Recent Labs  05/19/14 0626 05/20/14 0639  NA 140 140  K 4.4 4.3  CL 102 104  CO2 23 21  GLUCOSE 163* 178*  BUN 63* 64*  CREATININE 2.50* 2.41*  CALCIUM 8.6 8.5   No results found for this basename: PTH,  in the last 72 hours Iron Studies: No results found for this basename: IRON, TIBC, TRANSFERRIN, FERRITIN,  in the last 72 hours  Studies/Results: Ct Abdomen Pelvis Wo Contrast  05/18/2014   CLINICAL DATA:  Abdominal pain.  EXAM: CT ABDOMEN AND PELVIS WITHOUT CONTRAST  TECHNIQUE: Multidetector CT imaging of the abdomen and pelvis was performed following the standard protocol without IV contrast.  COMPARISON:  August 05, 2012.  FINDINGS: Multilevel degenerative disc disease is noted in the lumbar spine. Mild bilateral pleural effusions are noted with right greater than left.  No gallstones are noted. Stable hepatic cysts are the spleen and pancreas appear normal. Adrenal glands appear normal. No hydronephrosis or renal obstruction is noted. No renal or ureteral  calculi are noted. Atherosclerotic calcifications of abdominal aorta are noted without aneurysm formation. The appendix appears normal. There is no evidence of bowel obstruction. Urinary bladder appears normal. Mild bilateral fat containing inguinal hernias are noted. No abnormal fluid collection is noted. Diverticulosis of descending and sigmoid colon is noted without inflammation. No significant adenopathy is noted. Mild inflammatory changes are seen in the soft tissues posterior to the rectum suggesting possible inflammation. Mild wall thickening of the rectum is noted. Stool is noted in the rectum.  IMPRESSION: Diverticulosis of descending and sigmoid colon is noted without inflammation.  Mild bilateral pleural effusions are noted.  Mild wall thickening of the rectum is noted with surrounding inflammatory changes suggesting inflammation of this area.   Electronically Signed   By: [name]   On: 05/18/2014 13:50   Dg Chest Portable 1 View  05/18/2014   CLINICAL DATA:  Abdominal pain and weakness.  EXAM: PORTABLE CHEST - 1 VIEW  COMPARISON:  08/05/2012  FINDINGS: New cardiomegaly. Pulmonary vascularity is at the upper limits of normal. Dual lead pacemaker in place. There is loss of the silhouette pole of the left hemidiaphragm which is probably due to left base atelectasis. Right lung is clear. CABG. No acute osseous abnormality.  IMPRESSION: New cardiomegaly.  Slight left base atelectasis.   Electronically Signed   By: Rozetta Nunnery M.D.   On: 05/18/2014 11:59    I have reviewed the patient's current medications.  Assessment/Plan: Problem #1 acute kidney injury superimposed on chronic. Etiology for  his acute kidney injury was thought to be secondary to ATN/ACE inhibitor. Presently his BUN and creatinine seems to be improving. Problem #2 history of chronic renal failure: Possibly stage III Problem #3 coronary artery disease: A. status post CABG. Presently he doesn't have any chest pain. Problem  #4 history of CHF: Presently is a symptomatic but patient has bilateral leg edema. Problem #5 history of CLL Problem #6 history of hypertension: His blood pressure is reasonably controlled Problem #7 anemia  Plan: Decrease his IV fluid to to 50 cc per hour We'll increase Lasix to 60 mg IV twice a day We'll check his basic metabolic panel and phosphorus in the morning.   LOS: 2 days   Burak Zerbe S 05/20/2014,8:41 AM

## 2014-05-20 NOTE — Progress Notes (Signed)
*  PRELIMINARY RESULTS* Echocardiogram 2D Echocardiogram has been performed.  Leavy Cella 05/20/2014, 1:00 PM

## 2014-05-21 DIAGNOSIS — N183 Chronic kidney disease, stage 3 unspecified: Secondary | ICD-10-CM

## 2014-05-21 DIAGNOSIS — R5383 Other fatigue: Secondary | ICD-10-CM

## 2014-05-21 DIAGNOSIS — R5381 Other malaise: Secondary | ICD-10-CM

## 2014-05-21 DIAGNOSIS — R933 Abnormal findings on diagnostic imaging of other parts of digestive tract: Secondary | ICD-10-CM

## 2014-05-21 LAB — BASIC METABOLIC PANEL
Anion gap: 16 — ABNORMAL HIGH (ref 5–15)
BUN: 64 mg/dL — ABNORMAL HIGH (ref 6–23)
CHLORIDE: 104 meq/L (ref 96–112)
CO2: 20 mEq/L (ref 19–32)
Calcium: 8.4 mg/dL (ref 8.4–10.5)
Creatinine, Ser: 2.41 mg/dL — ABNORMAL HIGH (ref 0.50–1.35)
GFR calc Af Amer: 26 mL/min — ABNORMAL LOW (ref >90)
GFR, EST NON AFRICAN AMERICAN: 22 mL/min — AB (ref >90)
GLUCOSE: 166 mg/dL — AB (ref 70–99)
POTASSIUM: 4.1 meq/L (ref 3.7–5.3)
SODIUM: 140 meq/L (ref 137–147)

## 2014-05-21 MED ORDER — FUROSEMIDE 10 MG/ML IJ SOLN
100.0000 mg | Freq: Two times a day (BID) | INTRAMUSCULAR | Status: DC
  Administered 2014-05-21 – 2014-05-22 (×2): 100 mg via INTRAVENOUS
  Filled 2014-05-21 (×6): qty 10

## 2014-05-21 NOTE — Progress Notes (Signed)
Patient up in chair at this time after working with physical therapy. Tolerated well. No complaints voiced at this time. Family at bedside.

## 2014-05-21 NOTE — Progress Notes (Signed)
Subjective: Patient feels better. He is working with physical therapy and recommended home PT to continue. His renal function no change in his BUN/Cr. He is being followed by nephrology. GI is request for the evaluation of wall thickening and inflammatory changes on CT Scan.  Objective: Vital signs in last 24 hours: Temp:  [97.7 F (36.5 C)-98.1 F (36.7 C)] 97.7 F (36.5 C) (09/25 0636) Pulse Rate:  [70-86] 86 (09/25 0636) Resp:  [13-18] 13 (09/25 0636) BP: (112-134)/(37-66) 134/37 mmHg (09/25 0636) SpO2:  [98 %-100 %] 98 % (09/25 0636) Weight change:  Last BM Date: 05/20/14  Intake/Output from previous day: 09/24 0701 - 09/25 0700 In: 1840.4 [P.O.:480; I.V.:1360.4] Out: 400 [Urine:400]  PHYSICAL EXAM General appearance: fatigued and slowed mentation Resp: diminished breath sounds bilaterally and rhonchi bilaterally Cardio: S1, S2 normal GI: soft, non-tender; bowel sounds normal; no masses,  no organomegaly Extremities: edema 2++ leg edema  Lab Results:  Results for orders placed during the hospital encounter of 05/18/14 (from the past 48 hour(s))  BASIC METABOLIC PANEL     Status: Abnormal   Collection Time    05/20/14  6:39 AM      Result Value Ref Range   Sodium 140  137 - 147 mEq/L   Potassium 4.3  3.7 - 5.3 mEq/L   Chloride 104  96 - 112 mEq/L   CO2 21  19 - 32 mEq/L   Glucose, Bld 178 (*) 70 - 99 mg/dL   BUN 64 (*) 6 - 23 mg/dL   Creatinine, Ser 2.41 (*) 0.50 - 1.35 mg/dL   Calcium 8.5  8.4 - 10.5 mg/dL   GFR calc non Af Amer 22 (*) >90 mL/min   GFR calc Af Amer 26 (*) >90 mL/min   Comment: (NOTE)     The eGFR has been calculated using the CKD EPI equation.     This calculation has not been validated in all clinical situations.     eGFR's persistently <90 mL/min signify possible Chronic Kidney     Disease.   Anion gap 15  5 - 15  BASIC METABOLIC PANEL     Status: Abnormal   Collection Time    05/21/14  6:06 AM      Result Value Ref Range   Sodium 140  137  - 147 mEq/L   Potassium 4.1  3.7 - 5.3 mEq/L   Chloride 104  96 - 112 mEq/L   CO2 20  19 - 32 mEq/L   Glucose, Bld 166 (*) 70 - 99 mg/dL   BUN 64 (*) 6 - 23 mg/dL   Creatinine, Ser 2.41 (*) 0.50 - 1.35 mg/dL   Calcium 8.4  8.4 - 10.5 mg/dL   GFR calc non Af Amer 22 (*) >90 mL/min   GFR calc Af Amer 26 (*) >90 mL/min   Comment: (NOTE)     The eGFR has been calculated using the CKD EPI equation.     This calculation has not been validated in all clinical situations.     eGFR's persistently <90 mL/min signify possible Chronic Kidney     Disease.   Anion gap 16 (*) 5 - 15    ABGS No results found for this basename: PHART, PCO2, PO2ART, TCO2, HCO3,  in the last 72 hours CULTURES No results found for this or any previous visit (from the past 240 hour(s)). Studies/Results: No results found.  Medications: I have reviewed the patient's current medications.  Assesment: Active Problems:   Parkinson disease  Hypertension   Chronic lymphatic leukemia   Chronic kidney disease, stage 3   Weakness    Plan: Medications reviewed Continue IV fluid Will monitor BMP GI consult Continue physical therapy As nephrology recommendation   LOS: 3 days   [name] 05/21/2014, 8:13 AM

## 2014-05-21 NOTE — Care Management Note (Signed)
    Page 1 of 2   05/24/2014     11:23:55 AM CARE MANAGEMENT NOTE 05/24/2014  Patient:  PRINSTON, KYNARD   Account Number:  [id number]  Date Initiated:  05/21/2014  Documentation initiated by:  Vladimir Creeks  Subjective/Objective Assessment:   admitted with CHF. Pt is from home, lives alone, but attends an adult daycare M-F and his daughters take turns at night, staying with him. He is usually OK alone for a while if they do not come for a while     Action/Plan:   after he gets off the daycare bus, but is going to need more help now, unless he improves more before D/C. [place], Evlyn Clines states they may have to hire sitters to assist. They would like [facility] for [facility] to assist also   Anticipated DC Date:  05/23/2014   Anticipated DC Plan:  [place]  CM consult      [place]   Choice offered to / List presented to:  C-4 Adult Children   DME arranged  [place]      DME agency  [place] arranged  HH-1 RN  [place].   Status of service:  Completed, signed off Medicare Important Message given?  YES (If response is "NO", the following Medicare IM given date fields will be blank) Date Medicare IM given:  05/21/2014 Medicare IM given by:  Vladimir Creeks Date Additional Medicare IM given:  05/24/2014 Additional Medicare IM given by:  Theophilus Kinds  Discharge Disposition:  [place]  Per UR Regulation:  Reviewed for med. necessity/level of care/duration of stay  If discussed at Long Length of Stay Meetings, dates discussed:    Comments:  05/24/14 [place], RN BSN CM PT to be discharged home today with [place] and PT (per family choice). Romualdo Bolk of [facility] is aware and will collect the pts information from the chart. [place] services to start within 48 hours of discharge. Rollator to be delivered to  pts home by [facility] (per family choice). Emma with [facility] is aware and will place pts order. Pt and pts nurse aware of discharge arrangements.  05/21/14 1700 Vladimir Creeks RN/CM

## 2014-05-21 NOTE — Progress Notes (Signed)
Physical Therapy Treatment Patient Details Name: [name] MRN: [id number] DOB: [birth year] Today's Date: 05/21/2014    History of Present Illness This is a 80 year old man, who has mild to moderate dementia and Parkinson disease, now presents because he has been feeling weak for the last couple of days. He also appears to have some left upper quadrant abdominal pain without any vomiting or nausea. There is no diarrhea. He has a history of chronic large granular lymphocytic disorder. This is being followed with watchful expectation by the oncology team.    PT Comments    Pt was awake and agreeable to PT this AM. Both daughters were present.  Because pt fatigues so rapidly, I chose to begin with very limited exercise in the bed to warm up joints and muscles prior to gait.  His transfer ability out of bed was improved from yesterday.  With gait, he was able to flex hips and knees during swing trough much more effectively and he needed fewer cues to maintain erect posture.  However, after walking 30' he had a sudden onset of extreme fatigue and needed to be seated.  Vital signs were taken and found to be WNL except he had a very low diastolic BP (546/27).  Daughter stated that this was the pts' norm, however.  After resting about 10 minutes,, he was able to walk 10' to his room.  Pt is not going to be able to continue to live alone without 24 hour support.  Daughters rotate in and out.  I did discuss with them as to how they would access SNF after discharge if needed.  Follow Up Recommendations  Home health PT (family instructed how to access SNF from home if needed)     Equipment Recommendations  None recommended by PT    Recommendations for Other Services  OT     Precautions / Restrictions Precautions Precautions: Fall Restrictions Weight Bearing Restrictions: No    Mobility  Bed Mobility Overal bed mobility: Needs Assistance Bed Mobility: Supine to Sit     Supine to sit:  Supervision     General bed mobility comments: pt able to move trunk more efficiently today even with head of bed elevated to only 20 degrees  Transfers Overall transfer level: Needs assistance Equipment used: Rolling walker (2 wheeled) Transfers: Sit to/from Stand Sit to Stand: Min guard         General transfer comment: pt was verbally instructed in anterior weight shift to facilitate transfe and needed no manual assist today  Ambulation/Gait Ambulation/Gait assistance: Min assist Ambulation Distance (Feet): 30 Feet (then ambulated 10'x1) Assistive device: Rolling walker (2 wheeled) Gait Pattern/deviations: Decreased stride length;Decreased weight shift to left;Decreased weight shift to right   Gait velocity interpretation: Below normal speed for age/gender General Gait Details: pt wanted to try to ambulate into the hallway and was successful, but after going 30' he had a sudden episode of weakness and a chair had to be placed under him...he rested for about 10 min and then walked 10' back to his room   Stairs            Wheelchair Mobility    Modified Rankin (Stroke Patients Only)       Balance Overall balance assessment: Needs assistance Sitting-balance support: No upper extremity supported Sitting balance-Leahy Scale: Good     Standing balance support: Bilateral upper extremity supported Standing balance-Leahy Scale: Fair  Cognition Arousal/Alertness: Awake/alert Behavior During Therapy: WFL for tasks assessed/performed Overall Cognitive Status: History of cognitive impairments - at baseline                      Exercises General Exercises - Lower Extremity Ankle Circles/Pumps: AROM;Both;10 reps;Supine Short Arc Quad: AROM;Both;10 reps;Supine Heel Slides: AROM;AAROM;Both;10 reps;Supine    General Comments        Pertinent Vitals/Pain Pain Assessment: No/denies pain    Home Living                       Prior Function            PT Goals (current goals can now be found in the care plan section) Progress towards PT goals: Goals downgraded-see care plan    Frequency       PT Plan Current plan remains appropriate;Other (comment) (pt lives alone with daughters assist...he may not be able to manage at home and would need SNF)    Co-evaluation             End of Session Equipment Utilized During Treatment: Gait belt Activity Tolerance: Patient limited by fatigue Patient left: in chair;with call bell/phone within reach;with chair alarm set;with family/visitor present     Time: 0901-     Charges:  $Gait Training: 8-22 mins $Therapeutic Activity: 8-22 mins                    G Codes:      Sable Feil 06-05-14, 10:31 AM

## 2014-05-21 NOTE — Consult Note (Signed)
Referring Provider: Dr. Rosita Fire, MD Primary Care Physician:  [name], MD Primary Gastroenterologist:  Dr. Laural Golden  Reason for Consultation:    Rectal wall thickening on abdominopelvic CT.  HPI:  History is provided by his daughter Truddie Crumble who is at bedside.  Patient is a 80 year-old [place] American male with multiple medical problems who was in usual state of health until 3 days ago when he developed profound weakness and was unable to walk. Patient was brought to the emergency room for evaluation. He was felt to be dehydrated. He had abdominopelvic CT in emergency room apparently for abdominal pain and this study revealed the rectal wall thickening and inflammatory changes in presacral area. Patient's daughter states did not complain of pain prior to admission. He also has not complained of abdominal pain while he has been in the hospital. Patient was evaluated by Drs. Theador Hawthorne and [place] of nephrology service and felt to have acute renal injury on top of chronic kidney disease and has been hydrated. IV fluids were discontinued today because of increasing LE edema and CHF. Patient is receiving physical therapy but he was not able to do much walking on account of weakness. Patient denies nausea vomiting abdominal pain melena or rectal bleeding. He is having regular bowel movements. Patient lives at home with his daughter Ms.Levi Strauss.  Patient was last seen by me in December 2013 the knee had diverticulitis. Last colonoscopy was in 2007.    Past Medical History  Diagnosis Date  . Arteriosclerotic cardiovascular disease (ASCVD)     CABG & bioprosthetic aortic valve replace in 1999. PCI of right coronary in 2000, catheter in 07/2002 revealed total occlusion of the LAD; LIMA-LAD patent; patent SVG-OM1, SVG-diagonal ramus, PDA occluded; patent Cx stent, distal 30-40% stenosis Cx; nondom RCA, prox RCA 20-30% lesion, distal RCA 90% lesion; normal EF  . TIA (transient ischemic  attack)   . Hypertension     normal CMet and 2011  . Hyperlipidemia     Lipid profile in 12/2009-170, 73, 79, 76  . PVD (peripheral vascular disease)   . Parkinson disease 2010    mild; + dementia  . History of sinus bradycardia     induced by beta blocker  . History of syncope   . Chronic lymphatic leukemia     mild pancytopenia in 02/2010;hemoglobin-12.9, WBC-3.3, platelets-109  . Hepatitis   . Glaucoma 09/1994  . Tobacco abuse, in remission     20 pack years; discontinued 50 years ago  . Insomnia     Middle of the night awakening  . Coronary artery disease   . Dysrhythmia     Bradycardia  . Shortness of breath   . GERD (gastroesophageal reflux disease)   . Dementia     Past Surgical History  Procedure Laterality Date  . Retinal detachment surgery  1989  . Tonsillectomy    . Transurethral resection of prostate      Benign prostatic hypertrophy  . Coronary artery bypass graft  1999    + Bioprosthetic AVR  . Colonoscopy  2011  . A-v cardiac pacemaker insertion  02/2012    [employer] Accent DR RF    Prior to Admission medications   Medication Sig Start Date End Date Taking? Authorizing Provider  apixaban (ELIQUIS) 2.5 MG TABS tablet Take 1 tablet (2.5 mg total) by mouth 2 (two) times daily. 10/28/13  Yes Evans Lance, MD  atorvastatin (LIPITOR) 40 MG tablet Take 1 tablet by mouth daily. 03/18/14  Yes  Historical Provider, MD  carbidopa-levodopa (SINEMET) 25-100 MG per tablet Take 1 tablet by mouth 2 (two) times daily.  02/08/11  Yes Historical Provider, MD  chlorthalidone (HYGROTON) 25 MG tablet TAKE 1/2 TABLET BY MOUTH EVERY DAY 05/17/14  Yes Evans Lance, MD  COMBIGAN 0.2-0.5 % ophthalmic solution Place 1 drop into both eyes daily.  08/09/11  Yes Historical Provider, MD  donepezil (ARICEPT) 10 MG tablet Take 10 mg by mouth at bedtime.  02/23/11  Yes Historical Provider, MD  furosemide (LASIX) 40 MG tablet Take 40 mg on Tuesdays, Thursdays, & Saturdays 01/15/13  Yes Historical  Provider, MD  ipratropium (ATROVENT) 0.06 % nasal spray Place 1 spray into both nostrils 4 (four) times daily.  12/11/12  Yes Historical Provider, MD  lisinopril-hydrochlorothiazide (PRINZIDE,ZESTORETIC) 20-12.5 MG per tablet Take 1 tablet by mouth daily.   Yes Historical Provider, MD    Current Facility-Administered Medications  Medication Dose Route Frequency Provider Last Rate Last Dose  . apixaban (ELIQUIS) tablet 2.5 mg  2.5 mg Oral BID Doree Albee, MD   2.5 mg at 05/21/14 0958  . atorvastatin (LIPITOR) tablet 40 mg  40 mg Oral q1800 Nimish C Anastasio Champion, MD   40 mg at 05/21/14 1707  . brimonidine (ALPHAGAN) 0.15 % ophthalmic solution 1 drop  1 drop Both Eyes Daily [name], MD   1 drop at 05/21/14 0957   And  . timolol (TIMOPTIC) 0.5 % ophthalmic solution 1 drop  1 drop Both Eyes Daily [name], MD   1 drop at 05/21/14 [id number]  . carbidopa-levodopa (SINEMET IR) 25-100 MG per tablet immediate release 1 tablet  1 tablet Oral BID Doree Albee, MD   1 tablet at 05/21/14 0958  . donepezil (ARICEPT) tablet 10 mg  10 mg Oral QHS Nimish C Gosrani, MD   10 mg at 05/20/14 2130  . furosemide (LASIX) 100 mg in dextrose 5 % 50 mL IVPB  100 mg Intravenous BID Harriett Sine, MD   100 mg at 05/21/14 1707  . ipratropium (ATROVENT) 0.03 % nasal spray 1 spray  1 spray Each Nare QID [name], MD   1 spray at 05/20/14 2131  . ondansetron (ZOFRAN) tablet 4 mg  4 mg Oral Q6H PRN Nimish C Gosrani, MD       Or  . ondansetron (ZOFRAN) injection 4 mg  4 mg Intravenous Q6H PRN Doree Albee, MD        Allergies as of 05/18/2014 - Review Complete 05/18/2014  Allergen Reaction Noted  . Shellfish allergy Anaphylaxis 03/06/2011  . Ambien [zolpidem tartrate]  10/21/2012    Family History  Problem Relation Age of Onset  . Pneumonia Father   . Cancer Brother   . Breast cancer Sister   . Diabetes Sister     complications    History   Social History  . Marital Status:  Widowed    Spouse Name: N/A    Number of Children: N/A  . Years of Education: N/A   Occupational History  . Not on file.   Social History Main Topics  . Smoking status: Former Research scientist (life sciences)  . Smokeless tobacco: Never Used  . Alcohol Use: No     Comment: rare  . Drug Use: No  . Sexual Activity: Not Currently   Other Topics Concern  . Not on file   Social History Narrative   Retired. Regularly exercises. Has 3 daughters alive and well.     Review of  Systems: See HPI, otherwise normal ROS  Physical Exam: Temp:  [97.7 F (36.5 C)-98.1 F (36.7 C)] 97.7 F (36.5 C) (09/25 1455) Pulse Rate:  [75-92] 92 (09/25 1455) Resp:  [13-20] 20 (09/25 1455) BP: (112-134)/(37-66) 121/40 mmHg (09/25 1455) SpO2:  [95 %-100 %] 95 % (09/25 1455) Weight:  [190 lb 4.8 oz (86.32 kg)] 190 lb 4.8 oz (86.32 kg) (09/25 1028) Last BM Date: 05/20/14 Patient is well-developed well-nourished elderly male in no acute distress. He is alert and responds appropriately to questions. Conjunctiva is pale and sclerae is nonicteric. Oral pharyngeal mucosa is normal. He is partial upper and lower dental plates. No thyromegaly or neck masses noted. Cardiac exam with regular rhythm normal S1-S2 and loud high-pitched systolic ejection murmur which is best heard at left upper sternal border. It conducts to neck and epigastric region. Abdomen symmetrical. Bowel sounds are normal. Abdomen is soft with mild tenderness in epigastric region which appears to be left lobe of the liver. No organomegaly or masses. Rectal examination reveals no abnormality. There is soft stool in the rectum and it is guaiac-negative. He has 2+ pitting edema involving both legs.  Lab Results:  Recent Labs  05/19/14 0626  WBC 3.9*  HGB 8.9*  HCT 28.8*  PLT 140*   BMET  Recent Labs  05/19/14 0626 05/20/14 0639 05/21/14 0606  NA 140 140 140  K 4.4 4.3 4.1  CL 102 104 104  CO2 23 21 20   GLUCOSE 163* 178* 166*  BUN 63* 64* 64*   CREATININE 2.50* 2.41* 2.41*  CALCIUM 8.6 8.5 8.4   LFT  Recent Labs  05/19/14 0626  PROT 5.9*  ALBUMIN 3.0*  AST 60*  ALT 11  ALKPHOS 158*  BILITOT 0.9    Assessment; Patient is  80 year-old [place] American male admitted with weakness and felt to have dehydration. He had  abdominopelvic CT in the ER because of abdominal pain and it revealed thickening to rectal wall and inflammatory changes to surrounding area. Rectal examination reveals no abnormality and stool is guaiac negative. He could have proctitis however he does not have symptoms to go along with this diagnosis. Since patient is not having rectal bleeding or obstructive symptoms it would be reasonable to monitor him while he is undergoing therapy for CHF. He has chronic anemia but H&H has dropped. As noted above no evidence of active GI bleed and drop may be due to the acute illness or hydration which now has been discontinued.  Recommendations;  CBC with am lab. Monitor patient for tenesmus or rectal pain when he is having a bowel movement. Patient will be reevaluated in a.m.   LOS: 3 days   Aidden Markovic U  05/21/2014, 5:12 PM

## 2014-05-21 NOTE — Progress Notes (Signed)
Subjective: Interval History: Patient feels better but feels week after physical therapy this morning. Denies any difficulty in breathing  Objective: Vital signs in last 24 hours: Temp:  [97.7 F (36.5 C)-98.1 F (36.7 C)] 97.7 F (36.5 C) (09/25 0636) Pulse Rate:  [70-86] 86 (09/25 0636) Resp:  [13-18] 13 (09/25 0636) BP: (112-134)/(37-66) 134/37 mmHg (09/25 0636) SpO2:  [98 %-100 %] 98 % (09/25 0636) Weight change:   Intake/Output from previous day: 09/24 0701 - 09/25 0700 In: 1840.4 [P.O.:480; I.V.:1360.4] Out: 400 [Urine:400] Intake/Output this shift: Total I/O In: 240 [P.O.:240] Out: -   General appearance: alert, cooperative and no distress Resp: diminished breath sounds posterior - bilateral Cardio: regular rate and rhythm, S1, S2 normal, no murmur, click, rub or gallop GI: soft, non-tender; bowel sounds normal; no masses,  no organomegaly Extremities: edema 2+ edema  Lab Results:  Recent Labs  05/18/14 1149 05/19/14 0626  WBC 4.3 3.9*  HGB 9.6* 8.9*  HCT 30.9* 28.8*  PLT 146* 140*   BMET:   Recent Labs  05/20/14 0639 05/21/14 0606  NA 140 140  K 4.3 4.1  CL 104 104  CO2 21 20  GLUCOSE 178* 166*  BUN 64* 64*  CREATININE 2.41* 2.41*  CALCIUM 8.5 8.4   No results found for this basename: PTH,  in the last 72 hours Iron Studies: No results found for this basename: IRON, TIBC, TRANSFERRIN, FERRITIN,  in the last 72 hours  Studies/Results: No results found.  I have reviewed the patient's current medications.  Assessment/Plan: Problem #1 acute kidney injury superimposed on chronic. Etiology for his acute kidney injury was thought to be secondary to ATN/ACE inhibitor. Presently his BUN and creatinine is stable and no change from yesterday.. Problem #2 history of chronic renal failure: Possibly stage III Problem #3 coronary artery disease: A. status post CABG. Presently he doesn't have any chest pain. Problem #4 history of CHF: Presently is a  symptomatic. His started on Lasix but still with bilateral edema Problem #5 history of CLL Problem #6 history of hypertension: His blood pressure is reasonably controlled Problem #7 anemia : His hemoglobin is low and declining Plan: D/C ivf We'll increase Lasix to 100 mg IV twice a day We'll check his basic metabolic panel and phosphorus in the morning.   LOS: 3 days   Courage Biglow S 05/21/2014,10:26 AM

## 2014-05-22 LAB — BASIC METABOLIC PANEL
ANION GAP: 14 (ref 5–15)
BUN: 63 mg/dL — ABNORMAL HIGH (ref 6–23)
CO2: 24 meq/L (ref 19–32)
CREATININE: 2.4 mg/dL — AB (ref 0.50–1.35)
Calcium: 8.7 mg/dL (ref 8.4–10.5)
Chloride: 103 mEq/L (ref 96–112)
GFR calc Af Amer: 26 mL/min — ABNORMAL LOW (ref >90)
GFR calc non Af Amer: 22 mL/min — ABNORMAL LOW (ref >90)
Glucose, Bld: 175 mg/dL — ABNORMAL HIGH (ref 70–99)
Potassium: 3.9 mEq/L (ref 3.7–5.3)
SODIUM: 141 meq/L (ref 137–147)

## 2014-05-22 LAB — CBC
HCT: 31.3 % — ABNORMAL LOW (ref 39.0–52.0)
Hemoglobin: 9.7 g/dL — ABNORMAL LOW (ref 13.0–17.0)
MCH: 22.6 pg — ABNORMAL LOW (ref 26.0–34.0)
MCHC: 31 g/dL (ref 30.0–36.0)
MCV: 73 fL — AB (ref 78.0–100.0)
PLATELETS: 141 10*3/uL — AB (ref 150–400)
RBC: 4.29 MIL/uL (ref 4.22–5.81)
RDW: 17.2 % — ABNORMAL HIGH (ref 11.5–15.5)
WBC: 4.7 10*3/uL (ref 4.0–10.5)

## 2014-05-22 MED ORDER — TORSEMIDE 20 MG PO TABS
50.0000 mg | ORAL_TABLET | Freq: Every day | ORAL | Status: DC
  Administered 2014-05-23 – 2014-05-24 (×2): 50 mg via ORAL
  Filled 2014-05-22 (×2): qty 3

## 2014-05-22 NOTE — Progress Notes (Signed)
Subjective: Interval History: Patient progressively improving and he offers no complaint  Objective: Vital signs in last 24 hours: Temp:  [97.7 F (36.5 C)-98.2 F (36.8 C)] 98.2 F (36.8 C) (09/26 0644) Pulse Rate:  [75-92] 75 (09/26 0644) Resp:  [20] 20 (09/26 0644) BP: (121-133)/(34-43) 128/34 mmHg (09/26 0644) SpO2:  [95 %-100 %] 97 % (09/26 0644) Weight:  [85.276 kg (188 lb)-85.412 kg (188 lb 4.8 oz)] 85.412 kg (188 lb 4.8 oz) (09/26 0644) Weight change:   Intake/Output from previous day: 09/25 0701 - 09/26 0700 In: 480 [P.O.:480] Out: -  Intake/Output this shift: Total I/O In: 120 [P.O.:120] Out: -   General appearance: alert, cooperative and no distress Resp: diminished breath sounds posterior - bilateral Cardio: regular rate and rhythm, S1, S2 normal, no murmur, click, rub or gallop GI: soft, non-tender; bowel sounds normal; no masses,  no organomegaly Extremities: edema 2+ edema  Lab Results:  Recent Labs  05/22/14 0724  WBC 4.7  HGB 9.7*  HCT 31.3*  PLT 141*   BMET:   Recent Labs  05/21/14 0606 05/22/14 0724  NA 140 141  K 4.1 3.9  CL 104 103  CO2 20 24  GLUCOSE 166* 175*  BUN 64* 63*  CREATININE 2.41* 2.40*  CALCIUM 8.4 8.7   No results found for this basename: PTH,  in the last 72 hours Iron Studies: No results found for this basename: IRON, TIBC, TRANSFERRIN, FERRITIN,  in the last 72 hours  Studies/Results: No results found.  I have reviewed the patient's current medications.  Assessment/Plan: Problem #1 acute kidney injury superimposed on chronic. Etiology for his acute kidney injury was thought to be secondary to ATN/ACE inhibitor. Presently his renal function is stable with normal potassium Problem #2 history of chronic renal failure: Possibly stage III Problem #3 coronary artery disease: A. status post CABG. Presently he doesn't have any chest pain. Problem #4 history of CHF: Patient on iv lasix how ever his out put is not  documented. Patient denies any difficulty in breathing but still has significant leg edema Problem #5 history of CLL Problem #6 history of hypertension: His blood pressure is reasonably controlled Problem #7 anemia : His hemoglobin is low and declining Plan: D/Civ Lasix Start on Demadex 50 mg po once a day We'll check his basic metabolic panel  in the morning.   LOS: 4 days   [name] S 05/22/2014,10:40 AM

## 2014-05-22 NOTE — Progress Notes (Signed)
  Subjective: Patient denies abdominal or rectal pain. Patient's daughter poor she states that he had normal bowel movement and did not complain of pain. He has good appetite. She feels he is getting stronger the way he is moving about in the room.   Objective: Blood pressure 128/34, pulse 75, temperature 98.2 F (36.8 C), temperature source Oral, resp. rate 20, height 5\' 8"  (1.727 m), weight 188 lb 4.8 oz (85.412 kg), SpO2 97.00%. Patient is alert and appears to be comfortable sitting in a recliner Abdomen is full but soft and without tenderness today. 2-3+ pitting edema involving both legs.  Labs/studies Results:   Recent Labs  05/22/14 0724  WBC 4.7  HGB 9.7*  HCT 31.3*  PLT 141*    BMET   Recent Labs  05/20/14 0639 05/21/14 0606 05/22/14 0724  NA 140 140 141  K 4.3 4.1 3.9  CL 104 104 103  CO2 21 20 24   GLUCOSE 178* 166* 175*  BUN 64* 64* 63*  CREATININE 2.41* 2.41* 2.40*  CALCIUM 8.5 8.4 8.7     Assessment:  #1. Rectal wall thickening on CT. Patient has no symptoms Pertaining to rectum. His stool was guaiac negative and he is not having rectal bleeding or discharge. Patient's daughter is agreeable to watch him rather than doing sigmoidoscopy. #2. Anemia of chronic disease. H&H is up. #3. CKD. Renal function is stable. #4. CHF.  Recommendations;  Will continue monitor for 4 rectal symptoms such as discharge or rectal bleeding or tenesmus. No plans for flexible sigmoidoscopy for now.

## 2014-05-22 NOTE — Progress Notes (Signed)
Pt stated that he had a BM today but did not have any pain.

## 2014-05-22 NOTE — Progress Notes (Signed)
Subjective: Patient is resting and feeling better. He was seen by Dr. Laural Golden and recommendation was done. Patient is also being seen by nephrologist and not much change in his renal function. His diuretics is changed. Objective: Vital signs in last 24 hours: Temp:  [97.7 F (36.5 C)-98.2 F (36.8 C)] 98.2 F (36.8 C) (09/26 0644) Pulse Rate:  [75-92] 75 (09/26 0644) Resp:  [20] 20 (09/26 0644) BP: (121-133)/(34-43) 128/34 mmHg (09/26 0644) SpO2:  [95 %-100 %] 97 % (09/26 0644) Weight:  [85.276 kg (188 lb)-85.412 kg (188 lb 4.8 oz)] 85.412 kg (188 lb 4.8 oz) (09/26 0644) Weight change:  Last BM Date: 05/21/14  Intake/Output from previous day: 09/25 0701 - 09/26 0700 In: 480 [P.O.:480] Out: -   PHYSICAL EXAM General appearance: fatigued and slowed mentation Resp: diminished breath sounds bilaterally and rhonchi bilaterally Cardio: S1, S2 normal GI: soft, non-tender; bowel sounds normal; no masses,  no organomegaly Extremities: edema 2++ leg edema  Lab Results:  Results for orders placed during the hospital encounter of 05/18/14 (from the past 48 hour(s))  BASIC METABOLIC PANEL     Status: Abnormal   Collection Time    05/21/14  6:06 AM      Result Value Ref Range   Sodium 140  137 - 147 mEq/L   Potassium 4.1  3.7 - 5.3 mEq/L   Chloride 104  96 - 112 mEq/L   CO2 20  19 - 32 mEq/L   Glucose, Bld 166 (*) 70 - 99 mg/dL   BUN 64 (*) 6 - 23 mg/dL   Creatinine, Ser 2.41 (*) 0.50 - 1.35 mg/dL   Calcium 8.4  8.4 - 10.5 mg/dL   GFR calc non Af Amer 22 (*) >90 mL/min   GFR calc Af Amer 26 (*) >90 mL/min   Comment: (NOTE)     The eGFR has been calculated using the CKD EPI equation.     This calculation has not been validated in all clinical situations.     eGFR's persistently <90 mL/min signify possible Chronic Kidney     Disease.   Anion gap 16 (*) 5 - 15  CBC     Status: Abnormal   Collection Time    05/22/14  7:24 AM      Result Value Ref Range   WBC 4.7  4.0 - 10.5 K/uL    RBC 4.29  4.22 - 5.81 MIL/uL   Hemoglobin 9.7 (*) 13.0 - 17.0 g/dL   HCT 31.3 (*) 39.0 - 52.0 %   MCV 73.0 (*) 78.0 - 100.0 fL   MCH 22.6 (*) 26.0 - 34.0 pg   MCHC 31.0  30.0 - 36.0 g/dL   RDW 17.2 (*) 11.5 - 15.5 %   Platelets 141 (*) 150 - 400 K/uL  BASIC METABOLIC PANEL     Status: Abnormal   Collection Time    05/22/14  7:24 AM      Result Value Ref Range   Sodium 141  137 - 147 mEq/L   Potassium 3.9  3.7 - 5.3 mEq/L   Chloride 103  96 - 112 mEq/L   CO2 24  19 - 32 mEq/L   Glucose, Bld 175 (*) 70 - 99 mg/dL   BUN 63 (*) 6 - 23 mg/dL   Creatinine, Ser 2.40 (*) 0.50 - 1.35 mg/dL   Calcium 8.7  8.4 - 10.5 mg/dL   GFR calc non Af Amer 22 (*) >90 mL/min   GFR calc Af Amer 26 (*) >  90 mL/min   Comment: (NOTE)     The eGFR has been calculated using the CKD EPI equation.     This calculation has not been validated in all clinical situations.     eGFR's persistently <90 mL/min signify possible Chronic Kidney     Disease.   Anion gap 14  5 - 15    ABGS No results found for this basename: PHART, PCO2, PO2ART, TCO2, HCO3,  in the last 72 hours CULTURES No results found for this or any previous visit (from the past 240 hour(s)). Studies/Results: No results found.  Medications: I have reviewed the patient's current medications.  Assesment: Active Problems:   Parkinson disease   Hypertension   Chronic lymphatic leukemia   Chronic kidney disease, stage 3   Weakness    Plan: Medications reviewed Continue IV fluid Will monitor BMP GI consult appreciated Continue physical therapy As nephrology recommendation   LOS: 4 days   Tarini Carrier 05/22/2014, 11:02 AM

## 2014-05-23 LAB — BASIC METABOLIC PANEL
ANION GAP: 11 (ref 5–15)
BUN: 59 mg/dL — ABNORMAL HIGH (ref 6–23)
CHLORIDE: 99 meq/L (ref 96–112)
CO2: 25 meq/L (ref 19–32)
Calcium: 8.4 mg/dL (ref 8.4–10.5)
Creatinine, Ser: 2.2 mg/dL — ABNORMAL HIGH (ref 0.50–1.35)
GFR calc Af Amer: 29 mL/min — ABNORMAL LOW (ref >90)
GFR calc non Af Amer: 25 mL/min — ABNORMAL LOW (ref >90)
Glucose, Bld: 168 mg/dL — ABNORMAL HIGH (ref 70–99)
Potassium: 3.7 mEq/L (ref 3.7–5.3)
SODIUM: 135 meq/L — AB (ref 137–147)

## 2014-05-23 NOTE — Progress Notes (Signed)
Patient has no complaints periods. According to his daughter portion he had formed stool today. His appetite is good. He denies abdominal or rectal pain. He also denies rectal bleeding or discharge. Assessment; Patient has no symptoms to suggest rectal disease. I would therefore recommend observation. Will sign off.

## 2014-05-23 NOTE — Progress Notes (Signed)
Subjective: Patient is resting. He feels better. His renal function is gradually improving. Objective: Vital signs in last 24 hours: Temp:  [98.2 F (36.8 C)-98.4 F (36.9 C)] 98.2 F (36.8 C) (09/27 0636) Pulse Rate:  [70-74] 74 (09/27 0636) Resp:  [18-20] 18 (09/27 0636) BP: (123-130)/(33-41) 128/41 mmHg (09/27 0636) SpO2:  [94 %-95 %] 95 % (09/27 0636) Weight:  [84.913 kg (187 lb 3.2 oz)] 84.913 kg (187 lb 3.2 oz) (09/27 0636) Weight change: -1.406 kg (-3 lb 1.6 oz) Last BM Date: 05/22/14  Intake/Output from previous day: 09/26 0701 - 09/27 0700 In: 360 [P.O.:360] Out: 265 [Urine:265]  PHYSICAL EXAM General appearance: fatigued and slowed mentation Resp: diminished breath sounds bilaterally and rhonchi bilaterally Cardio: S1, S2 normal GI: soft, non-tender; bowel sounds normal; no masses,  no organomegaly Extremities: edema 2++ leg edema  Lab Results:  Results for orders placed during the hospital encounter of 05/18/14 (from the past 48 hour(s))  CBC     Status: Abnormal   Collection Time    05/22/14  7:24 AM      Result Value Ref Range   WBC 4.7  4.0 - 10.5 K/uL   RBC 4.29  4.22 - 5.81 MIL/uL   Hemoglobin 9.7 (*) 13.0 - 17.0 g/dL   HCT 31.3 (*) 39.0 - 52.0 %   MCV 73.0 (*) 78.0 - 100.0 fL   MCH 22.6 (*) 26.0 - 34.0 pg   MCHC 31.0  30.0 - 36.0 g/dL   RDW 17.2 (*) 11.5 - 15.5 %   Platelets 141 (*) 150 - 400 K/uL  BASIC METABOLIC PANEL     Status: Abnormal   Collection Time    05/22/14  7:24 AM      Result Value Ref Range   Sodium 141  137 - 147 mEq/L   Potassium 3.9  3.7 - 5.3 mEq/L   Chloride 103  96 - 112 mEq/L   CO2 24  19 - 32 mEq/L   Glucose, Bld 175 (*) 70 - 99 mg/dL   BUN 63 (*) 6 - 23 mg/dL   Creatinine, Ser 2.40 (*) 0.50 - 1.35 mg/dL   Calcium 8.7  8.4 - 10.5 mg/dL   GFR calc non Af Amer 22 (*) >90 mL/min   GFR calc Af Amer 26 (*) >90 mL/min   Comment: (NOTE)     The eGFR has been calculated using the CKD EPI equation.     This calculation has  not been validated in all clinical situations.     eGFR's persistently <90 mL/min signify possible Chronic Kidney     Disease.   Anion gap 14  5 - 15  BASIC METABOLIC PANEL     Status: Abnormal   Collection Time    05/23/14  6:11 AM      Result Value Ref Range   Sodium 135 (*) 137 - 147 mEq/L   Potassium 3.7  3.7 - 5.3 mEq/L   Chloride 99  96 - 112 mEq/L   CO2 25  19 - 32 mEq/L   Glucose, Bld 168 (*) 70 - 99 mg/dL   BUN 59 (*) 6 - 23 mg/dL   Creatinine, Ser 2.20 (*) 0.50 - 1.35 mg/dL   Calcium 8.4  8.4 - 10.5 mg/dL   GFR calc non Af Amer 25 (*) >90 mL/min   GFR calc Af Amer 29 (*) >90 mL/min   Comment: (NOTE)     The eGFR has been calculated using the CKD EPI equation.  This calculation has not been validated in all clinical situations.     eGFR's persistently <90 mL/min signify possible Chronic Kidney     Disease.   Anion gap 11  5 - 15    ABGS No results found for this basename: PHART, PCO2, PO2ART, TCO2, HCO3,  in the last 72 hours CULTURES No results found for this or any previous visit (from the past 240 hour(s)). Studies/Results: No results found.  Medications: I have reviewed the patient's current medications.  Assesment: Active Problems:   Parkinson disease   Hypertension   Chronic lymphatic leukemia   Chronic kidney disease, stage 3   Weakness    Plan: Medications reviewed Continue IV fluid Will monitor BMPy As nephrology recommendation   LOS: 5 days   Taniqua Issa 05/23/2014, 10:18 AM

## 2014-05-23 NOTE — Progress Notes (Signed)
Subjective: Interval History: Patient offers no complaint.  Objective: Vital signs in last 24 hours: Temp:  [98.2 F (36.8 C)-98.4 F (36.9 C)] 98.2 F (36.8 C) (09/27 0636) Pulse Rate:  [70-74] 74 (09/27 0636) Resp:  [18-20] 18 (09/27 0636) BP: (123-130)/(33-41) 128/41 mmHg (09/27 0636) SpO2:  [94 %-95 %] 95 % (09/27 0636) Weight:  [84.913 kg (187 lb 3.2 oz)] 84.913 kg (187 lb 3.2 oz) (09/27 0636) Weight change: -1.406 kg (-3 lb 1.6 oz)  Intake/Output from previous day: 09/26 0701 - 09/27 0700 In: 360 [P.O.:360] Out: 265 [Urine:265] Intake/Output this shift:    General appearance: alert, cooperative and no distress Resp: diminished breath sounds posterior - bilateral Cardio: regular rate and rhythm, S1, S2 normal, no murmur, click, rub or gallop GI: soft, non-tender; bowel sounds normal; no masses,  no organomegaly Extremities: edema 2+ edema  Lab Results:  Recent Labs  05/22/14 0724  WBC 4.7  HGB 9.7*  HCT 31.3*  PLT 141*   BMET:   Recent Labs  05/22/14 0724 05/23/14 0611  NA 141 135*  K 3.9 3.7  CL 103 99  CO2 24 25  GLUCOSE 175* 168*  BUN 63* 59*  CREATININE 2.40* 2.20*  CALCIUM 8.7 8.4   No results found for this basename: PTH,  in the last 72 hours Iron Studies: No results found for this basename: IRON, TIBC, TRANSFERRIN, FERRITIN,  in the last 72 hours  Studies/Results: No results found.  I have reviewed the patient's current medications.  Assessment/Plan: Problem #1 acute kidney injury superimposed on chronic. His renal function is better Problem #2 history of chronic renal failure: Possibly stage III Problem #3 coronary artery disease: Asympomatic Problem #4 history of CHF: Patient on iv lasix how ever his out put is not documented. Still with edema Problem #5 history of CLL Problem #6 history of hypertension: His blood pressure is reasonably controlled Problem #7 anemia : His hemoglobin is low  Plan: Continue with present  treatment We'll check his basic metabolic panel  in the morning.   LOS: 5 days   Jenny Lai S 05/23/2014,10:34 AM

## 2014-05-24 DIAGNOSIS — I872 Venous insufficiency (chronic) (peripheral): Secondary | ICD-10-CM | POA: Diagnosis present

## 2014-05-24 LAB — BASIC METABOLIC PANEL
ANION GAP: 12 (ref 5–15)
BUN: 53 mg/dL — ABNORMAL HIGH (ref 6–23)
CALCIUM: 8.7 mg/dL (ref 8.4–10.5)
CO2: 29 mEq/L (ref 19–32)
Chloride: 98 mEq/L (ref 96–112)
Creatinine, Ser: 2.04 mg/dL — ABNORMAL HIGH (ref 0.50–1.35)
GFR calc Af Amer: 31 mL/min — ABNORMAL LOW (ref >90)
GFR calc non Af Amer: 27 mL/min — ABNORMAL LOW (ref >90)
Glucose, Bld: 159 mg/dL — ABNORMAL HIGH (ref 70–99)
POTASSIUM: 3.5 meq/L — AB (ref 3.7–5.3)
Sodium: 139 mEq/L (ref 137–147)

## 2014-05-24 NOTE — Progress Notes (Signed)
Subjective: He says he feels better. He's been up and around and wants to go home. He has not had his basic metabolic profile this morning  Objective: Vital signs in last 24 hours: Temp:  [98.1 F (36.7 C)-98.3 F (36.8 C)] 98.1 F (36.7 C) (09/28 0643) Pulse Rate:  [71-76] 76 (09/28 0643) Resp:  [18] 18 (09/28 0643) BP: (117-127)/(32-41) 117/32 mmHg (09/28 0643) SpO2:  [94 %-100 %] 100 % (09/28 0643) Weight:  [82.1 kg (181 lb)] 82.1 kg (181 lb) (09/28 0643) Weight change: -2.813 kg (-6 lb 3.2 oz) Last BM Date: 05/23/14  Intake/Output from previous day: 09/27 0701 - 09/28 0700 In: 480 [P.O.:480] Out: 200 [Urine:200]  PHYSICAL EXAM General appearance: alert, cooperative and no distress Resp: clear to auscultation bilaterally Cardio: irregularly irregular rhythm GI: soft, non-tender; bowel sounds normal; no masses,  no organomegaly Extremities: 1+ edema and chronic venous stasis  Lab Results:  Results for orders placed during the hospital encounter of 05/18/14 (from the past 48 hour(s))  BASIC METABOLIC PANEL     Status: Abnormal   Collection Time    05/23/14  6:11 AM      Result Value Ref Range   Sodium 135 (*) 137 - 147 mEq/L   Potassium 3.7  3.7 - 5.3 mEq/L   Chloride 99  96 - 112 mEq/L   CO2 25  19 - 32 mEq/L   Glucose, Bld 168 (*) 70 - 99 mg/dL   BUN 59 (*) 6 - 23 mg/dL   Creatinine, Ser 2.20 (*) 0.50 - 1.35 mg/dL   Calcium 8.4  8.4 - 10.5 mg/dL   GFR calc non Af Amer 25 (*) >90 mL/min   GFR calc Af Amer 29 (*) >90 mL/min   Comment: (NOTE)     The eGFR has been calculated using the CKD EPI equation.     This calculation has not been validated in all clinical situations.     eGFR's persistently <90 mL/min signify possible Chronic Kidney     Disease.   Anion gap 11  5 - 15    ABGS No results found for this basename: PHART, PCO2, PO2ART, TCO2, HCO3,  in the last 72 hours CULTURES No results found for this or any previous visit (from the past 240  hour(s)). Studies/Results: No results found.  Medications:  Prior to Admission:  Prescriptions prior to admission  Medication Sig Dispense Refill  . apixaban (ELIQUIS) 2.5 MG TABS tablet Take 1 tablet (2.5 mg total) by mouth 2 (two) times daily.  60 tablet  6  . atorvastatin (LIPITOR) 40 MG tablet Take 1 tablet by mouth daily.      . carbidopa-levodopa (SINEMET) 25-100 MG per tablet Take 1 tablet by mouth 2 (two) times daily.       . chlorthalidone (HYGROTON) 25 MG tablet TAKE 1/2 TABLET BY MOUTH EVERY DAY  45 tablet  0  . COMBIGAN 0.2-0.5 % ophthalmic solution Place 1 drop into both eyes daily.       Marland Kitchen donepezil (ARICEPT) 10 MG tablet Take 10 mg by mouth at bedtime.       . furosemide (LASIX) 40 MG tablet Take 40 mg on Tuesdays, Thursdays, & Saturdays      . ipratropium (ATROVENT) 0.06 % nasal spray Place 1 spray into both nostrils 4 (four) times daily.       Marland Kitchen lisinopril-hydrochlorothiazide (PRINZIDE,ZESTORETIC) 20-12.5 MG per tablet Take 1 tablet by mouth daily.       Scheduled: . apixaban  2.5 mg Oral BID  . atorvastatin  40 mg Oral q1800  . brimonidine  1 drop Both Eyes Daily   And  . timolol  1 drop Both Eyes Daily  . carbidopa-levodopa  1 tablet Oral BID  . donepezil  10 mg Oral QHS  . ipratropium  1 spray Each Nare QID  . torsemide  50 mg Oral Daily   Continuous:  KCX:[id number] (ZOFRAN) IV, ondansetron  Assesment: He was admitted with acute on chronic renal failure, weakness and is known to have multiple other medical problems including Parkinson disease hypertension chronic lymphocytic leukemia coronary artery occlusive disease and a history of congestive heart failure. He has peripheral edema that does not seem to be related to CHF but more to venous insufficiency Active Problems:   Parkinson disease   Hypertension   Chronic lymphatic leukemia   Chronic kidney disease, stage 3   Weakness    Plan: He can potentially go home today if his basic metabolic profile  looks okay    LOS: 6 days   Chukwuma Straus L 05/24/2014, 8:47 AM

## 2014-05-24 NOTE — Progress Notes (Signed)
Physical Therapy Treatment Patient Details Name: [name] MRN: [id number] DOB: [birth year] Today's Date: 05/24/2014    History of Present Illness This is a 80 year old man, who has mild to moderate dementia and Parkinson disease, now presents because he has been feeling weak for the last couple of days. He also appears to have some left upper quadrant abdominal pain without any vomiting or nausea. There is no diarrhea. He has a history of chronic large granular lymphocytic disorder. This is being followed with watchful expectation by the oncology team.    PT Comments    Pt was seen for gait training and demonstrated increased strength with transfers and improved gait stability/endurance.  His safety at home is much better today.  I again discussed with his daughter the need for constant supervision at home.  Follow Up Recommendations  Home health PT     Equipment Recommendations  None recommended by PT    Recommendations for Other Services  none     Precautions / Restrictions Precautions Precautions: Fall Restrictions Weight Bearing Restrictions: No    Mobility  Bed Mobility                  Transfers Overall transfer level: Needs assistance Equipment used: Rolling walker (2 wheeled)   Sit to Stand: Supervision         General transfer comment: transfer from sit to stand was much less labored for pt and he was quickly able to transfer his weight anteriorly for transfer  Ambulation/Gait Ambulation/Gait assistance: Min guard Ambulation Distance (Feet): 50 Feet Assistive device: Rolling walker (2 wheeled) Gait Pattern/deviations: Trunk flexed   Gait velocity interpretation: Below normal speed for age/gender General Gait Details: gait less labored today with increased endurance.     Stairs            Wheelchair Mobility    Modified Rankin (Stroke Patients Only)       Balance                                    Cognition  Arousal/Alertness: Awake/alert Behavior During Therapy: WFL for tasks assessed/performed Overall Cognitive Status: Within Functional Limits for tasks assessed                      Exercises      General Comments        Pertinent Vitals/Pain Pain Assessment: No/denies pain    Home Living                      Prior Function            PT Goals (current goals can now be found in the care plan section) Progress towards PT goals: PT to reassess next treatment (currentl goals met)    Frequency  Min 3X/week    PT Plan Current plan remains appropriate    Co-evaluation             End of Session Equipment Utilized During Treatment: Gait belt Activity Tolerance: Patient tolerated treatment well Patient left: in chair;with call bell/phone within reach;with chair alarm set;with family/visitor present     Time: 6063-0160 PT Time Calculation (min): 16 min  Charges:  $Gait Training: 8-22 mins                    G Codes:      Demetrios Isaacs  L 05/24/2014, 10:55 AM

## 2014-05-24 NOTE — Progress Notes (Signed)
Patient discharged with instructions, prescription, and care notes.  Pt/daughter Verbalized understanding via teach back.  IV was removed and the site was WNL. Patient voiced no further complaints or concerns at the time of discharge.  Appointments scheduled per instructions.  Patient left the floor via w/c with staff and family in stable condition. Appt. Dr. Lowanda Foster set.

## 2014-05-24 NOTE — Progress Notes (Signed)
Subjective: Interval History: Patient appetite is getting better and no nausea or vomiting. Denies any difficulty in breathing  Objective: Vital signs in last 24 hours: Temp:  [98.1 F (36.7 C)-98.3 F (36.8 C)] 98.1 F (36.7 C) (09/28 0643) Pulse Rate:  [71-76] 76 (09/28 0643) Resp:  [18] 18 (09/28 0643) BP: (117-127)/(32-41) 117/32 mmHg (09/28 0643) SpO2:  [94 %-100 %] 100 % (09/28 0643) Weight:  [82.1 kg (181 lb)] 82.1 kg (181 lb) (09/28 0643) Weight change: -2.813 kg (-6 lb 3.2 oz)  Intake/Output from previous day: 09/27 0701 - 09/28 0700 In: 480 [P.O.:480] Out: 200 [Urine:200] Intake/Output this shift:    General appearance: alert, cooperative and no distress Resp: diminished breath sounds posterior - bilateral Cardio: regular rate and rhythm, S1, S2 normal, no murmur, click, rub or gallop GI: soft, non-tender; bowel sounds normal; no masses,  no organomegaly Extremities: edema 2+ edema  Lab Results:  Recent Labs  05/22/14 0724  WBC 4.7  HGB 9.7*  HCT 31.3*  PLT 141*   BMET:   Recent Labs  05/22/14 0724 05/23/14 0611  NA 141 135*  K 3.9 3.7  CL 103 99  CO2 24 25  GLUCOSE 175* 168*  BUN 63* 59*  CREATININE 2.40* 2.20*  CALCIUM 8.7 8.4   No results found for this basename: PTH,  in the last 72 hours Iron Studies: No results found for this basename: IRON, TIBC, TRANSFERRIN, FERRITIN,  in the last 72 hours  Studies/Results: No results found.  I have reviewed the patient's current medications.  Assessment/Plan: Problem #1 acute kidney injury superimposed on chronic. His renal function continue to improve Problem #2 history of chronic renal failure: Possibly stage III Problem #3 coronary artery disease: Asympomatic Problem #4 history of CHF: Patient on iv lasix how ever his out put is not documented. According to his families patient has been to going to the bath room to multiple time to urinate but unable to save it. Patient wight is also declining .  Came down to roughly 85 kg to 82 kg at present time Problem #5 history of CLL Problem #6 history of hypertension: His blood pressure is reasonably controlled Problem #7 anemia : His hemoglobin is low  Plan: Continue with present treatment If he is going to be discharged I will see him in 2 weeks as out patient We'll check his basic metabolic panel  in the morning.   LOS: 6 days   Shenise Wolgamott S 05/24/2014,8:16 AM

## 2014-05-24 NOTE — Progress Notes (Signed)
Notified Dr. Luan Pulling about the patients BMP results.  Voiced to him in particular BUN 53 and creatinine 2.04.  MD verbalized understanding and voiced he would place orders for discharge.

## 2014-05-25 NOTE — Discharge Summary (Signed)
Physician Discharge Summary  Patient ID: [name] MRN: [id number] DOB/AGE: 1921/01/11 80 y.o. Primary Care Physician:Dafne Nield L, MD Admit date: 05/18/2014 Discharge date: 05/25/2014    Discharge Diagnoses:   Active Problems:   ATHEROSCLEROTIC CARDIOVASCULAR DISEASE   Parkinson disease   Hypertension   Chronic lymphatic leukemia   Chronic kidney disease, stage 3   Atrial fibrillation   Aortic insufficiency with aortic stenosis   Weakness   Chronic venous insufficiency     Medication List         apixaban 2.5 MG Tabs tablet  Commonly known as:  ELIQUIS  Take 1 tablet (2.5 mg total) by mouth 2 (two) times daily.     atorvastatin 40 MG tablet  Commonly known as:  LIPITOR  Take 1 tablet by mouth daily.     carbidopa-levodopa 25-100 MG per tablet  Commonly known as:  SINEMET IR  Take 1 tablet by mouth 2 (two) times daily.     chlorthalidone 25 MG tablet  Commonly known as:  HYGROTON  TAKE 1/2 TABLET BY MOUTH EVERY DAY     COMBIGAN 0.2-0.5 % ophthalmic solution  Generic drug:  brimonidine-timolol  Place 1 drop into both eyes daily.     donepezil 10 MG tablet  Commonly known as:  ARICEPT  Take 10 mg by mouth at bedtime.     furosemide 40 MG tablet  Commonly known as:  LASIX  Take 40 mg on Tuesdays, Thursdays, & Saturdays     ipratropium 0.06 % nasal spray  Commonly known as:  ATROVENT  Place 1 spray into both nostrils 4 (four) times daily.     lisinopril-hydrochlorothiazide 20-12.5 MG per tablet  Commonly known as:  PRINZIDE,ZESTORETIC  Take 1 tablet by mouth daily.        Discharged Condition: Improved    Consults: Gastroenterology/nephrology  Significant Diagnostic Studies: Ct Abdomen Pelvis Wo Contrast  05/18/2014   CLINICAL DATA:  Abdominal pain.  EXAM: CT ABDOMEN AND PELVIS WITHOUT CONTRAST  TECHNIQUE: Multidetector CT imaging of the abdomen and pelvis was performed following the standard protocol without IV contrast.  COMPARISON:   August 05, 2012.  FINDINGS: Multilevel degenerative disc disease is noted in the lumbar spine. Mild bilateral pleural effusions are noted with right greater than left.  No gallstones are noted. Stable hepatic cysts are the spleen and pancreas appear normal. Adrenal glands appear normal. No hydronephrosis or renal obstruction is noted. No renal or ureteral calculi are noted. Atherosclerotic calcifications of abdominal aorta are noted without aneurysm formation. The appendix appears normal. There is no evidence of bowel obstruction. Urinary bladder appears normal. Mild bilateral fat containing inguinal hernias are noted. No abnormal fluid collection is noted. Diverticulosis of descending and sigmoid colon is noted without inflammation. No significant adenopathy is noted. Mild inflammatory changes are seen in the soft tissues posterior to the rectum suggesting possible inflammation. Mild wall thickening of the rectum is noted. Stool is noted in the rectum.  IMPRESSION: Diverticulosis of descending and sigmoid colon is noted without inflammation.  Mild bilateral pleural effusions are noted.  Mild wall thickening of the rectum is noted with surrounding inflammatory changes suggesting inflammation of this area.   Electronically Signed   By: [name]   On: 05/18/2014 13:50   Dg Chest Portable 1 View  05/18/2014   CLINICAL DATA:  Abdominal pain and weakness.  EXAM: PORTABLE CHEST - 1 VIEW  COMPARISON:  08/05/2012  FINDINGS: New cardiomegaly. Pulmonary vascularity is at the upper limits  of normal. Dual lead pacemaker in place. There is loss of the silhouette pole of the left hemidiaphragm which is probably due to left base atelectasis. Right lung is clear. CABG. No acute osseous abnormality.  IMPRESSION: New cardiomegaly.  Slight left base atelectasis.   Electronically Signed   By: Rozetta Nunnery M.D.   On: 05/18/2014 11:59    Lab Results: Basic Metabolic Panel:  Recent Labs  05/23/14 0611 05/24/14 0850   NA 135* 139  K 3.7 3.5*  CL 99 98  CO2 25 29  GLUCOSE 168* 159*  BUN 59* 53*  CREATININE 2.20* 2.04*  CALCIUM 8.4 8.7   Liver Function Tests: No results found for this basename: AST, ALT, ALKPHOS, BILITOT, PROT, ALBUMIN,  in the last 72 hours   CBC: No results found for this basename: WBC, NEUTROABS, HGB, HCT, MCV, PLT,  in the last 72 hours  No results found for this or any previous visit (from the past 240 hour(s)).   Hospital Course: This is a 80 year old who came to the emergency department because of weakness. He has been having trouble with irritation in his rectum. He was also noted to have acute on chronic renal failure. He appeared to be dehydrated. He was treated with IV fluids and improved. His renal function improved and at the time of discharge his creatinine was approximately. He had edema and was treated with torsemide. He was back to baseline at the time of discharge.  Discharge Exam: Blood pressure 117/32, pulse 76, temperature 98.1 F (36.7 C), temperature source Oral, resp. rate 18, height 5\' 8"  (1.727 m), weight 82.1 kg (181 lb), SpO2 100.00%. He is awake and alert. He still has some edema of his extremities but that is chronic. His chest is clear. His heart is regular.  Disposition: Home with home health services      Discharge Instructions   Face-to-face encounter (required for Medicare/Medicaid patients)    Complete by:  As directed   I Melbourne Jakubiak L certify that this patient is under my care and that I, or a nurse practitioner or physician's assistant working with me, had a face-to-face encounter that meets the physician face-to-face encounter requirements with this patient on 05/24/2014. The encounter with the patient was in whole, or in part for the following medical condition(s) which is the primary reason for home health care (List medical condition): weakness/renal failure  The encounter with the patient was in whole, or in part, for the following  medical condition, which is the primary reason for home health care:  weakness/renal failure  I certify that, based on my findings, the following services are medically necessary home health services:   Nursing Physical therapy    My clinical findings support the need for the above services:  Unable to leave home safely without assistance and/or assistive device  Further, I certify that my clinical findings support that this patient is homebound due to:  Ambulates short distances less than 300 feet  Reason for Medically Necessary Home Health Services:  Skilled Nursing- Change/Decline in Patient Status     Home Health    Complete by:  As directed   To provide the following care/treatments:   PT RN             Follow-up Information   Follow up with [facility]. (they will call you)    Contact information:   [address] [place] [address] [phone]       Follow  up with [facility] S, MD In 3 weeks.   Specialty:  Nephrology   Contact information:   [address] [address] [phone]       Follow up with [facility].   Contact information:   [place] [address] [phone]       Signed: [name]   05/25/2014, 8:53 AM

## 2014-05-25 NOTE — Progress Notes (Signed)
UR chart review completed.  

## 2014-05-31 ENCOUNTER — Emergency Department (HOSPITAL_COMMUNITY)
Admission: EM | Admit: 2014-05-31 | Discharge: 2014-05-31 | Disposition: A | Discharge status: Home / Self Care | Payer: Medicare Other | Source: Home / Self Care | Attending: Emergency Medicine | Admitting: Emergency Medicine

## 2014-05-31 ENCOUNTER — Other Ambulatory Visit: Payer: Self-pay

## 2014-05-31 ENCOUNTER — Encounter (HOSPITAL_COMMUNITY): Payer: Self-pay | Admitting: Emergency Medicine

## 2014-05-31 ENCOUNTER — Emergency Department (HOSPITAL_COMMUNITY): Payer: Medicare Other

## 2014-05-31 DIAGNOSIS — Z87891 Personal history of nicotine dependence: Secondary | ICD-10-CM

## 2014-05-31 DIAGNOSIS — Z856 Personal history of leukemia: Secondary | ICD-10-CM

## 2014-05-31 DIAGNOSIS — Z8669 Personal history of other diseases of the nervous system and sense organs: Secondary | ICD-10-CM | POA: Insufficient documentation

## 2014-05-31 DIAGNOSIS — Z8673 Personal history of transient ischemic attack (TIA), and cerebral infarction without residual deficits: Secondary | ICD-10-CM | POA: Insufficient documentation

## 2014-05-31 DIAGNOSIS — I10 Essential (primary) hypertension: Secondary | ICD-10-CM | POA: Insufficient documentation

## 2014-05-31 DIAGNOSIS — F0280 Dementia in other diseases classified elsewhere without behavioral disturbance: Secondary | ICD-10-CM | POA: Insufficient documentation

## 2014-05-31 DIAGNOSIS — R739 Hyperglycemia, unspecified: Secondary | ICD-10-CM | POA: Insufficient documentation

## 2014-05-31 DIAGNOSIS — N179 Acute kidney failure, unspecified: Secondary | ICD-10-CM | POA: Diagnosis not present

## 2014-05-31 DIAGNOSIS — G20 Parkinson's disease: Secondary | ICD-10-CM

## 2014-05-31 DIAGNOSIS — I2510 Atherosclerotic heart disease of native coronary artery without angina pectoris: Secondary | ICD-10-CM

## 2014-05-31 DIAGNOSIS — Z950 Presence of cardiac pacemaker: Secondary | ICD-10-CM

## 2014-05-31 DIAGNOSIS — N289 Disorder of kidney and ureter, unspecified: Secondary | ICD-10-CM | POA: Insufficient documentation

## 2014-05-31 DIAGNOSIS — I739 Peripheral vascular disease, unspecified: Secondary | ICD-10-CM

## 2014-05-31 DIAGNOSIS — R531 Weakness: Secondary | ICD-10-CM | POA: Diagnosis not present

## 2014-05-31 DIAGNOSIS — Z951 Presence of aortocoronary bypass graft: Secondary | ICD-10-CM

## 2014-05-31 DIAGNOSIS — Z8719 Personal history of other diseases of the digestive system: Secondary | ICD-10-CM | POA: Insufficient documentation

## 2014-05-31 DIAGNOSIS — Z79899 Other long term (current) drug therapy: Secondary | ICD-10-CM | POA: Insufficient documentation

## 2014-05-31 DIAGNOSIS — Z7901 Long term (current) use of anticoagulants: Secondary | ICD-10-CM | POA: Insufficient documentation

## 2014-05-31 LAB — CBC WITH DIFFERENTIAL/PLATELET
BASOS ABS: 0 10*3/uL (ref 0.0–0.1)
Basophils Relative: 1 % (ref 0–1)
Eosinophils Absolute: 0 10*3/uL (ref 0.0–0.7)
Eosinophils Relative: 1 % (ref 0–5)
HCT: 34 % — ABNORMAL LOW (ref 39.0–52.0)
Hemoglobin: 10 g/dL — ABNORMAL LOW (ref 13.0–17.0)
Lymphocytes Relative: 43 % (ref 12–46)
Lymphs Abs: 1.5 10*3/uL (ref 0.7–4.0)
MCH: 21.4 pg — ABNORMAL LOW (ref 26.0–34.0)
MCHC: 29.4 g/dL — ABNORMAL LOW (ref 30.0–36.0)
MCV: 72.8 fL — ABNORMAL LOW (ref 78.0–100.0)
Monocytes Absolute: 0.3 10*3/uL (ref 0.1–1.0)
Monocytes Relative: 10 % (ref 3–12)
NEUTROS PCT: 46 % (ref 43–77)
Neutro Abs: 1.6 10*3/uL — ABNORMAL LOW (ref 1.7–7.7)
Platelets: 80 10*3/uL — ABNORMAL LOW (ref 150–400)
RBC: 4.67 MIL/uL (ref 4.22–5.81)
RDW: 18.1 % — AB (ref 11.5–15.5)
WBC: 3.4 10*3/uL — ABNORMAL LOW (ref 4.0–10.5)

## 2014-05-31 LAB — BASIC METABOLIC PANEL
ANION GAP: 16 — AB (ref 5–15)
BUN: 74 mg/dL — ABNORMAL HIGH (ref 6–23)
CALCIUM: 8.9 mg/dL (ref 8.4–10.5)
CO2: 33 meq/L — AB (ref 19–32)
Chloride: 92 mEq/L — ABNORMAL LOW (ref 96–112)
Creatinine, Ser: 2.81 mg/dL — ABNORMAL HIGH (ref 0.50–1.35)
GFR calc Af Amer: 21 mL/min — ABNORMAL LOW (ref >90)
GFR calc non Af Amer: 18 mL/min — ABNORMAL LOW (ref >90)
Glucose, Bld: 248 mg/dL — ABNORMAL HIGH (ref 70–99)
POTASSIUM: 3.1 meq/L — AB (ref 3.7–5.3)
SODIUM: 141 meq/L (ref 137–147)

## 2014-05-31 LAB — URINALYSIS, ROUTINE W REFLEX MICROSCOPIC
BILIRUBIN URINE: NEGATIVE
Glucose, UA: NEGATIVE mg/dL
Hgb urine dipstick: NEGATIVE
Ketones, ur: NEGATIVE mg/dL
Leukocytes, UA: NEGATIVE
NITRITE: NEGATIVE
Protein, ur: NEGATIVE mg/dL
SPECIFIC GRAVITY, URINE: 1.01 (ref 1.005–1.030)
UROBILINOGEN UA: 0.2 mg/dL (ref 0.0–1.0)
pH: 6 (ref 5.0–8.0)

## 2014-05-31 LAB — TROPONIN I

## 2014-05-31 MED ORDER — SODIUM CHLORIDE 0.9 % IV BOLUS (SEPSIS)
500.0000 mL | Freq: Once | INTRAVENOUS | Status: AC
  Administered 2014-05-31: 500 mL via INTRAVENOUS

## 2014-05-31 NOTE — Discharge Instructions (Signed)
Followup your primary care Dr.

## 2014-05-31 NOTE — ED Provider Notes (Signed)
CSN: [id number]     Arrival date & time 05/31/14  1114 History  This chart was scribed for Nat Christen, MD by Rayfield Citizen, ED Scribe. This patient was seen in room [facility] and the patient's care was started at 11:45 AM.   **Level Five Caveat; weakness  Chief Complaint  Patient presents with  . Weakness   The history is provided by a relative. No language interpreter was used.    HPI Comments: [name] is a 80 y.o. male who presents to the Emergency Department complaining of generalized weakness. Patient's daughter notes patient's increased lethargy and sleepiness, as well as increased thirst. Patient was discharged one week prior from [facility]; his home nurse came by this morning and noted that his vitals were lower than normal. Daughter explains that he is fine mentally (slight short term memory loss present) but that he simply nods off into sleep on occasion. She notes that his breathing is normal, and that his appetite, while decreased, is still within his normal range.   Daughter explains that he was recently diagnosed with stage 3 renal disease. His doctor limited his fluids due to his renal issues but increased his diuretics. His urinary output is normal, though his frequency of urination has decreased. He also has a history of sleep apnea.   Patient regularly attends adult daycare 8:30 to 3:00PM. His regular caregivers are his two daughters. He normally ambulates with a cane, and can bathe and move in and out of the tub by himself.   Past Medical History  Diagnosis Date  . Arteriosclerotic cardiovascular disease (ASCVD)     CABG & bioprosthetic aortic valve replace in 1999. PCI of right coronary in 2000, catheter in 07/2002 revealed total occlusion of the LAD; LIMA-LAD patent; patent SVG-OM1, SVG-diagonal ramus, PDA occluded; patent Cx stent, distal 30-40% stenosis Cx; nondom RCA, prox RCA 20-30% lesion, distal RCA 90% lesion; normal EF  . TIA (transient ischemic attack)   .  Hypertension     normal CMet and 2011  . Hyperlipidemia     Lipid profile in 12/2009-170, 73, 79, 76  . PVD (peripheral vascular disease)   . Parkinson disease 2010    mild; + dementia  . History of sinus bradycardia     induced by beta blocker  . History of syncope   . Chronic lymphatic leukemia     mild pancytopenia in 02/2010;hemoglobin-12.9, WBC-3.3, platelets-109  . Hepatitis   . Glaucoma 09/1994  . Tobacco abuse, in remission     20 pack years; discontinued 50 years ago  . Insomnia     Middle of the night awakening  . Coronary artery disease   . Dysrhythmia     Bradycardia  . Shortness of breath   . GERD (gastroesophageal reflux disease)   . Dementia    Past Surgical History  Procedure Laterality Date  . Retinal detachment surgery  1989  . Tonsillectomy    . Transurethral resection of prostate      Benign prostatic hypertrophy  . Coronary artery bypass graft  1999    + Bioprosthetic AVR  . Colonoscopy  2011  . A-v cardiac pacemaker insertion  02/2012    [employer] Accent DR RF   Family History  Problem Relation Age of Onset  . Pneumonia Father   . Cancer Brother   . Breast cancer Sister   . Diabetes Sister     complications   History  Substance Use Topics  . Smoking status: Former Research scientist (life sciences)  .  Smokeless tobacco: Never Used  . Alcohol Use: No     Comment: rare    Review of Systems  A complete 10 system review of systems was obtained and all systems are negative except as noted in the HPI and PMH.   Allergies  Namenda; Shellfish allergy; and Ambien  Home Medications   Prior to Admission medications   Medication Sig Start Date End Date Taking? Authorizing Provider  apixaban (ELIQUIS) 2.5 MG TABS tablet Take 1 tablet (2.5 mg total) by mouth 2 (two) times daily. 10/28/13  Yes Evans Lance, MD  atorvastatin (LIPITOR) 40 MG tablet Take 1 tablet by mouth daily. 03/18/14  Yes Historical Provider, MD  carbidopa-levodopa (SINEMET) 25-100 MG per tablet Take 1 tablet  by mouth 2 (two) times daily.  02/08/11  Yes Historical Provider, MD  chlorthalidone (HYGROTON) 25 MG tablet TAKE 1/2 TABLET BY MOUTH EVERY DAY 05/17/14  Yes Evans Lance, MD  COMBIGAN 0.2-0.5 % ophthalmic solution Place 1 drop into both eyes daily.  08/09/11  Yes Historical Provider, MD  donepezil (ARICEPT) 10 MG tablet Take 10 mg by mouth at bedtime.  02/23/11  Yes Historical Provider, MD  ipratropium (ATROVENT) 0.06 % nasal spray Place 1 spray into both nostrils 4 (four) times daily.  12/11/12  Yes Historical Provider, MD  lisinopril-hydrochlorothiazide (PRINZIDE,ZESTORETIC) 20-12.5 MG per tablet Take 1 tablet by mouth daily.   Yes Historical Provider, MD  torsemide (DEMADEX) 100 MG tablet Take 50 mg by mouth daily.   Yes Historical Provider, MD   BP 124/39  Pulse 65  Temp(Src) 96 F (35.6 C) (Rectal)  Resp 20  Ht 5\' 3"  (1.6 m)  Wt 183 lb (83.008 kg)  BMI 32.43 kg/m2  SpO2 98% Physical Exam  Nursing note and vitals reviewed. Constitutional: He appears well-developed and well-nourished.  Weak appearing, listless, somnolent.  Aroused by questioning.   HENT:  Head: Normocephalic and atraumatic.  Eyes: Conjunctivae and EOM are normal. Pupils are equal, round, and reactive to light.  Neck: Normal range of motion. Neck supple.  Cardiovascular: Normal rate, regular rhythm and normal heart sounds.   Pulmonary/Chest: Effort normal and breath sounds normal.  Abdominal: Soft. Bowel sounds are normal.  Musculoskeletal: Normal range of motion.  Unable  Neurological:  Unable; answered questions only vaguely  Skin: Skin is warm and dry.  Psychiatric:  Flat affect    ED Course  Procedures  DIAGNOSTIC STUDIES: Oxygen Saturation is 98% which is normal by my interpretation   COORDINATION OF CARE: 3:37 PM Discussed treatment plan with pt at bedside and pt agreed to plan.  Labs Review Labs Reviewed  CBC WITH DIFFERENTIAL - Abnormal; Notable for the following:    WBC 3.4 (*)     Hemoglobin 10.0 (*)    HCT 34.0 (*)    MCV 72.8 (*)    MCH 21.4 (*)    MCHC 29.4 (*)    RDW 18.1 (*)    Platelets 80 (*)    Neutro Abs 1.6 (*)    All other components within normal limits  BASIC METABOLIC PANEL - Abnormal; Notable for the following:    Potassium 3.1 (*)    Chloride 92 (*)    CO2 33 (*)    Glucose, Bld 248 (*)    BUN 74 (*)    Creatinine, Ser 2.81 (*)    GFR calc non Af Amer 18 (*)    GFR calc Af Amer 21 (*)    Anion gap 16 (*)  All other components within normal limits  TROPONIN I  URINALYSIS, ROUTINE W REFLEX MICROSCOPIC    Imaging Review Dg Chest Port 1 View  05/31/2014   CLINICAL DATA:  Weakness.  EXAM: PORTABLE CHEST - 1 VIEW  COMPARISON:  05/18/2014  FINDINGS: Pacemaker in place. Heart size and pulmonary vascularity are normal and the lungs are clear. CABG. No acute osseous abnormality. No effusions.  IMPRESSION: No acute disease.   Electronically Signed   By: Rozetta Nunnery M.D.   On: 05/31/2014 13:01     EKG Interpretation   Date/Time:  Monday May 31 2014 11:29:00 EDT Ventricular Rate:  69 PR Interval:  230 QRS Duration: 125 QT Interval:  482 QTC Calculation: 516 R Axis:   -35 Text Interpretation:  Sinus rhythm Prolonged PR interval Probable left  atrial enlargement LVH with secondary repolarization abnormality Prolonged  QT interval Confirmed by Lacinda Axon  MD, Zoe Goonan ([id number]) on 05/31/2014 12:11:50 PM      MDM   Final diagnoses:  Renal insufficiency  Hyperglycemia   Renal function remains approximately the same. Urine specific gravity indicates no severe dehydration. This was discussed with the daughter. She understands acute intervention via inpatient hospitalization would not be fruitful. Patient will followup with primary care Dr.  I personally performed the services described in this documentation, which was scribed in my presence. The recorded information has been reviewed and is accurate.      Nat Christen, MD 05/31/14 1539

## 2014-05-31 NOTE — ED Notes (Signed)
Recently discharged from hospital on Mon with dx of Stage 3-4 kidney failure and weakness per EMS. Family called EMS today d/t pt's increased weakness.

## 2014-06-02 ENCOUNTER — Inpatient Hospital Stay (HOSPITAL_COMMUNITY): Payer: Medicare Other

## 2014-06-02 ENCOUNTER — Encounter (HOSPITAL_COMMUNITY): Payer: Self-pay | Admitting: Emergency Medicine

## 2014-06-02 ENCOUNTER — Inpatient Hospital Stay (HOSPITAL_COMMUNITY)
Admission: EM | Admit: 2014-06-02 | Discharge: 2014-06-05 | DRG: 683 | Disposition: A | Discharge status: Home Health | Payer: Medicare Other | Attending: Pulmonary Disease | Admitting: Pulmonary Disease

## 2014-06-02 ENCOUNTER — Emergency Department (HOSPITAL_COMMUNITY): Payer: Medicare Other

## 2014-06-02 DIAGNOSIS — K219 Gastro-esophageal reflux disease without esophagitis: Secondary | ICD-10-CM | POA: Diagnosis present

## 2014-06-02 DIAGNOSIS — N400 Enlarged prostate without lower urinary tract symptoms: Secondary | ICD-10-CM | POA: Diagnosis present

## 2014-06-02 DIAGNOSIS — E785 Hyperlipidemia, unspecified: Secondary | ICD-10-CM | POA: Diagnosis present

## 2014-06-02 DIAGNOSIS — R569 Unspecified convulsions: Secondary | ICD-10-CM | POA: Diagnosis present

## 2014-06-02 DIAGNOSIS — R531 Weakness: Secondary | ICD-10-CM | POA: Diagnosis present

## 2014-06-02 DIAGNOSIS — N183 Chronic kidney disease, stage 3 unspecified: Secondary | ICD-10-CM

## 2014-06-02 DIAGNOSIS — Z91013 Allergy to seafood: Secondary | ICD-10-CM | POA: Diagnosis not present

## 2014-06-02 DIAGNOSIS — I2510 Atherosclerotic heart disease of native coronary artery without angina pectoris: Secondary | ICD-10-CM | POA: Diagnosis present

## 2014-06-02 DIAGNOSIS — N179 Acute kidney failure, unspecified: Principal | ICD-10-CM | POA: Diagnosis present

## 2014-06-02 DIAGNOSIS — Z950 Presence of cardiac pacemaker: Secondary | ICD-10-CM

## 2014-06-02 DIAGNOSIS — H409 Unspecified glaucoma: Secondary | ICD-10-CM | POA: Diagnosis present

## 2014-06-02 DIAGNOSIS — Z951 Presence of aortocoronary bypass graft: Secondary | ICD-10-CM

## 2014-06-02 DIAGNOSIS — Z87891 Personal history of nicotine dependence: Secondary | ICD-10-CM | POA: Diagnosis not present

## 2014-06-02 DIAGNOSIS — R52 Pain, unspecified: Secondary | ICD-10-CM

## 2014-06-02 DIAGNOSIS — Z953 Presence of xenogenic heart valve: Secondary | ICD-10-CM

## 2014-06-02 DIAGNOSIS — E860 Dehydration: Secondary | ICD-10-CM | POA: Diagnosis present

## 2014-06-02 DIAGNOSIS — G20 Parkinson's disease: Secondary | ICD-10-CM | POA: Diagnosis present

## 2014-06-02 DIAGNOSIS — T501X5A Adverse effect of loop [high-ceiling] diuretics, initial encounter: Secondary | ICD-10-CM | POA: Diagnosis present

## 2014-06-02 DIAGNOSIS — E876 Hypokalemia: Secondary | ICD-10-CM | POA: Diagnosis present

## 2014-06-02 DIAGNOSIS — I352 Nonrheumatic aortic (valve) stenosis with insufficiency: Secondary | ICD-10-CM | POA: Diagnosis present

## 2014-06-02 DIAGNOSIS — I739 Peripheral vascular disease, unspecified: Secondary | ICD-10-CM | POA: Diagnosis present

## 2014-06-02 DIAGNOSIS — F0280 Dementia in other diseases classified elsewhere without behavioral disturbance: Secondary | ICD-10-CM | POA: Diagnosis present

## 2014-06-02 DIAGNOSIS — Z833 Family history of diabetes mellitus: Secondary | ICD-10-CM

## 2014-06-02 DIAGNOSIS — C9110 Chronic lymphocytic leukemia of B-cell type not having achieved remission: Secondary | ICD-10-CM

## 2014-06-02 DIAGNOSIS — F0390 Unspecified dementia without behavioral disturbance: Secondary | ICD-10-CM

## 2014-06-02 DIAGNOSIS — Z803 Family history of malignant neoplasm of breast: Secondary | ICD-10-CM

## 2014-06-02 DIAGNOSIS — N184 Chronic kidney disease, stage 4 (severe): Secondary | ICD-10-CM

## 2014-06-02 DIAGNOSIS — I4891 Unspecified atrial fibrillation: Secondary | ICD-10-CM | POA: Diagnosis present

## 2014-06-02 DIAGNOSIS — D696 Thrombocytopenia, unspecified: Secondary | ICD-10-CM | POA: Diagnosis present

## 2014-06-02 DIAGNOSIS — I509 Heart failure, unspecified: Secondary | ICD-10-CM | POA: Diagnosis present

## 2014-06-02 DIAGNOSIS — I872 Venous insufficiency (chronic) (peripheral): Secondary | ICD-10-CM

## 2014-06-02 DIAGNOSIS — Z8673 Personal history of transient ischemic attack (TIA), and cerebral infarction without residual deficits: Secondary | ICD-10-CM | POA: Diagnosis not present

## 2014-06-02 DIAGNOSIS — I129 Hypertensive chronic kidney disease with stage 1 through stage 4 chronic kidney disease, or unspecified chronic kidney disease: Secondary | ICD-10-CM | POA: Diagnosis present

## 2014-06-02 LAB — CBC WITH DIFFERENTIAL/PLATELET
Basophils Absolute: 0 10*3/uL (ref 0.0–0.1)
Basophils Relative: 0 % (ref 0–1)
EOS ABS: 0 10*3/uL (ref 0.0–0.7)
Eosinophils Relative: 2 % (ref 0–5)
HCT: 32.9 % — ABNORMAL LOW (ref 39.0–52.0)
Hemoglobin: 9.9 g/dL — ABNORMAL LOW (ref 13.0–17.0)
Lymphocytes Relative: 34 % (ref 12–46)
Lymphs Abs: 0.9 10*3/uL (ref 0.7–4.0)
MCH: 21.7 pg — AB (ref 26.0–34.0)
MCHC: 30.1 g/dL (ref 30.0–36.0)
MCV: 72.1 fL — ABNORMAL LOW (ref 78.0–100.0)
Monocytes Absolute: 0.2 10*3/uL (ref 0.1–1.0)
Monocytes Relative: 9 % (ref 3–12)
NEUTROS PCT: 55 % (ref 43–77)
Neutro Abs: 1.5 10*3/uL — ABNORMAL LOW (ref 1.7–7.7)
PLATELETS: 85 10*3/uL — AB (ref 150–400)
RBC: 4.56 MIL/uL (ref 4.22–5.81)
RDW: 18 % — ABNORMAL HIGH (ref 11.5–15.5)
WBC: 2.7 10*3/uL — ABNORMAL LOW (ref 4.0–10.5)

## 2014-06-02 LAB — COMPREHENSIVE METABOLIC PANEL
ALK PHOS: 132 U/L — AB (ref 39–117)
ALT: 15 U/L (ref 0–53)
AST: 18 U/L (ref 0–37)
Albumin: 3.2 g/dL — ABNORMAL LOW (ref 3.5–5.2)
Anion gap: 14 (ref 5–15)
BUN: 83 mg/dL — ABNORMAL HIGH (ref 6–23)
CO2: 34 mEq/L — ABNORMAL HIGH (ref 19–32)
Calcium: 8.8 mg/dL (ref 8.4–10.5)
Chloride: 91 mEq/L — ABNORMAL LOW (ref 96–112)
Creatinine, Ser: 3.14 mg/dL — ABNORMAL HIGH (ref 0.50–1.35)
GFR calc Af Amer: 19 mL/min — ABNORMAL LOW (ref >90)
GFR calc non Af Amer: 16 mL/min — ABNORMAL LOW (ref >90)
Glucose, Bld: 198 mg/dL — ABNORMAL HIGH (ref 70–99)
Potassium: 3.2 mEq/L — ABNORMAL LOW (ref 3.7–5.3)
SODIUM: 139 meq/L (ref 137–147)
TOTAL PROTEIN: 6.5 g/dL (ref 6.0–8.3)
Total Bilirubin: 0.8 mg/dL (ref 0.3–1.2)

## 2014-06-02 LAB — TROPONIN I: Troponin I: <0.3 ng/mL (ref <0.30)

## 2014-06-02 MED ORDER — POTASSIUM CHLORIDE CRYS ER 20 MEQ PO TBCR
40.0000 meq | EXTENDED_RELEASE_TABLET | Freq: Once | ORAL | Status: AC
  Administered 2014-06-02: 40 meq via ORAL
  Filled 2014-06-02: qty 2

## 2014-06-02 MED ORDER — TIMOLOL MALEATE 0.5 % OP SOLN
1.0000 [drp] | Freq: Every day | OPHTHALMIC | Status: DC
  Administered 2014-06-02 – 2014-06-05 (×4): 1 [drp] via OPHTHALMIC
  Filled 2014-06-02: qty 5

## 2014-06-02 MED ORDER — SODIUM CHLORIDE 0.9 % IV BOLUS (SEPSIS)
500.0000 mL | Freq: Once | INTRAVENOUS | Status: AC
  Administered 2014-06-02: 500 mL via INTRAVENOUS

## 2014-06-02 MED ORDER — IPRATROPIUM BROMIDE 0.03 % NA SOLN
1.0000 | Freq: Four times a day (QID) | NASAL | Status: DC
  Filled 2014-06-02: qty 15

## 2014-06-02 MED ORDER — ONDANSETRON HCL 4 MG/2ML IJ SOLN: 4.0000 mg | Freq: Four times a day (QID) | INTRAMUSCULAR | Status: DC | PRN

## 2014-06-02 MED ORDER — SODIUM CHLORIDE 0.9 % IJ SOLN
3.0000 mL | Freq: Two times a day (BID) | INTRAMUSCULAR | Status: DC
  Administered 2014-06-02 – 2014-06-05 (×4): 3 mL via INTRAVENOUS

## 2014-06-02 MED ORDER — BRIMONIDINE TARTRATE-TIMOLOL 0.2-0.5 % OP SOLN: 1.0000 [drp] | Freq: Every day | OPHTHALMIC | Status: DC

## 2014-06-02 MED ORDER — DONEPEZIL HCL 5 MG PO TABS
10.0000 mg | ORAL_TABLET | Freq: Every day | ORAL | Status: DC
  Administered 2014-06-02 – 2014-06-04 (×3): 10 mg via ORAL
  Filled 2014-06-02 (×3): qty 2

## 2014-06-02 MED ORDER — ONDANSETRON HCL 4 MG PO TABS: 4.0000 mg | ORAL_TABLET | Freq: Four times a day (QID) | ORAL | Status: DC | PRN

## 2014-06-02 MED ORDER — APIXABAN 5 MG PO TABS: 2.5000 mg | ORAL_TABLET | Freq: Two times a day (BID) | ORAL | Status: DC

## 2014-06-02 MED ORDER — POTASSIUM CHLORIDE IN NACL 20-0.9 MEQ/L-% IV SOLN
INTRAVENOUS | Status: DC
  Administered 2014-06-02 – 2014-06-03 (×4): via INTRAVENOUS
  Administered 2014-06-04: 10 mL/h via INTRAVENOUS

## 2014-06-02 MED ORDER — BRIMONIDINE TARTRATE 0.2 % OP SOLN
1.0000 [drp] | Freq: Three times a day (TID) | OPHTHALMIC | Status: DC
  Administered 2014-06-02 – 2014-06-05 (×5): 1 [drp] via OPHTHALMIC
  Filled 2014-06-02: qty 5

## 2014-06-02 MED ORDER — SODIUM CHLORIDE 0.9 % IV BOLUS (SEPSIS): 500.0000 mL | Freq: Once | INTRAVENOUS | Status: DC

## 2014-06-02 MED ORDER — CARBIDOPA-LEVODOPA 25-100 MG PO TABS
1.0000 | ORAL_TABLET | Freq: Two times a day (BID) | ORAL | Status: DC
  Administered 2014-06-02 – 2014-06-05 (×6): 1 via ORAL
  Filled 2014-06-02 (×6): qty 1

## 2014-06-02 MED ORDER — ACETAMINOPHEN 325 MG PO TABS: 650.0000 mg | ORAL_TABLET | Freq: Four times a day (QID) | ORAL | Status: DC | PRN

## 2014-06-02 MED ORDER — ACETAMINOPHEN 650 MG RE SUPP: 650.0000 mg | Freq: Four times a day (QID) | RECTAL | Status: DC | PRN

## 2014-06-02 MED ORDER — ATORVASTATIN CALCIUM 40 MG PO TABS
40.0000 mg | ORAL_TABLET | Freq: Every day | ORAL | Status: DC
  Administered 2014-06-02 – 2014-06-05 (×4): 40 mg via ORAL
  Filled 2014-06-02 (×4): qty 1

## 2014-06-02 MED ORDER — BRIMONIDINE TARTRATE 0.2 % OP SOLN
OPHTHALMIC | Status: AC
  Filled 2014-06-02: qty 5

## 2014-06-02 MED ORDER — SODIUM CHLORIDE 0.9 % IV SOLN
Freq: Once | INTRAVENOUS | Status: AC
  Administered 2014-06-02: 500 mL via INTRAVENOUS

## 2014-06-02 NOTE — ED Notes (Signed)
Patient tried to urinate and was unsuccessful and Attempted to do an In and Out and no urine return.

## 2014-06-02 NOTE — ED Provider Notes (Signed)
CSN: [id number]     Arrival date & time 06/02/14  0940 History  This chart was scribed for Nat Christen, MD by Molli Posey, ED Scribe. This patient was seen in room APA04/APA04 and the patient's care was started 10:00 AM.    Chief Complaint  Patient presents with  . Seizures   LEVEL 5 CAVEAT - urgent need for intervention  The history is provided by the patient, the EMS personnel and a relative. No language interpreter was used.   HPI Comments: [name] is a 80 y.o. male who presents to the Emergency Department complaining of "2 seizures" that occurred around 9:15AM. The daughter reports hearing the patient fall in the bathroom while sitting on the commode.  When she entered the bathroom, she denies he was having a seizure. Per EMS, the patient was found between commode and bathtub.  Patient reports he feels generalized weakness and states "he doesn't feel good" currently. No cp, sob, fever, chills, dysuria, stiff neck   Past Medical History  Diagnosis Date  . Arteriosclerotic cardiovascular disease (ASCVD)     CABG & bioprosthetic aortic valve replace in 1999. PCI of right coronary in 2000, catheter in 07/2002 revealed total occlusion of the LAD; LIMA-LAD patent; patent SVG-OM1, SVG-diagonal ramus, PDA occluded; patent Cx stent, distal 30-40% stenosis Cx; nondom RCA, prox RCA 20-30% lesion, distal RCA 90% lesion; normal EF  . TIA (transient ischemic attack)   . Hypertension     normal CMet and 2011  . Hyperlipidemia     Lipid profile in 12/2009-170, 73, 79, 76  . PVD (peripheral vascular disease)   . Parkinson disease 2010    mild; + dementia  . History of sinus bradycardia     induced by beta blocker  . History of syncope   . Chronic lymphatic leukemia     mild pancytopenia in 02/2010;hemoglobin-12.9, WBC-3.3, platelets-109  . Hepatitis   . Glaucoma 09/1994  . Tobacco abuse, in remission     20 pack years; discontinued 50 years ago  . Insomnia     Middle of the night  awakening  . Coronary artery disease   . Dysrhythmia     Bradycardia  . Shortness of breath   . GERD (gastroesophageal reflux disease)   . Dementia    Past Surgical History  Procedure Laterality Date  . Retinal detachment surgery  1989  . Tonsillectomy    . Transurethral resection of prostate      Benign prostatic hypertrophy  . Coronary artery bypass graft  1999    + Bioprosthetic AVR  . Colonoscopy  2011  . A-v cardiac pacemaker insertion  02/2012    St[employer] Jude Accent DR RF   Family History  Problem Relation Age of Onset  . Pneumonia Father   . Cancer Brother   . Breast cancer Sister   . Diabetes Sister     complications   History  Substance Use Topics  . Smoking status: Former Research scientist (life sciences)  . Smokeless tobacco: Never Used  . Alcohol Use: No     Comment: rare    Review of Systems  Unable to perform ROS: Acuity of condition  Neurological: Positive for weakness.  All other systems reviewed and are negative.     Allergies  Namenda; Shellfish allergy; and Ambien  Home Medications   Prior to Admission medications   Medication Sig Start Date End Date Taking? Authorizing Provider  apixaban (ELIQUIS) 2.5 MG TABS tablet Take 1 tablet (2.5 mg total) by mouth  2 (two) times daily. 10/28/13  Yes Evans Lance, MD  atorvastatin (LIPITOR) 40 MG tablet Take 1 tablet by mouth daily. 03/18/14  Yes Historical Provider, MD  carbidopa-levodopa (SINEMET) 25-100 MG per tablet Take 1 tablet by mouth 2 (two) times daily.  02/08/11  Yes Historical Provider, MD  chlorthalidone (HYGROTON) 25 MG tablet Take 12.5 mg by mouth daily.   Yes Historical Provider, MD  COMBIGAN 0.2-0.5 % ophthalmic solution Place 1 drop into both eyes daily.  08/09/11  Yes Historical Provider, MD  donepezil (ARICEPT) 10 MG tablet Take 10 mg by mouth at bedtime.  02/23/11  Yes Historical Provider, MD  ipratropium (ATROVENT) 0.06 % nasal spray Place 1 spray into both nostrils 4 (four) times daily.  12/11/12  Yes Historical  Provider, MD  lisinopril-hydrochlorothiazide (PRINZIDE,ZESTORETIC) 20-12.5 MG per tablet Take 1 tablet by mouth daily.   Yes Historical Provider, MD  torsemide (DEMADEX) 100 MG tablet Take 50 mg by mouth daily.   Yes Historical Provider, MD   BP 127/38  Pulse 77  Temp(Src) 98 F (36.7 C) (Oral)  Resp 30  Ht 5\' 6"  (1.676 m)  Wt 180 lb (81.647 kg)  BMI 29.07 kg/m2  SpO2 98% Physical Exam  Nursing note and vitals reviewed. Constitutional: He is oriented to person, place, and time. He appears well-developed and well-nourished.  Slow to respond to questions but reasonably appropriate   HENT:  Head: Normocephalic and atraumatic.  Eyes: Conjunctivae and EOM are normal. Pupils are equal, round, and reactive to light.  Neck: Normal range of motion. Neck supple.  Cardiovascular: Normal rate, regular rhythm and normal heart sounds.   Pulmonary/Chest: Effort normal and breath sounds normal.  Abdominal: Soft. Bowel sounds are normal.  Musculoskeletal: Normal range of motion.  Neurological: He is alert and oriented to person, place, and time.  Moving all of his extremities, alert and oriented x2  Skin: Skin is warm and dry.  Psychiatric: He has a normal mood and affect. His behavior is normal.    ED Course  Procedures  DIAGNOSTIC STUDIES: Oxygen Saturation is 99% on RA, normal by my interpretation.    COORDINATION OF CARE: 10:10 AM Discussed treatment plan with pt at bedside and pt agreed to plan.   Labs Review Labs Reviewed  CBC WITH DIFFERENTIAL - Abnormal; Notable for the following:    WBC 2.7 (*)    Hemoglobin 9.9 (*)    HCT 32.9 (*)    MCV 72.1 (*)    MCH 21.7 (*)    RDW 18.0 (*)    Platelets 85 (*)    Neutro Abs 1.5 (*)    All other components within normal limits  COMPREHENSIVE METABOLIC PANEL - Abnormal; Notable for the following:    Potassium 3.2 (*)    Chloride 91 (*)    CO2 34 (*)    Glucose, Bld 198 (*)    BUN 83 (*)    Creatinine, Ser 3.14 (*)    Albumin 3.2  (*)    Alkaline Phosphatase 132 (*)    GFR calc non Af Amer 16 (*)    GFR calc Af Amer 19 (*)    All other components within normal limits  TROPONIN I  URINALYSIS, ROUTINE W REFLEX MICROSCOPIC    Imaging Review Ct Head Wo Contrast  06/02/2014   CLINICAL DATA:  Seizure activity with subsequent fall in the bathroom; now with generalized weakness; history of previous TIA, Parkinson's disease, as well as coronary artery disease; permanent pacemaker placement  EXAM: CT HEAD WITHOUT CONTRAST  TECHNIQUE: Contiguous axial images were obtained from the base of the skull through the vertex without intravenous contrast.  COMPARISON:  Noncontrast CT scan of the brain of July 06, 2009.  FINDINGS: There is mild diffuse cerebral and cerebellar atrophy. There is moderate ventriculomegaly. There is no intracranial hemorrhage nor intracranial mass effect. There is stable decreased density in the deep white matter of both cerebral hemispheres. There is an old lacunar infarction in the right cerebellar hemisphere. No acute ischemic changes are demonstrated.  There is mucoperiosteal thickening within left ethmoid sinus cells which is stable. There is a retention cyst or polyp in a right sphenoid sinus cell. The mastoid air cells are well pneumatized. There is no acute skull fracture. There is no significant cephalohematoma.  IMPRESSION: 1. There is no acute ischemic or hemorrhagic event within the brain. 2. There is moderate stable ventriculomegaly with mild diffuse atrophy. There are stable changes of chronic small vessel ischemia. There is an old lacunar infarction in the right cerebellar hemisphere. 3. There is no acute skull fracture.   Electronically Signed   By: David  Martinique   On: 06/02/2014 12:17   Dg Chest Port 1 View  06/02/2014   CLINICAL DATA:  Initial encounter for worsening weakness.  EXAM: PORTABLE CHEST - 1 VIEW  COMPARISON:  05/31/2014  FINDINGS: 1105 hrs. The lungs are clear without focal infiltrate,  edema, pneumothorax or pleural effusion. The cardio pericardial silhouette is enlarged. Left-sided dual lead permanent pacemaker remains in place. Imaged bony structures of the thorax are intact. Telemetry leads overlie the chest.  IMPRESSION: No acute cardiopulmonary findings.   Electronically Signed   By: Misty Stanley M.D.   On: 06/02/2014 11:30     EKG Interpretation None      MDM   Final diagnoses:  Weakness  Chronic kidney disease, stage 3  Second emergency department visit this week. Patient is unable to care for himself. CT head shows no acute changes.   Creatinine greater than 3.  Admit    I personally performed the services described in this documentation, which was scribed in my presence. The recorded information has been reviewed and is accurate.      Nat Christen, MD 06/05/14 [phone]

## 2014-06-02 NOTE — H&P (Signed)
[facility] History and Physical  [name] MOQ:[id number] DOB: 1921-08-02 DOA: 06/02/2014  Referring physician: Dr. Lacinda Axon, ER physician PCP: [name], MD   Chief Complaint: possible seizure  HPI: [name] is a 80 y.o. male who was recently discharged from the hospital after being treated for acute on chronic renal failure. After his discharge, patient came back to the emergency room for evaluation for weakness. He was discharged from the emergency room with plans for outpatient followup. Within 2 days, he came back to the emergency room for evaluation today. The patient has dementia and is unable to provide any reliable history. History is obtained from his daughter. She reports that he has been somewhat sleepy the past few days. Today, she noticed that he was sitting on the commode and had fallen over. He was still somewhat lethargic and therefore EMS was called. Upon their arrival, the patient's daughter felt that the patient may have had a seizure. She describes his eyes rolling in the back of his head and possible shaking movements. She is unsure whether he had any urinary or bowel incontinence. He did not have any tongue biting. He does not have a prior history of seizures. He did not appear to have any significant postictal period. He was evaluated in the emergency room where his renal function was noted to be worsening. He did appear to be mildly dehydrated. He is getting admitted for further evaluation.   Review of Systems:  Unable to assess due to dementia  Past Medical History  Diagnosis Date  . Arteriosclerotic cardiovascular disease (ASCVD)     CABG & bioprosthetic aortic valve replace in 1999. PCI of right coronary in 2000, catheter in 07/2002 revealed total occlusion of the LAD; LIMA-LAD patent; patent SVG-OM1, SVG-diagonal ramus, PDA occluded; patent Cx stent, distal 30-40% stenosis Cx; nondom RCA, prox RCA 20-30% lesion, distal RCA 90% lesion; normal  EF  . TIA (transient ischemic attack)   . Hypertension     normal CMet and 2011  . Hyperlipidemia     Lipid profile in 12/2009-170, 73, 79, 76  . PVD (peripheral vascular disease)   . Parkinson disease 2010    mild; + dementia  . History of sinus bradycardia     induced by beta blocker  . History of syncope   . Chronic lymphatic leukemia     mild pancytopenia in 02/2010;hemoglobin-12.9, WBC-3.3, platelets-109  . Hepatitis   . Glaucoma 09/1994  . Tobacco abuse, in remission     20 pack years; discontinued 50 years ago  . Insomnia     Middle of the night awakening  . Coronary artery disease   . Dysrhythmia     Bradycardia  . Shortness of breath   . GERD (gastroesophageal reflux disease)   . Dementia    Past Surgical History  Procedure Laterality Date  . Retinal detachment surgery  1989  . Tonsillectomy    . Transurethral resection of prostate      Benign prostatic hypertrophy  . Coronary artery bypass graft  1999    + Bioprosthetic AVR  . Colonoscopy  2011  . A-v cardiac pacemaker insertion  02/2012    St. Jude Accent DR RF   Social History:  reports that he has quit smoking. He has never used smokeless tobacco. He reports that he does not drink alcohol or use illicit drugs.  Allergies  Allergen Reactions  . Namenda [Memantine Hcl] Other (See Comments)    Altered mental status, agitation  .  Shellfish Allergy Anaphylaxis  . Ambien [Zolpidem Tartrate]     Family History  Problem Relation Age of Onset  . Pneumonia Father   . Cancer Brother   . Breast cancer Sister   . Diabetes Sister     complications     Prior to Admission medications   Medication Sig Start Date End Date Taking? Authorizing Provider  apixaban (ELIQUIS) 2.5 MG TABS tablet Take 1 tablet (2.5 mg total) by mouth 2 (two) times daily. 10/28/13  Yes Evans Lance, MD  atorvastatin (LIPITOR) 40 MG tablet Take 1 tablet by mouth daily. 03/18/14  Yes Historical Provider, MD  carbidopa-levodopa (SINEMET)  25-100 MG per tablet Take 1 tablet by mouth 2 (two) times daily.  02/08/11  Yes Historical Provider, MD  chlorthalidone (HYGROTON) 25 MG tablet Take 12.5 mg by mouth daily.   Yes Historical Provider, MD  COMBIGAN 0.2-0.5 % ophthalmic solution Place 1 drop into both eyes daily.  08/09/11  Yes Historical Provider, MD  donepezil (ARICEPT) 10 MG tablet Take 10 mg by mouth at bedtime.  02/23/11  Yes Historical Provider, MD  ipratropium (ATROVENT) 0.06 % nasal spray Place 1 spray into both nostrils 4 (four) times daily.  12/11/12  Yes Historical Provider, MD  lisinopril-hydrochlorothiazide (PRINZIDE,ZESTORETIC) 20-12.5 MG per tablet Take 1 tablet by mouth daily.   Yes Historical Provider, MD  torsemide (DEMADEX) 100 MG tablet Take 50 mg by mouth daily.   Yes Historical Provider, MD   Physical Exam: Filed Vitals:   06/02/14 1500 06/02/14 1545 06/02/14 1622 06/02/14 1647  BP: 133/93 135/44 136/41   Pulse:  70 80   Temp:   97.6 F (36.4 C)   TempSrc:   Oral   Resp: 20 24 18    Height:    5\' 6"  (1.676 m)  Weight:    73.437 kg (161 lb 14.4 oz)  SpO2:  96%      Wt Readings from Last 3 Encounters:  06/02/14 73.437 kg (161 lb 14.4 oz)  05/31/14 83.008 kg (183 lb)  05/24/14 82.1 kg (181 lb)    General:  Appears calm and comfortable Eyes: PERRL, normal lids, irises & conjunctiva ENT: grossly normal hearing, lips & tongue Neck: no LAD, masses or thyromegaly Cardiovascular:  irregular, no 1-4/4 systolic ejection murmur. No LE edema. Respiratory: CTA bilaterally, no w/r/r. Normal respiratory effort. Abdomen: soft, ntnd Skin: no rash or induration seen on limited exam Musculoskeletal: grossly normal tone BUE/BLE Psychiatric: grossly normal mood and affect, speech fluent and appropriate Neurologic: grossly non-focal.          Labs on Admission:  Basic Metabolic Panel:  Recent Labs Lab 05/31/14 1217 06/02/14 1047  NA 141 139  K 3.1* 3.2*  CL 92* 91*  CO2 33* 34*  GLUCOSE 248* 198*  BUN  74* 83*  CREATININE 2.81* 3.14*  CALCIUM 8.9 8.8   Liver Function Tests:  Recent Labs Lab 06/02/14 1047  AST 18  ALT 15  ALKPHOS 132*  BILITOT 0.8  PROT 6.5  ALBUMIN 3.2*   No results found for this basename: LIPASE, AMYLASE,  in the last 168 hours No results found for this basename: AMMONIA,  in the last 168 hours CBC:  Recent Labs Lab 05/31/14 1217 06/02/14 1047  WBC 3.4* 2.7*  NEUTROABS 1.6* 1.5*  HGB 10.0* 9.9*  HCT 34.0* 32.9*  MCV 72.8* 72.1*  PLT 80* 85*   Cardiac Enzymes:  Recent Labs Lab 05/31/14 1217 06/02/14 1047  TROPONINI <0.30 <0.30  BNP (last 3 results)  Recent Labs  05/18/14 1149  PROBNP 4996.0*   CBG: No results found for this basename: GLUCAP,  in the last 168 hours  Radiological Exams on Admission: Ct Head Wo Contrast  06/02/2014   CLINICAL DATA:  Seizure activity with subsequent fall in the bathroom; now with generalized weakness; history of previous TIA, Parkinson's disease, as well as coronary artery disease; permanent pacemaker placement  EXAM: CT HEAD WITHOUT CONTRAST  TECHNIQUE: Contiguous axial images were obtained from the base of the skull through the vertex without intravenous contrast.  COMPARISON:  Noncontrast CT scan of the brain of July 06, 2009.  FINDINGS: There is mild diffuse cerebral and cerebellar atrophy. There is moderate ventriculomegaly. There is no intracranial hemorrhage nor intracranial mass effect. There is stable decreased density in the deep white matter of both cerebral hemispheres. There is an old lacunar infarction in the right cerebellar hemisphere. No acute ischemic changes are demonstrated.  There is mucoperiosteal thickening within left ethmoid sinus cells which is stable. There is a retention cyst or polyp in a right sphenoid sinus cell. The mastoid air cells are well pneumatized. There is no acute skull fracture. There is no significant cephalohematoma.  IMPRESSION: 1. There is no acute ischemic or  hemorrhagic event within the brain. 2. There is moderate stable ventriculomegaly with mild diffuse atrophy. There are stable changes of chronic small vessel ischemia. There is an old lacunar infarction in the right cerebellar hemisphere. 3. There is no acute skull fracture.   Electronically Signed   By: David  Martinique   On: 06/02/2014 12:17   Dg Chest Port 1 View  06/02/2014   CLINICAL DATA:  Initial encounter for worsening weakness.  EXAM: PORTABLE CHEST - 1 VIEW  COMPARISON:  05/31/2014  FINDINGS: 1105 hrs. The lungs are clear without focal infiltrate, edema, pneumothorax or pleural effusion. The cardio pericardial silhouette is enlarged. Left-sided dual lead permanent pacemaker remains in place. Imaged bony structures of the thorax are intact. Telemetry leads overlie the chest.  IMPRESSION: No acute cardiopulmonary findings.   Electronically Signed   By: Misty Stanley M.D.   On: 06/02/2014 11:30   Dg Foot Complete Right  06/02/2014   CLINICAL DATA:  Initial encounter for Fall due to seizure today. Injury with pain in the great toe.  EXAM: RIGHT FOOT COMPLETE - 3+ VIEW  COMPARISON:  None.  FINDINGS: Bones are diffusely demineralized. Limited evaluation of the tarsometatarsal joints due to positioning. Degenerative changes are noted in the tibiotalar joint. There is also degenerative change in the MTP joint of the great toe. No acute fracture. No subluxation or dislocation. Vascular calcifications suggest diabetes.  IMPRESSION: No acute bony findings.   Electronically Signed   By: Misty Stanley M.D.   On: 06/02/2014 16:18    Assessment/Plan Active Problems:   Chronic lymphatic leukemia   CKD (chronic kidney disease) stage 4, GFR 15-29 ml/min   Atrial fibrillation   Aortic insufficiency with aortic stenosis   Weakness   Seizure   Dehydration   Dementia   1. Possible seizure. It is unclear whether the patient had a true seizure. He was monitored on telemetry. We'll check EEG. We'll hold off on any  antiepileptics at this point. 2. Mild dehydration. We'll start the patient on IV fluids. He was receiving torsemide, hydrochlorothiazide, chlorthalidone at home. We'll hold these agents for now.  3. Acute on chronic kidney disease stage IV. Possibly related to volume depletion. Will give a trial of  IV fluids and recheck labs in the morning. Monitor urine output. Hold ACE inhibitor for now 4. Atrial fibrillation. Continue current rate control regimen. The patient is also an Eliquis for anticoagulation, but with his significantly declining GFR, it may be suitable to prescribe another agent. We'll hold anticoagulation for today. Last that his primary care doctor followup tomorrow to determine if warfarin will be more appropriate. 5. Thrombocytopenia. Possibly related to underlying CLL. Check B12, TSH. 6. Generalized weakness. Possibly related to fundoplication. We'll recheck after IV hydration. Physical therapy consultation. 7. Hypokalemia. Replace. Likely related to diuretics. Check magnesium 8. Dementia. Continue Aricept   Code Status: full code  DVT Prophylaxis: SCDs Family Communication: discussed with daugther over the phone (indicate person spoken with, if applicable, with phone number if by telephone) Disposition Plan: discharge home once improved  Time spent: 40mins  Aveline Daus [facility] Pager [phone]

## 2014-06-02 NOTE — ED Notes (Signed)
Family request right great toe be x-ray. Toe is painful and swollen

## 2014-06-02 NOTE — ED Notes (Signed)
EMS called out for fall and when arrived sitting between commode and bathroom and had 2 witnessed seizures with EMS. Pt alert upon arrival.

## 2014-06-03 ENCOUNTER — Telehealth: Payer: Self-pay | Admitting: Cardiology

## 2014-06-03 ENCOUNTER — Encounter: Payer: Medicare Other | Admitting: *Deleted

## 2014-06-03 ENCOUNTER — Inpatient Hospital Stay (HOSPITAL_COMMUNITY)
Admit: 2014-06-03 | Discharge: 2014-06-03 | Disposition: A | Discharge status: Home / Self Care | Payer: Medicare Other | Source: Home / Self Care | Attending: Internal Medicine | Admitting: Internal Medicine

## 2014-06-03 LAB — URINALYSIS, ROUTINE W REFLEX MICROSCOPIC
BILIRUBIN URINE: NEGATIVE
Glucose, UA: NEGATIVE mg/dL
HGB URINE DIPSTICK: NEGATIVE
Ketones, ur: NEGATIVE mg/dL
Leukocytes, UA: NEGATIVE
Nitrite: NEGATIVE
Protein, ur: NEGATIVE mg/dL
Specific Gravity, Urine: <1.005 — ABNORMAL LOW (ref 1.005–1.030)
Urobilinogen, UA: 1 mg/dL (ref 0.0–1.0)
pH: 7.5 (ref 5.0–8.0)

## 2014-06-03 LAB — BASIC METABOLIC PANEL
ANION GAP: 11 (ref 5–15)
BUN: 70 mg/dL — ABNORMAL HIGH (ref 6–23)
CHLORIDE: 99 meq/L (ref 96–112)
CO2: 31 mEq/L (ref 19–32)
Calcium: 8.6 mg/dL (ref 8.4–10.5)
Creatinine, Ser: 2.32 mg/dL — ABNORMAL HIGH (ref 0.50–1.35)
GFR calc Af Amer: 27 mL/min — ABNORMAL LOW (ref >90)
GFR calc non Af Amer: 23 mL/min — ABNORMAL LOW (ref >90)
GLUCOSE: 173 mg/dL — AB (ref 70–99)
POTASSIUM: 3.9 meq/L (ref 3.7–5.3)
SODIUM: 141 meq/L (ref 137–147)

## 2014-06-03 LAB — CBC
HEMATOCRIT: 32.3 % — AB (ref 39.0–52.0)
HEMOGLOBIN: 9.5 g/dL — AB (ref 13.0–17.0)
MCH: 21.4 pg — ABNORMAL LOW (ref 26.0–34.0)
MCHC: 29.4 g/dL — ABNORMAL LOW (ref 30.0–36.0)
MCV: 72.7 fL — AB (ref 78.0–100.0)
Platelets: 101 10*3/uL — ABNORMAL LOW (ref 150–400)
RBC: 4.44 MIL/uL (ref 4.22–5.81)
RDW: 18.1 % — AB (ref 11.5–15.5)
WBC: 3.7 10*3/uL — AB (ref 4.0–10.5)

## 2014-06-03 LAB — TSH: TSH: 3.54 u[IU]/mL (ref 0.350–4.500)

## 2014-06-03 LAB — MAGNESIUM: MAGNESIUM: 2.2 mg/dL (ref 1.5–2.5)

## 2014-06-03 LAB — VITAMIN B12: VITAMIN B 12: 833 pg/mL (ref 211–911)

## 2014-06-03 NOTE — Progress Notes (Signed)
EEG completed; results pending.    

## 2014-06-03 NOTE — Consult Note (Signed)
[place] A. Merlene Laughter, MD     [email or web address]          [name] is an 80 y.o. male.   ASSESSMENT/PLAN: 1. Likely micturition syncope with a few clonic activity associated with the event. No need for antiepileptic medications at this time.  2. Baseline Parkinson's disease well controlled at this time. Continue with Sinemet.  3. Parkinson's related dementia or Alzheimer's dementia. Continue with Aricept.  The patient is a 80 year old black male who is well known to my service in the outpatient department. The patient does have a baseline history of Parkinson's disease and related dementia. He has been relatively well controlled. He was using the restroom urinating when his daughter heard a commotion in the restroom. The patient had fallen in between the commode and the wall. He was unable to move. It appears that the patient blacked out after he was observed with his eyes rolling back and had a few clonic activities. The patient cannot provide any adequate history due to his underlying cognitive impairment. There is no history of oral trauma with the event. No previous history of clonic activity or seizure. No changes in medications. No reports of chest pain, dyspnea or focal neurological deficit. The patient appears to have been at baseline after that he was taken to the hospital. The review of systems is limited but otherwise negative.  GENERAL: Pleasant in no acute distress.  HEENT: Supple. Atraumatic normocephalic.   ABDOMEN: soft  EXTREMITIES: No edema. Marked arthritic changes of knees.   BACK: Normal.  SKIN: Normal by inspection.    MENTAL STATUS: He is awake and alert. He is oriented to hospital although he thinks he is in [place]. He is not oriented to time. Speech is normal. He does follow commands well.   CRANIAL NERVES: Pupils are equal, round and reactive to light and accommodation; extra ocular movements are full, there is no significant  nystagmus; visual fields are full; upper and lower facial muscles are normal in strength and symmetric, there is no flattening of the nasolabial folds; tongue is midline; uvula is midline; shoulder elevation is normal.  MOTOR: Normal tone, bulk and strength; no pronator drift.  COORDINATION: Left finger to nose is normal, right finger to nose is normal, No rest tremor; no intention tremor; no postural tremor; no bradykinesia.  REFLEXES: Deep tendon reflexes are symmetrical and normal. Babinski reflexes are flexor bilaterally.   SENSATION: Normal to light touch.    Blood pressure 119/27, pulse 114, temperature 97.7 F (36.5 C), temperature source Oral, resp. rate 20, height 5' 6"  (1.676 m), weight 73.437 kg (161 lb 14.4 oz), SpO2 99.00%.  Past Medical History  Diagnosis Date  . Arteriosclerotic cardiovascular disease (ASCVD)     CABG & bioprosthetic aortic valve replace in 1999. PCI of right coronary in 2000, catheter in 07/2002 revealed total occlusion of the LAD; LIMA-LAD patent; patent SVG-OM1, SVG-diagonal ramus, PDA occluded; patent Cx stent, distal 30-40% stenosis Cx; nondom RCA, prox RCA 20-30% lesion, distal RCA 90% lesion; normal EF  . TIA (transient ischemic attack)   . Hypertension     normal CMet and 2011  . Hyperlipidemia     Lipid profile in 12/2009-170, 73, 79, 76  . PVD (peripheral vascular disease)   . Parkinson disease 2010    mild; + dementia  . History of sinus bradycardia     induced by beta blocker  . History of syncope   . Chronic lymphatic leukemia  mild pancytopenia in 02/2010;hemoglobin-12.9, WBC-3.3, platelets-109  . Hepatitis   . Glaucoma 09/1994  . Tobacco abuse, in remission     20 pack years; discontinued 50 years ago  . Insomnia     Middle of the night awakening  . Coronary artery disease   . Dysrhythmia     Bradycardia  . Shortness of breath   . GERD (gastroesophageal reflux disease)   . Dementia     Past Surgical History  Procedure  Laterality Date  . Retinal detachment surgery  1989  . Tonsillectomy    . Transurethral resection of prostate      Benign prostatic hypertrophy  . Coronary artery bypass graft  1999    + Bioprosthetic AVR  . Colonoscopy  2011  . A-v cardiac pacemaker insertion  02/2012    [employer] Accent DR RF    Family History  Problem Relation Age of Onset  . Pneumonia Father   . Cancer Brother   . Breast cancer Sister   . Diabetes Sister     complications    Social History:  reports that he has quit smoking. He has never used smokeless tobacco. He reports that he does not drink alcohol or use illicit drugs.  Allergies:  Allergies  Allergen Reactions  . Namenda [Memantine Hcl] Other (See Comments)    Altered mental status, agitation  . Shellfish Allergy Anaphylaxis  . Ambien [Zolpidem Tartrate]     Medications: Prior to Admission medications   Medication Sig Start Date End Date Taking? Authorizing Provider  apixaban (ELIQUIS) 2.5 MG TABS tablet Take 1 tablet (2.5 mg total) by mouth 2 (two) times daily. 10/28/13  Yes Evans Lance, MD  atorvastatin (LIPITOR) 40 MG tablet Take 1 tablet by mouth daily. 03/18/14  Yes Historical Provider, MD  carbidopa-levodopa (SINEMET) 25-100 MG per tablet Take 1 tablet by mouth 2 (two) times daily.  02/08/11  Yes Historical Provider, MD  chlorthalidone (HYGROTON) 25 MG tablet Take 12.5 mg by mouth daily.   Yes Historical Provider, MD  COMBIGAN 0.2-0.5 % ophthalmic solution Place 1 drop into both eyes daily.  08/09/11  Yes Historical Provider, MD  donepezil (ARICEPT) 10 MG tablet Take 10 mg by mouth at bedtime.  02/23/11  Yes Historical Provider, MD  ipratropium (ATROVENT) 0.06 % nasal spray Place 1 spray into both nostrils 4 (four) times daily.  12/11/12  Yes Historical Provider, MD  lisinopril-hydrochlorothiazide (PRINZIDE,ZESTORETIC) 20-12.5 MG per tablet Take 1 tablet by mouth daily.   Yes Historical Provider, MD  torsemide (DEMADEX) 100 MG tablet Take 50 mg  by mouth daily.   Yes Historical Provider, MD    Scheduled Meds: . atorvastatin  40 mg Oral Daily  . brimonidine  1 drop Both Eyes TID  . carbidopa-levodopa  1 tablet Oral BID  . donepezil  10 mg Oral QHS  . ipratropium  1 spray Each Nare QID  . sodium chloride  3 mL Intravenous Q12H  . timolol  1 drop Both Eyes Daily   Continuous Infusions: . 0.9 % NaCl with KCl 20 mEq / L 100 mL/hr at 06/03/14 1354   PRN Meds:.acetaminophen, acetaminophen, ondansetron (ZOFRAN) IV, ondansetron     Results for orders placed during the hospital encounter of 06/02/14 (from the past 48 hour(s))  CBC WITH DIFFERENTIAL     Status: Abnormal   Collection Time    06/02/14 10:47 AM      Result Value Ref Range   WBC 2.7 (*) 4.0 - 10.5 K/uL  RBC 4.56  4.22 - 5.81 MIL/uL   Hemoglobin 9.9 (*) 13.0 - 17.0 g/dL   HCT 32.9 (*) 39.0 - 52.0 %   MCV 72.1 (*) 78.0 - 100.0 fL   MCH 21.7 (*) 26.0 - 34.0 pg   MCHC 30.1  30.0 - 36.0 g/dL   RDW 18.0 (*) 11.5 - 15.5 %   Platelets 85 (*) 150 - 400 K/uL   Comment: SPECIMEN CHECKED FOR CLOTS     PLATELET COUNT CONFIRMED BY SMEAR   Neutrophils Relative % 55  43 - 77 %   Neutro Abs 1.5 (*) 1.7 - 7.7 K/uL   Lymphocytes Relative 34  12 - 46 %   Lymphs Abs 0.9  0.7 - 4.0 K/uL   Monocytes Relative 9  3 - 12 %   Monocytes Absolute 0.2  0.1 - 1.0 K/uL   Eosinophils Relative 2  0 - 5 %   Eosinophils Absolute 0.0  0.0 - 0.7 K/uL   Basophils Relative 0  0 - 1 %   Basophils Absolute 0.0  0.0 - 0.1 K/uL  COMPREHENSIVE METABOLIC PANEL     Status: Abnormal   Collection Time    06/02/14 10:47 AM      Result Value Ref Range   Sodium 139  137 - 147 mEq/L   Potassium 3.2 (*) 3.7 - 5.3 mEq/L   Chloride 91 (*) 96 - 112 mEq/L   CO2 34 (*) 19 - 32 mEq/L   Glucose, Bld 198 (*) 70 - 99 mg/dL   BUN 83 (*) 6 - 23 mg/dL   Creatinine, Ser 3.14 (*) 0.50 - 1.35 mg/dL   Calcium 8.8  8.4 - 10.5 mg/dL   Total Protein 6.5  6.0 - 8.3 g/dL   Albumin 3.2 (*) 3.5 - 5.2 g/dL   AST 18  0 -  37 U/L   ALT 15  0 - 53 U/L   Alkaline Phosphatase 132 (*) 39 - 117 U/L   Total Bilirubin 0.8  0.3 - 1.2 mg/dL   GFR calc non Af Amer 16 (*) >90 mL/min   GFR calc Af Amer 19 (*) >90 mL/min   Comment: (NOTE)     The eGFR has been calculated using the CKD EPI equation.     This calculation has not been validated in all clinical situations.     eGFR's persistently <90 mL/min signify possible Chronic Kidney     Disease.   Anion gap 14  5 - 15  TROPONIN I     Status: None   Collection Time    06/02/14 10:47 AM      Result Value Ref Range   Troponin I <0.30  <0.30 ng/mL   Comment:            Due to the release kinetics of cTnI,     a negative result within the first hours     of the onset of symptoms does not rule out     myocardial infarction with certainty.     If myocardial infarction is still suspected,     repeat the test at appropriate intervals.  TSH     Status: None   Collection Time    06/02/14 10:47 AM      Result Value Ref Range   TSH 3.540  0.350 - 4.500 uIU/mL   Comment: Performed at [facility]     Status: None   Collection Time    06/02/14 10:47 AM  Result Value Ref Range   Vitamin B-12 833  211 - 911 pg/mL   Comment: Performed at [facility]     Status: Abnormal   Collection Time    06/03/14  5:50 AM      Result Value Ref Range   Sodium 141  137 - 147 mEq/L   Potassium 3.9  3.7 - 5.3 mEq/L   Comment: DELTA CHECK NOTED   Chloride 99  96 - 112 mEq/L   CO2 31  19 - 32 mEq/L   Glucose, Bld 173 (*) 70 - 99 mg/dL   BUN 70 (*) 6 - 23 mg/dL   Creatinine, Ser 2.32 (*) 0.50 - 1.35 mg/dL   Calcium 8.6  8.4 - 10.5 mg/dL   GFR calc non Af Amer 23 (*) >90 mL/min   GFR calc Af Amer 27 (*) >90 mL/min   Comment: (NOTE)     The eGFR has been calculated using the CKD EPI equation.     This calculation has not been validated in all clinical situations.     eGFR's persistently <90 mL/min signify possible Chronic  Kidney     Disease.   Anion gap 11  5 - 15  CBC     Status: Abnormal   Collection Time    06/03/14  5:50 AM      Result Value Ref Range   WBC 3.7 (*) 4.0 - 10.5 K/uL   RBC 4.44  4.22 - 5.81 MIL/uL   Hemoglobin 9.5 (*) 13.0 - 17.0 g/dL   HCT 32.3 (*) 39.0 - 52.0 %   MCV 72.7 (*) 78.0 - 100.0 fL   MCH 21.4 (*) 26.0 - 34.0 pg   MCHC 29.4 (*) 30.0 - 36.0 g/dL   RDW 18.1 (*) 11.5 - 15.5 %   Platelets 101 (*) 150 - 400 K/uL   Comment: SPECIMEN CHECKED FOR CLOTS     PLATELETS APPEAR DECREASED     PLATELET COUNT CONFIRMED BY SMEAR  MAGNESIUM     Status: None   Collection Time    06/03/14  5:50 AM      Result Value Ref Range   Magnesium 2.2  1.5 - 2.5 mg/dL  URINALYSIS, ROUTINE W REFLEX MICROSCOPIC     Status: Abnormal   Collection Time    06/03/14  8:45 AM      Result Value Ref Range   Color, Urine YELLOW  YELLOW   APPearance CLEAR  CLEAR   Specific Gravity, Urine <1.005 (*) 1.005 - 1.030   pH 7.5  5.0 - 8.0   Glucose, UA NEGATIVE  NEGATIVE mg/dL   Hgb urine dipstick NEGATIVE  NEGATIVE   Bilirubin Urine NEGATIVE  NEGATIVE   Ketones, ur NEGATIVE  NEGATIVE mg/dL   Protein, ur NEGATIVE  NEGATIVE mg/dL   Urobilinogen, UA 1.0  0.0 - 1.0 mg/dL   Nitrite NEGATIVE  NEGATIVE   Leukocytes, UA NEGATIVE  NEGATIVE   Comment: MICROSCOPIC NOT DONE ON URINES WITH NEGATIVE PROTEIN, BLOOD, LEUKOCYTES, NITRITE, OR GLUCOSE <1000 mg/dL.    Studies/Results:  HEAD CT 1. There is no acute ischemic or hemorrhagic event within the brain.  2. There is moderate stable ventriculomegaly with mild diffuse  atrophy. There are stable changes of chronic small vessel ischemia.  There is an old lacunar infarction in the right cerebellar  hemisphere.  3. There is no acute skull fracture.    EEG 1. This recording of awake and sleep states is essentially unremarkable.  Floella Ensz A. Merlene Laughter, M.D.  Diplomate, Tax adviser of Psychiatry and Neurology ( Neurology). 06/03/2014, 8:18 PM

## 2014-06-03 NOTE — Procedures (Signed)
  [place] A. Merlene Laughter, MD     [email or web address]           HISTORY: The patient is a 80 year old who presents with a spell of syncope suspicious for seizures.  MEDICATIONS: Scheduled Meds: . atorvastatin  40 mg Oral Daily  . brimonidine  1 drop Both Eyes TID  . carbidopa-levodopa  1 tablet Oral BID  . donepezil  10 mg Oral QHS  . ipratropium  1 spray Each Nare QID  . sodium chloride  3 mL Intravenous Q12H  . timolol  1 drop Both Eyes Daily   Continuous Infusions: . 0.9 % NaCl with KCl 20 mEq / L 100 mL/hr at 06/03/14 1354   PRN Meds:.acetaminophen, acetaminophen, ondansetron (ZOFRAN) IV, ondansetron  Prior to Admission medications   Medication Sig Start Date End Date Taking? Authorizing Provider  apixaban (ELIQUIS) 2.5 MG TABS tablet Take 1 tablet (2.5 mg total) by mouth 2 (two) times daily. 10/28/13  Yes Evans Lance, MD  atorvastatin (LIPITOR) 40 MG tablet Take 1 tablet by mouth daily. 03/18/14  Yes Historical Provider, MD  carbidopa-levodopa (SINEMET) 25-100 MG per tablet Take 1 tablet by mouth 2 (two) times daily.  02/08/11  Yes Historical Provider, MD  chlorthalidone (HYGROTON) 25 MG tablet Take 12.5 mg by mouth daily.   Yes Historical Provider, MD  COMBIGAN 0.2-0.5 % ophthalmic solution Place 1 drop into both eyes daily.  08/09/11  Yes Historical Provider, MD  donepezil (ARICEPT) 10 MG tablet Take 10 mg by mouth at bedtime.  02/23/11  Yes Historical Provider, MD  ipratropium (ATROVENT) 0.06 % nasal spray Place 1 spray into both nostrils 4 (four) times daily.  12/11/12  Yes Historical Provider, MD  lisinopril-hydrochlorothiazide (PRINZIDE,ZESTORETIC) 20-12.5 MG per tablet Take 1 tablet by mouth daily.   Yes Historical Provider, MD  torsemide (DEMADEX) 100 MG tablet Take 50 mg by mouth daily.   Yes Historical Provider, MD      ANALYSIS: A 16 channel recording using standard 10 20 measurements is conducted for 20 minutes. There is a background activity  that gets as high as 6 1/2- 7 Hz. Awake and sleep activities are recorded. K complexes are noted. There is some beta activity observed in the frontal areas. Photic simulation and hyperventilation are not carried out. There is no focal or lateralized slowing. There is no epileptiform activity   IMPRESSION: 1. This recording of awake and sleep states is essentially unremarkable.      Byren Pankow A. Merlene Laughter, M.D.  Diplomate, Tax adviser of Psychiatry and Neurology ( Neurology).

## 2014-06-03 NOTE — Telephone Encounter (Signed)
LMOVM reminding pt to send remote transmission.   

## 2014-06-03 NOTE — Progress Notes (Signed)
Inpatient Diabetes Program Recommendations  AACE/ADA: New Consensus Statement on Inpatient Glycemic Control (2013)  Target Ranges:  Prepandial:   less than 140 mg/dL      Peak postprandial:   less than 180 mg/dL (1-2 hours)      Critically ill patients:  140 - 180 mg/dL   Results for [name], [name] (MRN [id number]) as of 06/03/2014 09:08  Ref. Range 06/02/2014 10:47 06/03/2014 05:50  Glucose Latest Range: 70-99 mg/dL 198 (H) 173 (H)   Diabetes history: No Outpatient Diabetes medications: NA Current orders for Inpatient glycemic control: None  Inpatient Diabetes Program Recommendations Correction (SSI): Please consider ordering CBGs with Novolog sensitive correction ACHS if appropriate for patient. HgbA1C: Did not note any documented history of diabetes. However, A1C was 7.5% on 08/07/2012.  Please consider ordering an A1C to evaluate glycemic control.  Thanks, Barnie Alderman, RN, MSN, CCRN Diabetes Coordinator Inpatient Diabetes Program [phone] (Team Pager) [phone] ([facility] office) [phone] [facility] office)

## 2014-06-03 NOTE — Evaluation (Signed)
Physical Therapy Evaluation Patient Details Name: [name] MRN: [id number] DOB: September 25, 1920 Today's Date: 06/03/2014   History of Present Illness  HPI: [name] is a 80 y.o. male who was recently discharged from the hospital after being treated for acute on chronic renal failure. After his discharge, patient came back to the emergency room for evaluation for weakness. He was discharged from the emergency room with plans for outpatient followup. Within 2 days, he came back to the emergency room for evaluation today. The patient has dementia and is unable to provide any reliable history. History is obtained from his daughter. She reports that he has been somewhat sleepy the past few days. Today, she noticed that he was sitting on the commode and had fallen over. He was still somewhat lethargic and therefore EMS was called. Upon their arrival, the patient's daughter felt that the patient may have had a seizure. She describes his eyes rolling in the back of his head and possible shaking movements. She is unsure whether he had any urinary or bowel incontinence. He did not have any tongue biting. He does not have a prior history of seizures. He did not appear to have any significant postictal period. He was evaluated in the emergency room where his renal function was noted to be worsening. He did appear to be mildly dehydrated. He is getting admitted for further evaluation.  Clinical Impression   Pt was seen for an evaluation and found to be very close to the level he was at at discharge a week ago.  He continues to be deconditioned with unsteady gait.  He had been receiving HHPT and we will plan on continuing this on discharge this time around.  We will work with him here for general strengthening, balance, transfers and gait with a rolling walker.    Follow Up Recommendations Home health PT    Equipment Recommendations  None recommended by PT    Recommendations for Other Services    none    Precautions / Restrictions Precautions Precautions: Fall Restrictions Weight Bearing Restrictions: No      Mobility  Bed Mobility               General bed mobility comments: pt up in a chair...bed mobility not tested  Transfers Overall transfer level: Needs assistance Equipment used: Rolling walker (2 wheeled) Transfers: Sit to/from Stand Sit to Stand: Supervision            Ambulation/Gait Ambulation/Gait assistance: Min guard Ambulation Distance (Feet): 15 Feet (15' x 2)   Gait Pattern/deviations: Trunk flexed;Decreased stride length   Gait velocity interpretation: Below normal speed for age/gender    Stairs            Wheelchair Mobility    Modified Rankin (Stroke Patients Only)       Balance Overall balance assessment: Needs assistance Sitting-balance support: No upper extremity supported Sitting balance-Leahy Scale: Good     Standing balance support: Bilateral upper extremity supported Standing balance-Leahy Scale: Fair                               Pertinent Vitals/Pain Pain Assessment: No/denies pain    Home Living Family/patient expects to be discharged to:: Private residence Living Arrangements: Children Available Help at Discharge: Family;Available 24 hours/day Type of Home: House Home Access: Stairs to enter Entrance Stairs-Rails: Can reach both Entrance Stairs-Number of Steps: 3 Home Layout: One level Home Equipment: Kasandra Knudsen -  single point;Walker - 4 wheels      Prior Function Level of Independence: Needs assistance   Gait / Transfers Assistance Needed: pt has been needing supervision in gait with 4 wheeled walker and transfers  ADL's / Homemaking Assistance Needed: mod assist with personal ADLs, full assist with homemaking        Hand Dominance        Extremity/Trunk Assessment               Lower Extremity Assessment: Generalized weakness      Cervical / Trunk Assessment:  Kyphotic  Communication   Communication: No difficulties  Cognition Arousal/Alertness: Awake/alert Behavior During Therapy: WFL for tasks assessed/performed Overall Cognitive Status: Within Functional Limits for tasks assessed                      General Comments      Exercises General Exercises - Lower Extremity Ankle Circles/Pumps: AROM;Both;10 reps;Seated Long Arc Quad: AROM;Both;10 reps;Seated Hip ABduction/ADduction: AROM;Strengthening;Both;10 reps;Seated Hip Flexion/Marching: AROM;Both;10 reps;Seated      Assessment/Plan    PT Assessment Patient needs continued PT services  PT Diagnosis Difficulty walking;Generalized weakness   PT Problem List Decreased strength;Decreased activity tolerance;Decreased mobility  PT Treatment Interventions Gait training;Therapeutic exercise;Balance training   PT Goals (Current goals can be found in the Care Plan section) Acute Rehab PT Goals Patient Stated Goal: none stated PT Goal Formulation: With patient/family Time For Goal Achievement: 06/17/14 Potential to Achieve Goals: Good    Frequency Min 3X/week   Barriers to discharge        Co-evaluation               End of Session Equipment Utilized During Treatment: Gait belt Activity Tolerance: Patient tolerated treatment well Patient left: in chair;with call bell/phone within reach;with family/visitor present Nurse Communication: Mobility status         Time: 9470-9628 PT Time Calculation (min): 43 min   Charges:   PT Evaluation $Initial PT Evaluation Tier I: 1 Procedure     PT G CodesDemetrios Isaacs L 06/03/2014, 3:34 PM

## 2014-06-03 NOTE — Progress Notes (Signed)
Subjective: He was admitted yesterday with syncope and perhaps a seizure. He was dehydrated. Part of the problem has been that he has congestive heart failure with significant edema but he's not taking in fluids very well. He has continued to become weaker and weaker.  Objective: Vital signs in last 24 hours: Temp:  [97.6 F (36.4 C)-98.6 F (37 C)] 98.6 F (37 C) (10/08 0453) Pulse Rate:  [64-106] 74 (10/08 0453) Resp:  [0-40] 20 (10/08 0453) BP: (112-146)/(23-106) 119/28 mmHg (10/08 0453) SpO2:  [94 %-100 %] 100 % (10/08 0453) Weight:  [73.437 kg (161 lb 14.4 oz)-81.647 kg (180 lb)] 73.437 kg (161 lb 14.4 oz) (10/07 1647) Weight change:  Last BM Date: 06/01/14  Intake/Output from previous day: 10/07 0701 - 10/08 0700 In: 8.3 [I.V.:8.3] Out: -   PHYSICAL EXAM General appearance: alert, cooperative and mild distress Resp: clear to auscultation bilaterally Cardio: regular rate and rhythm, S1, S2 normal, no murmur, click, rub or gallop GI: soft, non-tender; bowel sounds normal; no masses,  no organomegaly Extremities: 1+ edema  Lab Results:  Results for orders placed during the hospital encounter of 06/02/14 (from the past 48 hour(s))  CBC WITH DIFFERENTIAL     Status: Abnormal   Collection Time    06/02/14 10:47 AM      Result Value Ref Range   WBC 2.7 (*) 4.0 - 10.5 K/uL   RBC 4.56  4.22 - 5.81 MIL/uL   Hemoglobin 9.9 (*) 13.0 - 17.0 g/dL   HCT 32.9 (*) 39.0 - 52.0 %   MCV 72.1 (*) 78.0 - 100.0 fL   MCH 21.7 (*) 26.0 - 34.0 pg   MCHC 30.1  30.0 - 36.0 g/dL   RDW 18.0 (*) 11.5 - 15.5 %   Platelets 85 (*) 150 - 400 K/uL   Comment: SPECIMEN CHECKED FOR CLOTS     PLATELET COUNT CONFIRMED BY SMEAR   Neutrophils Relative % 55  43 - 77 %   Neutro Abs 1.5 (*) 1.7 - 7.7 K/uL   Lymphocytes Relative 34  12 - 46 %   Lymphs Abs 0.9  0.7 - 4.0 K/uL   Monocytes Relative 9  3 - 12 %   Monocytes Absolute 0.2  0.1 - 1.0 K/uL   Eosinophils Relative 2  0 - 5 %   Eosinophils Absolute  0.0  0.0 - 0.7 K/uL   Basophils Relative 0  0 - 1 %   Basophils Absolute 0.0  0.0 - 0.1 K/uL  COMPREHENSIVE METABOLIC PANEL     Status: Abnormal   Collection Time    06/02/14 10:47 AM      Result Value Ref Range   Sodium 139  137 - 147 mEq/L   Potassium 3.2 (*) 3.7 - 5.3 mEq/L   Chloride 91 (*) 96 - 112 mEq/L   CO2 34 (*) 19 - 32 mEq/L   Glucose, Bld 198 (*) 70 - 99 mg/dL   BUN 83 (*) 6 - 23 mg/dL   Creatinine, Ser 3.14 (*) 0.50 - 1.35 mg/dL   Calcium 8.8  8.4 - 10.5 mg/dL   Total Protein 6.5  6.0 - 8.3 g/dL   Albumin 3.2 (*) 3.5 - 5.2 g/dL   AST 18  0 - 37 U/L   ALT 15  0 - 53 U/L   Alkaline Phosphatase 132 (*) 39 - 117 U/L   Total Bilirubin 0.8  0.3 - 1.2 mg/dL   GFR calc non Af Amer 16 (*) >90 mL/min  GFR calc Af Amer 19 (*) >90 mL/min   Comment: (NOTE)     The eGFR has been calculated using the CKD EPI equation.     This calculation has not been validated in all clinical situations.     eGFR's persistently <90 mL/min signify possible Chronic Kidney     Disease.   Anion gap 14  5 - 15  TROPONIN I     Status: None   Collection Time    06/02/14 10:47 AM      Result Value Ref Range   Troponin I <0.30  <0.30 ng/mL   Comment:            Due to the release kinetics of cTnI,     a negative result within the first hours     of the onset of symptoms does not rule out     myocardial infarction with certainty.     If myocardial infarction is still suspected,     repeat the test at appropriate intervals.  TSH     Status: None   Collection Time    06/02/14 10:47 AM      Result Value Ref Range   TSH 3.540  0.350 - 4.500 uIU/mL   Comment: Performed at [place]     Status: None   Collection Time    06/02/14 10:47 AM      Result Value Ref Range   Vitamin B-12 833  211 - 911 pg/mL   Comment: Performed at [place]     Status: Abnormal   Collection Time    06/03/14  5:50 AM      Result Value Ref Range   Sodium 141   137 - 147 mEq/L   Potassium 3.9  3.7 - 5.3 mEq/L   Comment: DELTA CHECK NOTED   Chloride 99  96 - 112 mEq/L   CO2 31  19 - 32 mEq/L   Glucose, Bld 173 (*) 70 - 99 mg/dL   BUN 70 (*) 6 - 23 mg/dL   Creatinine, Ser 2.32 (*) 0.50 - 1.35 mg/dL   Calcium 8.6  8.4 - 10.5 mg/dL   GFR calc non Af Amer 23 (*) >90 mL/min   GFR calc Af Amer 27 (*) >90 mL/min   Comment: (NOTE)     The eGFR has been calculated using the CKD EPI equation.     This calculation has not been validated in all clinical situations.     eGFR's persistently <90 mL/min signify possible Chronic Kidney     Disease.   Anion gap 11  5 - 15  CBC     Status: Abnormal   Collection Time    06/03/14  5:50 AM      Result Value Ref Range   WBC 3.7 (*) 4.0 - 10.5 K/uL   RBC 4.44  4.22 - 5.81 MIL/uL   Hemoglobin 9.5 (*) 13.0 - 17.0 g/dL   HCT 32.3 (*) 39.0 - 52.0 %   MCV 72.7 (*) 78.0 - 100.0 fL   MCH 21.4 (*) 26.0 - 34.0 pg   MCHC 29.4 (*) 30.0 - 36.0 g/dL   RDW 18.1 (*) 11.5 - 15.5 %   Platelets 101 (*) 150 - 400 K/uL   Comment: SPECIMEN CHECKED FOR CLOTS     PLATELETS APPEAR DECREASED     PLATELET COUNT CONFIRMED BY SMEAR  MAGNESIUM     Status: None   Collection Time  06/03/14  5:50 AM      Result Value Ref Range   Magnesium 2.2  1.5 - 2.5 mg/dL    ABGS No results found for this basename: PHART, PCO2, PO2ART, TCO2, HCO3,  in the last 72 hours CULTURES No results found for this or any previous visit (from the past 240 hour(s)). Studies/Results: Ct Head Wo Contrast  06/02/2014   CLINICAL DATA:  Seizure activity with subsequent fall in the bathroom; now with generalized weakness; history of previous TIA, Parkinson's disease, as well as coronary artery disease; permanent pacemaker placement  EXAM: CT HEAD WITHOUT CONTRAST  TECHNIQUE: Contiguous axial images were obtained from the base of the skull through the vertex without intravenous contrast.  COMPARISON:  Noncontrast CT scan of the brain of July 06, 2009.   FINDINGS: There is mild diffuse cerebral and cerebellar atrophy. There is moderate ventriculomegaly. There is no intracranial hemorrhage nor intracranial mass effect. There is stable decreased density in the deep white matter of both cerebral hemispheres. There is an old lacunar infarction in the right cerebellar hemisphere. No acute ischemic changes are demonstrated.  There is mucoperiosteal thickening within left ethmoid sinus cells which is stable. There is a retention cyst or polyp in a right sphenoid sinus cell. The mastoid air cells are well pneumatized. There is no acute skull fracture. There is no significant cephalohematoma.  IMPRESSION: 1. There is no acute ischemic or hemorrhagic event within the brain. 2. There is moderate stable ventriculomegaly with mild diffuse atrophy. There are stable changes of chronic small vessel ischemia. There is an old lacunar infarction in the right cerebellar hemisphere. 3. There is no acute skull fracture.   Electronically Signed   By: David  Martinique   On: 06/02/2014 12:17   Dg Chest Port 1 View  06/02/2014   CLINICAL DATA:  Initial encounter for worsening weakness.  EXAM: PORTABLE CHEST - 1 VIEW  COMPARISON:  05/31/2014  FINDINGS: 1105 hrs. The lungs are clear without focal infiltrate, edema, pneumothorax or pleural effusion. The cardio pericardial silhouette is enlarged. Left-sided dual lead permanent pacemaker remains in place. Imaged bony structures of the thorax are intact. Telemetry leads overlie the chest.  IMPRESSION: No acute cardiopulmonary findings.   Electronically Signed   By: Misty Stanley M.D.   On: 06/02/2014 11:30   Dg Foot Complete Right  06/02/2014   CLINICAL DATA:  Initial encounter for Fall due to seizure today. Injury with pain in the great toe.  EXAM: RIGHT FOOT COMPLETE - 3+ VIEW  COMPARISON:  None.  FINDINGS: Bones are diffusely demineralized. Limited evaluation of the tarsometatarsal joints due to positioning. Degenerative changes are noted in  the tibiotalar joint. There is also degenerative change in the MTP joint of the great toe. No acute fracture. No subluxation or dislocation. Vascular calcifications suggest diabetes.  IMPRESSION: No acute bony findings.   Electronically Signed   By: Misty Stanley M.D.   On: 06/02/2014 16:18    Medications:  Prior to Admission:  Prescriptions prior to admission  Medication Sig Dispense Refill  . apixaban (ELIQUIS) 2.5 MG TABS tablet Take 1 tablet (2.5 mg total) by mouth 2 (two) times daily.  60 tablet  6  . atorvastatin (LIPITOR) 40 MG tablet Take 1 tablet by mouth daily.      . carbidopa-levodopa (SINEMET) 25-100 MG per tablet Take 1 tablet by mouth 2 (two) times daily.       . chlorthalidone (HYGROTON) 25 MG tablet Take 12.5 mg by mouth daily.      Marland Kitchen  COMBIGAN 0.2-0.5 % ophthalmic solution Place 1 drop into both eyes daily.       Marland Kitchen donepezil (ARICEPT) 10 MG tablet Take 10 mg by mouth at bedtime.       Marland Kitchen ipratropium (ATROVENT) 0.06 % nasal spray Place 1 spray into both nostrils 4 (four) times daily.       Marland Kitchen lisinopril-hydrochlorothiazide (PRINZIDE,ZESTORETIC) 20-12.5 MG per tablet Take 1 tablet by mouth daily.      Marland Kitchen torsemide (DEMADEX) 100 MG tablet Take 50 mg by mouth daily.       Scheduled: . atorvastatin  40 mg Oral Daily  . brimonidine  1 drop Both Eyes TID  . carbidopa-levodopa  1 tablet Oral BID  . donepezil  10 mg Oral QHS  . ipratropium  1 spray Each Nare QID  . sodium chloride  3 mL Intravenous Q12H  . timolol  1 drop Both Eyes Daily   Continuous: . 0.9 % NaCl with KCl 20 mEq / L 100 mL/hr at 06/03/14 7322   VOH:[id number], acetaminophen, ondansetron (ZOFRAN) IV, ondansetron  Assesment: He was admitted with dehydration and weakness. I think some of this is related to his diuretics. I think we should stop those and only start him back with symptoms. He is known to have aortic valve disease, congestive heart failure, coronary artery occlusive disease and he has chronic kidney  disease which is worse now with his dehydration. 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 Continue with fluids. No other changes. Repeat his lab work in the morning. Hold diuretics    LOS: 1 day   Shiron Whetsel L 06/03/2014, 8:48 AM

## 2014-06-04 ENCOUNTER — Ambulatory Visit (INDEPENDENT_AMBULATORY_CARE_PROVIDER_SITE_OTHER): Payer: Medicare Other | Admitting: *Deleted

## 2014-06-04 ENCOUNTER — Encounter: Payer: Self-pay | Admitting: Cardiology

## 2014-06-04 DIAGNOSIS — R001 Bradycardia, unspecified: Secondary | ICD-10-CM

## 2014-06-04 LAB — BASIC METABOLIC PANEL
ANION GAP: 10 (ref 5–15)
BUN: 55 mg/dL — ABNORMAL HIGH (ref 6–23)
CHLORIDE: 104 meq/L (ref 96–112)
CO2: 27 mEq/L (ref 19–32)
CREATININE: 1.95 mg/dL — AB (ref 0.50–1.35)
Calcium: 8.4 mg/dL (ref 8.4–10.5)
GFR calc Af Amer: 33 mL/min — ABNORMAL LOW (ref >90)
GFR calc non Af Amer: 29 mL/min — ABNORMAL LOW (ref >90)
Glucose, Bld: 170 mg/dL — ABNORMAL HIGH (ref 70–99)
Potassium: 4.4 mEq/L (ref 3.7–5.3)
SODIUM: 141 meq/L (ref 137–147)

## 2014-06-04 NOTE — Care Management Note (Unsigned)
    Page 1 of 1   06/04/2014     5:11:24 PM CARE MANAGEMENT NOTE 06/04/2014  Patient:  [name], [name]   Account Number:  [id number]  Date Initiated:  06/04/2014  Documentation initiated by:  Vladimir Creeks  Subjective/Objective Assessment:   Admitted with CHF. Pt is from home, with family, and will be returning home at D/C. He has [facility] active in the home and would like to continue with them     Action/Plan:   Anticipated DC Date:  06/06/2014   Anticipated DC Plan:  [place]  CM consult      [facility] Choice  HOME HEALTH   Choice offered to / List presented to:  C-1 Patient        [place] arranged  HH-1 RN  [place]      [place].   Status of service:  In process, will continue to follow Medicare Important Message given?  YES (If response is "NO", the following Medicare IM given date fields will be blank) Date Medicare IM given:  06/04/2014 Medicare IM given by:  Vladimir Creeks Date Additional Medicare IM given:   Additional Medicare IM given by:    Discharge Disposition:    Per UR Regulation:  Reviewed for med. necessity/level of care/duration of stay  If discussed at [place] of Stay Meetings, dates discussed:    Comments:  06/04/14 [place] RN/CM

## 2014-06-04 NOTE — Progress Notes (Signed)
Subjective: He feels well. He is complaining of some pain in his right foot his x-ray was negative. His renal function has improved urology consultation is appreciated  Objective: Vital signs in last 24 hours: Temp:  [97.7 F (36.5 C)-98.4 F (36.9 C)] 98 F (36.7 C) (10/09 0637) Pulse Rate:  [74-114] 74 (10/09 0637) Resp:  [18-20] 20 (10/09 0637) BP: (103-119)/(27-36) 112/36 mmHg (10/09 0637) SpO2:  [99 %-100 %] 100 % (10/09 7673) Weight change:  Last BM Date: 06/01/14  Intake/Output from previous day: 10/08 0701 - 10/09 0700 In: 3108.3 [P.O.:720; I.V.:2388.3] Out: 300 [Urine:300]  PHYSICAL EXAM General appearance: alert, cooperative and mild distress Resp: clear to auscultation bilaterally Cardio: He is in atrial fibrillation with a well-controlled ventricular response GI: soft, non-tender; bowel sounds normal; no masses,  no organomegaly Extremities: extremities normal, atraumatic, no cyanosis or edema  Lab Results:  Results for orders placed during the hospital encounter of 06/02/14 (from the past 48 hour(s))  CBC WITH DIFFERENTIAL     Status: Abnormal   Collection Time    06/02/14 10:47 AM      Result Value Ref Range   WBC 2.7 (*) 4.0 - 10.5 K/uL   RBC 4.56  4.22 - 5.81 MIL/uL   Hemoglobin 9.9 (*) 13.0 - 17.0 g/dL   HCT 32.9 (*) 39.0 - 52.0 %   MCV 72.1 (*) 78.0 - 100.0 fL   MCH 21.7 (*) 26.0 - 34.0 pg   MCHC 30.1  30.0 - 36.0 g/dL   RDW 18.0 (*) 11.5 - 15.5 %   Platelets 85 (*) 150 - 400 K/uL   Comment: SPECIMEN CHECKED FOR CLOTS     PLATELET COUNT CONFIRMED BY SMEAR   Neutrophils Relative % 55  43 - 77 %   Neutro Abs 1.5 (*) 1.7 - 7.7 K/uL   Lymphocytes Relative 34  12 - 46 %   Lymphs Abs 0.9  0.7 - 4.0 K/uL   Monocytes Relative 9  3 - 12 %   Monocytes Absolute 0.2  0.1 - 1.0 K/uL   Eosinophils Relative 2  0 - 5 %   Eosinophils Absolute 0.0  0.0 - 0.7 K/uL   Basophils Relative 0  0 - 1 %   Basophils Absolute 0.0  0.0 - 0.1 K/uL  COMPREHENSIVE METABOLIC  PANEL     Status: Abnormal   Collection Time    06/02/14 10:47 AM      Result Value Ref Range   Sodium 139  137 - 147 mEq/L   Potassium 3.2 (*) 3.7 - 5.3 mEq/L   Chloride 91 (*) 96 - 112 mEq/L   CO2 34 (*) 19 - 32 mEq/L   Glucose, Bld 198 (*) 70 - 99 mg/dL   BUN 83 (*) 6 - 23 mg/dL   Creatinine, Ser 3.14 (*) 0.50 - 1.35 mg/dL   Calcium 8.8  8.4 - 10.5 mg/dL   Total Protein 6.5  6.0 - 8.3 g/dL   Albumin 3.2 (*) 3.5 - 5.2 g/dL   AST 18  0 - 37 U/L   ALT 15  0 - 53 U/L   Alkaline Phosphatase 132 (*) 39 - 117 U/L   Total Bilirubin 0.8  0.3 - 1.2 mg/dL   GFR calc non Af Amer 16 (*) >90 mL/min   GFR calc Af Amer 19 (*) >90 mL/min   Comment: (NOTE)     The eGFR has been calculated using the CKD EPI equation.     This calculation has  not been validated in all clinical situations.     eGFR's persistently <90 mL/min signify possible Chronic Kidney     Disease.   Anion gap 14  5 - 15  TROPONIN I     Status: None   Collection Time    06/02/14 10:47 AM      Result Value Ref Range   Troponin I <0.30  <0.30 ng/mL   Comment:            Due to the release kinetics of cTnI,     a negative result within the first hours     of the onset of symptoms does not rule out     myocardial infarction with certainty.     If myocardial infarction is still suspected,     repeat the test at appropriate intervals.  TSH     Status: None   Collection Time    06/02/14 10:47 AM      Result Value Ref Range   TSH 3.540  0.350 - 4.500 uIU/mL   Comment: Performed at [place]     Status: None   Collection Time    06/02/14 10:47 AM      Result Value Ref Range   Vitamin B-12 833  211 - 911 pg/mL   Comment: Performed at [place]     Status: Abnormal   Collection Time    06/03/14  5:50 AM      Result Value Ref Range   Sodium 141  137 - 147 mEq/L   Potassium 3.9  3.7 - 5.3 mEq/L   Comment: DELTA CHECK NOTED   Chloride 99  96 - 112 mEq/L   CO2 31   19 - 32 mEq/L   Glucose, Bld 173 (*) 70 - 99 mg/dL   BUN 70 (*) 6 - 23 mg/dL   Creatinine, Ser 2.32 (*) 0.50 - 1.35 mg/dL   Calcium 8.6  8.4 - 10.5 mg/dL   GFR calc non Af Amer 23 (*) >90 mL/min   GFR calc Af Amer 27 (*) >90 mL/min   Comment: (NOTE)     The eGFR has been calculated using the CKD EPI equation.     This calculation has not been validated in all clinical situations.     eGFR's persistently <90 mL/min signify possible Chronic Kidney     Disease.   Anion gap 11  5 - 15  CBC     Status: Abnormal   Collection Time    06/03/14  5:50 AM      Result Value Ref Range   WBC 3.7 (*) 4.0 - 10.5 K/uL   RBC 4.44  4.22 - 5.81 MIL/uL   Hemoglobin 9.5 (*) 13.0 - 17.0 g/dL   HCT 32.3 (*) 39.0 - 52.0 %   MCV 72.7 (*) 78.0 - 100.0 fL   MCH 21.4 (*) 26.0 - 34.0 pg   MCHC 29.4 (*) 30.0 - 36.0 g/dL   RDW 18.1 (*) 11.5 - 15.5 %   Platelets 101 (*) 150 - 400 K/uL   Comment: SPECIMEN CHECKED FOR CLOTS     PLATELETS APPEAR DECREASED     PLATELET COUNT CONFIRMED BY SMEAR  MAGNESIUM     Status: None   Collection Time    06/03/14  5:50 AM      Result Value Ref Range   Magnesium 2.2  1.5 - 2.5 mg/dL  URINALYSIS, ROUTINE W REFLEX MICROSCOPIC  Status: Abnormal   Collection Time    06/03/14  8:45 AM      Result Value Ref Range   Color, Urine YELLOW  YELLOW   APPearance CLEAR  CLEAR   Specific Gravity, Urine <1.005 (*) 1.005 - 1.030   pH 7.5  5.0 - 8.0   Glucose, UA NEGATIVE  NEGATIVE mg/dL   Hgb urine dipstick NEGATIVE  NEGATIVE   Bilirubin Urine NEGATIVE  NEGATIVE   Ketones, ur NEGATIVE  NEGATIVE mg/dL   Protein, ur NEGATIVE  NEGATIVE mg/dL   Urobilinogen, UA 1.0  0.0 - 1.0 mg/dL   Nitrite NEGATIVE  NEGATIVE   Leukocytes, UA NEGATIVE  NEGATIVE   Comment: MICROSCOPIC NOT DONE ON URINES WITH NEGATIVE PROTEIN, BLOOD, LEUKOCYTES, NITRITE, OR GLUCOSE <1000 mg/dL.  BASIC METABOLIC PANEL     Status: Abnormal   Collection Time    06/04/14  6:17 AM      Result Value Ref Range   Sodium  141  137 - 147 mEq/L   Potassium 4.4  3.7 - 5.3 mEq/L   Chloride 104  96 - 112 mEq/L   CO2 27  19 - 32 mEq/L   Glucose, Bld 170 (*) 70 - 99 mg/dL   BUN 55 (*) 6 - 23 mg/dL   Creatinine, Ser 1.95 (*) 0.50 - 1.35 mg/dL   Calcium 8.4  8.4 - 10.5 mg/dL   GFR calc non Af Amer 29 (*) >90 mL/min   GFR calc Af Amer 33 (*) >90 mL/min   Comment: (NOTE)     The eGFR has been calculated using the CKD EPI equation.     This calculation has not been validated in all clinical situations.     eGFR's persistently <90 mL/min signify possible Chronic Kidney     Disease.   Anion gap 10  5 - 15    ABGS No results found for this basename: PHART, PCO2, PO2ART, TCO2, HCO3,  in the last 72 hours CULTURES No results found for this or any previous visit (from the past 240 hour(s)). Studies/Results: Ct Head Wo Contrast  06/02/2014   CLINICAL DATA:  Seizure activity with subsequent fall in the bathroom; now with generalized weakness; history of previous TIA, Parkinson's disease, as well as coronary artery disease; permanent pacemaker placement  EXAM: CT HEAD WITHOUT CONTRAST  TECHNIQUE: Contiguous axial images were obtained from the base of the skull through the vertex without intravenous contrast.  COMPARISON:  Noncontrast CT scan of the brain of July 06, 2009.  FINDINGS: There is mild diffuse cerebral and cerebellar atrophy. There is moderate ventriculomegaly. There is no intracranial hemorrhage nor intracranial mass effect. There is stable decreased density in the deep white matter of both cerebral hemispheres. There is an old lacunar infarction in the right cerebellar hemisphere. No acute ischemic changes are demonstrated.  There is mucoperiosteal thickening within left ethmoid sinus cells which is stable. There is a retention cyst or polyp in a right sphenoid sinus cell. The mastoid air cells are well pneumatized. There is no acute skull fracture. There is no significant cephalohematoma.  IMPRESSION: 1. There is  no acute ischemic or hemorrhagic event within the brain. 2. There is moderate stable ventriculomegaly with mild diffuse atrophy. There are stable changes of chronic small vessel ischemia. There is an old lacunar infarction in the right cerebellar hemisphere. 3. There is no acute skull fracture.   Electronically Signed   By: David  Martinique   On: 06/02/2014 12:17   Dg Chest [facility]  1 View  06/02/2014   CLINICAL DATA:  Initial encounter for worsening weakness.  EXAM: PORTABLE CHEST - 1 VIEW  COMPARISON:  05/31/2014  FINDINGS: 1105 hrs. The lungs are clear without focal infiltrate, edema, pneumothorax or pleural effusion. The cardio pericardial silhouette is enlarged. Left-sided dual lead permanent pacemaker remains in place. Imaged bony structures of the thorax are intact. Telemetry leads overlie the chest.  IMPRESSION: No acute cardiopulmonary findings.   Electronically Signed   By: Misty Stanley M.D.   On: 06/02/2014 11:30   Dg Foot Complete Right  06/02/2014   CLINICAL DATA:  Initial encounter for Fall due to seizure today. Injury with pain in the great toe.  EXAM: RIGHT FOOT COMPLETE - 3+ VIEW  COMPARISON:  None.  FINDINGS: Bones are diffusely demineralized. Limited evaluation of the tarsometatarsal joints due to positioning. Degenerative changes are noted in the tibiotalar joint. There is also degenerative change in the MTP joint of the great toe. No acute fracture. No subluxation or dislocation. Vascular calcifications suggest diabetes.  IMPRESSION: No acute bony findings.   Electronically Signed   By: Misty Stanley M.D.   On: 06/02/2014 16:18    Medications:  Prior to Admission:  Prescriptions prior to admission  Medication Sig Dispense Refill  . apixaban (ELIQUIS) 2.5 MG TABS tablet Take 1 tablet (2.5 mg total) by mouth 2 (two) times daily.  60 tablet  6  . atorvastatin (LIPITOR) 40 MG tablet Take 1 tablet by mouth daily.      . carbidopa-levodopa (SINEMET) 25-100 MG per tablet Take 1 tablet by mouth  2 (two) times daily.       . chlorthalidone (HYGROTON) 25 MG tablet Take 12.5 mg by mouth daily.      . COMBIGAN 0.2-0.5 % ophthalmic solution Place 1 drop into both eyes daily.       Marland Kitchen donepezil (ARICEPT) 10 MG tablet Take 10 mg by mouth at bedtime.       Marland Kitchen ipratropium (ATROVENT) 0.06 % nasal spray Place 1 spray into both nostrils 4 (four) times daily.       Marland Kitchen lisinopril-hydrochlorothiazide (PRINZIDE,ZESTORETIC) 20-12.5 MG per tablet Take 1 tablet by mouth daily.      Marland Kitchen torsemide (DEMADEX) 100 MG tablet Take 50 mg by mouth daily.       Scheduled: . atorvastatin  40 mg Oral Daily  . brimonidine  1 drop Both Eyes TID  . carbidopa-levodopa  1 tablet Oral BID  . donepezil  10 mg Oral QHS  . ipratropium  1 spray Each Nare QID  . sodium chloride  3 mL Intravenous Q12H  . timolol  1 drop Both Eyes Daily   Continuous: . 0.9 % NaCl with KCl 20 mEq / L 100 mL/hr at 06/03/14 2320   GEX:[id number], acetaminophen, ondansetron (ZOFRAN) IV, ondansetron  Assesment: He was admitted with dehydration and acute on chronic renal insufficiency. He is improving his renal function is now back around baseline. He has multiple other medical problems as noted but appear to be stable Active Problems:   Chronic lymphatic leukemia   CKD (chronic kidney disease) stage 4, GFR 15-29 ml/min   Atrial fibrillation   Aortic insufficiency with aortic stenosis   Weakness   Seizure   Dehydration   Dementia    Plan: I will discontinue his IV fluid replacement continue to hold his diuretics recheck his renal function in the morning and he will probably be discharged tomorrow    LOS: 2 days   Latash Nouri  L 06/04/2014, 9:04 AM

## 2014-06-04 NOTE — Care Management Utilization Note (Signed)
UR completed 

## 2014-06-05 ENCOUNTER — Encounter: Payer: Self-pay | Admitting: Internal Medicine

## 2014-06-05 DIAGNOSIS — R001 Bradycardia, unspecified: Secondary | ICD-10-CM

## 2014-06-05 LAB — BASIC METABOLIC PANEL
ANION GAP: 10 (ref 5–15)
BUN: 46 mg/dL — ABNORMAL HIGH (ref 6–23)
CALCIUM: 8.2 mg/dL — AB (ref 8.4–10.5)
CHLORIDE: 104 meq/L (ref 96–112)
CO2: 24 mEq/L (ref 19–32)
Creatinine, Ser: 1.89 mg/dL — ABNORMAL HIGH (ref 0.50–1.35)
GFR calc non Af Amer: 30 mL/min — ABNORMAL LOW (ref >90)
GFR, EST AFRICAN AMERICAN: 34 mL/min — AB (ref >90)
Glucose, Bld: 172 mg/dL — ABNORMAL HIGH (ref 70–99)
Potassium: 4.3 mEq/L (ref 3.7–5.3)
Sodium: 138 mEq/L (ref 137–147)

## 2014-06-05 MED ORDER — PANTOPRAZOLE SODIUM 40 MG PO TBEC
40.0000 mg | DELAYED_RELEASE_TABLET | Freq: Every day | ORAL | Status: DC
  Administered 2014-06-05: 40 mg via ORAL
  Filled 2014-06-05 (×2): qty 1

## 2014-06-05 MED ORDER — TORSEMIDE 100 MG PO TABS: 50.0000 mg | ORAL_TABLET | Freq: Every day | ORAL | Status: AC

## 2014-06-05 MED ORDER — ALUM & MAG HYDROXIDE-SIMETH 200-200-20 MG/5ML PO SUSP
30.0000 mL | ORAL | Status: DC | PRN
  Administered 2014-06-05: 30 mL via ORAL
  Filled 2014-06-05: qty 30

## 2014-06-05 NOTE — Progress Notes (Signed)
Patient discharged with instructions, prescription, and care notes.  Daughters which is the patients caregiver verbalized understanding via teach back.  IV was removed and the site was WNL. Patient voiced no further complaints or concerns at the time of discharge.  Appointments scheduled per instructions.  Patient left the floor via w/c with staff and family in stable condition.

## 2014-06-05 NOTE — Progress Notes (Signed)
He feels much better. He has not had any issues with symptoms of heart failure. His renal function is improving. He still has edema of both legs. I think he is ready for discharge please see discharge summary for details

## 2014-06-05 NOTE — Progress Notes (Signed)
Patient complaining of chest pain/upper epigastric pain.  Obtained a stat EKG and notified the on call MD of results.  New orders received and carried out.  Patient is currently resting comfortably in his room.

## 2014-06-05 NOTE — Discharge Summary (Signed)
Physician Discharge Summary  Patient ID: GLENDEN ROSSELL MRN: [id number] DOB/AGE: [birth year] 80 y.o. Primary Care Physician:Anzley Dibbern L, MD Admit date: 06/02/2014 Discharge date: 06/05/2014    Discharge Diagnoses:   Active Problems:   Chronic lymphatic leukemia   CKD (chronic kidney disease) stage 4, GFR 15-29 ml/min   Atrial fibrillation   Aortic insufficiency with aortic stenosis   Weakness   Seizure   Dehydration   Dementia  acute on chronic renal failure    Medication List    STOP taking these medications       chlorthalidone 25 MG tablet  Commonly known as:  HYGROTON     lisinopril-hydrochlorothiazide 20-12.5 MG per tablet  Commonly known as:  PRINZIDE,ZESTORETIC      TAKE these medications       apixaban 2.5 MG Tabs tablet  Commonly known as:  ELIQUIS  Take 1 tablet (2.5 mg total) by mouth 2 (two) times daily.     atorvastatin 40 MG tablet  Commonly known as:  LIPITOR  Take 1 tablet by mouth daily.     carbidopa-levodopa 25-100 MG per tablet  Commonly known as:  SINEMET IR  Take 1 tablet by mouth 2 (two) times daily.     COMBIGAN 0.2-0.5 % ophthalmic solution  Generic drug:  brimonidine-timolol  Place 1 drop into both eyes daily.     donepezil 10 MG tablet  Commonly known as:  ARICEPT  Take 10 mg by mouth at bedtime.     ipratropium 0.06 % nasal spray  Commonly known as:  ATROVENT  Place 1 spray into both nostrils 4 (four) times daily.     torsemide 100 MG tablet  Commonly known as:  DEMADEX  Take 0.5 tablets (50 mg total) by mouth daily.        Discharged Condition: Improved    Consults: None  Significant Diagnostic Studies: Ct Abdomen Pelvis Wo Contrast  05/18/2014   CLINICAL DATA:  Abdominal pain.  EXAM: CT ABDOMEN AND PELVIS WITHOUT CONTRAST  TECHNIQUE: Multidetector CT imaging of the abdomen and pelvis was performed following the standard protocol without IV contrast.  COMPARISON:  August 05, 2012.  FINDINGS: Multilevel  degenerative disc disease is noted in the lumbar spine. Mild bilateral pleural effusions are noted with right greater than left.  No gallstones are noted. Stable hepatic cysts are the spleen and pancreas appear normal. Adrenal glands appear normal. No hydronephrosis or renal obstruction is noted. No renal or ureteral calculi are noted. Atherosclerotic calcifications of abdominal aorta are noted without aneurysm formation. The appendix appears normal. There is no evidence of bowel obstruction. Urinary bladder appears normal. Mild bilateral fat containing inguinal hernias are noted. No abnormal fluid collection is noted. Diverticulosis of descending and sigmoid colon is noted without inflammation. No significant adenopathy is noted. Mild inflammatory changes are seen in the soft tissues posterior to the rectum suggesting possible inflammation. Mild wall thickening of the rectum is noted. Stool is noted in the rectum.  IMPRESSION: Diverticulosis of descending and sigmoid colon is noted without inflammation.  Mild bilateral pleural effusions are noted.  Mild wall thickening of the rectum is noted with surrounding inflammatory changes suggesting inflammation of this area.   Electronically Signed   By: [name]   On: 05/18/2014 13:50   Ct Head Wo Contrast  06/02/2014   CLINICAL DATA:  Seizure activity with subsequent fall in the bathroom; now with generalized weakness; history of previous TIA, Parkinson's disease, as well as coronary artery disease; permanent  pacemaker placement  EXAM: CT HEAD WITHOUT CONTRAST  TECHNIQUE: Contiguous axial images were obtained from the base of the skull through the vertex without intravenous contrast.  COMPARISON:  Noncontrast CT scan of the brain of July 06, 2009.  FINDINGS: There is mild diffuse cerebral and cerebellar atrophy. There is moderate ventriculomegaly. There is no intracranial hemorrhage nor intracranial mass effect. There is stable decreased density in the  deep white matter of both cerebral hemispheres. There is an old lacunar infarction in the right cerebellar hemisphere. No acute ischemic changes are demonstrated.  There is mucoperiosteal thickening within left ethmoid sinus cells which is stable. There is a retention cyst or polyp in a right sphenoid sinus cell. The mastoid air cells are well pneumatized. There is no acute skull fracture. There is no significant cephalohematoma.  IMPRESSION: 1. There is no acute ischemic or hemorrhagic event within the brain. 2. There is moderate stable ventriculomegaly with mild diffuse atrophy. There are stable changes of chronic small vessel ischemia. There is an old lacunar infarction in the right cerebellar hemisphere. 3. There is no acute skull fracture.   Electronically Signed   By: David  Martinique   On: 06/02/2014 12:17   Dg Chest Port 1 View  06/02/2014   CLINICAL DATA:  Initial encounter for worsening weakness.  EXAM: PORTABLE CHEST - 1 VIEW  COMPARISON:  05/31/2014  FINDINGS: 1105 hrs. The lungs are clear without focal infiltrate, edema, pneumothorax or pleural effusion. The cardio pericardial silhouette is enlarged. Left-sided dual lead permanent pacemaker remains in place. Imaged bony structures of the thorax are intact. Telemetry leads overlie the chest.  IMPRESSION: No acute cardiopulmonary findings.   Electronically Signed   By: Misty Stanley M.D.   On: 06/02/2014 11:30   Dg Chest Port 1 View  05/31/2014   CLINICAL DATA:  Weakness.  EXAM: PORTABLE CHEST - 1 VIEW  COMPARISON:  05/18/2014  FINDINGS: Pacemaker in place. Heart size and pulmonary vascularity are normal and the lungs are clear. CABG. No acute osseous abnormality. No effusions.  IMPRESSION: No acute disease.   Electronically Signed   By: Rozetta Nunnery M.D.   On: 05/31/2014 13:01   Dg Chest Portable 1 View  05/18/2014   CLINICAL DATA:  Abdominal pain and weakness.  EXAM: PORTABLE CHEST - 1 VIEW  COMPARISON:  08/05/2012  FINDINGS: New cardiomegaly.  Pulmonary vascularity is at the upper limits of normal. Dual lead pacemaker in place. There is loss of the silhouette pole of the left hemidiaphragm which is probably due to left base atelectasis. Right lung is clear. CABG. No acute osseous abnormality.  IMPRESSION: New cardiomegaly.  Slight left base atelectasis.   Electronically Signed   By: Rozetta Nunnery M.D.   On: 05/18/2014 11:59   Dg Foot Complete Right  06/02/2014   CLINICAL DATA:  Initial encounter for Fall due to seizure today. Injury with pain in the great toe.  EXAM: RIGHT FOOT COMPLETE - 3+ VIEW  COMPARISON:  None.  FINDINGS: Bones are diffusely demineralized. Limited evaluation of the tarsometatarsal joints due to positioning. Degenerative changes are noted in the tibiotalar joint. There is also degenerative change in the MTP joint of the great toe. No acute fracture. No subluxation or dislocation. Vascular calcifications suggest diabetes.  IMPRESSION: No acute bony findings.   Electronically Signed   By: Misty Stanley M.D.   On: 06/02/2014 16:18    Lab Results: Basic Metabolic Panel:  Recent Labs  06/02/14 1047 06/03/14 0550 06/04/14 0617 06/05/14  0646  NA 139 141 141 138  K 3.2* 3.9 4.4 4.3  CL 91* 99 104 104  CO2 34* 31 27 24   GLUCOSE 198* 173* 170* 172*  BUN 83* 70* 55* 46*  CREATININE 3.14* 2.32* 1.95* 1.89*  CALCIUM 8.8 8.6 8.4 8.2*  MG  --  2.2  --   --    Liver Function Tests:  Recent Labs  06/02/14 1047  AST 18  ALT 15  ALKPHOS 132*  BILITOT 0.8  PROT 6.5  ALBUMIN 3.2*     CBC:  Recent Labs  06/02/14 1047 06/03/14 0550  WBC 2.7* 3.7*  NEUTROABS 1.5*  --   HGB 9.9* 9.5*  HCT 32.9* 32.3*  MCV 72.1* 72.7*  PLT 85* 101*    No results found for this or any previous visit (from the past 240 hour(s)).   Hospital Course: This is a 80 year old who came to the emergency department with weakness. He was dehydrated and had what appeared to be acute on chronic renal failure. He had been in the hospital  with a similar problem about a week ago. He was treated with IV fluids medications were held. The concern is that he has congestive heart failure as well. He had no symptoms of CHF his renal function improved to a creatinine of approximately 1.9 and he was ready for discharge  Discharge Exam: Blood pressure 118/34, pulse 72, temperature 98.9 F (37.2 C), temperature source Oral, resp. rate 18, height 5\' 6"  (1.676 m), weight 73.437 kg (161 lb 14.4 oz), SpO2 100.00%. He is awake and alert. He is mildly confused. He has edema of both legs which is chronic. His heart is regular and his chest is clear now  Disposition: Home with home health services. He will hold Zestoretic/chlorthalidone and torsemide. He will be weighed daily. If his weight goes up to need to start back on torsemide      Discharge Instructions   Discharge patient    Complete by:  As directed      Face-to-face encounter (required for Medicare/Medicaid patients)    Complete by:  As directed   I Kirra Verga L certify that this patient is under my care and that I, or a nurse practitioner or physician's assistant working with me, had a face-to-face encounte2r that meets the physician face-to-face encounter requirements with this patient on 06/05/2014. The encounter with the patient was in whole, or in part for the following medical condition(s) which is the primary reason for home health care (List medical condition): weakness/renal failure/CHF  The encounter with the patient was in whole, or in part, for the following medical condition, which is the primary reason for home health care:  weakness /renal failure/ CHF  I certify that, based on my findings, the following services are medically necessary home health services:   Nursing Physical therapy    My clinical findings support the need for the above services:  Shortness of breath with activity  Further, I certify that my clinical findings support that this patient is homebound due  to:  Shortness of Breath with activity  Reason for Medically Necessary Home Health Services:  Skilled Nursing- Change/Decline in Patient Status     Home Health    Complete by:  As directed   To provide the following care/treatments:   PT [place]             Follow-up Information   Follow up with [facility]. (They will call  you)    Contact information:   [address] [place] [address] [phone]       Follow up with [facility]. (They will call you)    Contact information:   [place] [address] [phone]       Signed: [name]   06/05/2014, 10:36 AM

## 2014-06-07 LAB — MDC_IDC_ENUM_SESS_TYPE_REMOTE
Battery Remaining Longevity: 83 mo
Battery Voltage: 2.93 V
Brady Statistic AP VP Percent: 1.7 %
Brady Statistic AP VS Percent: 50 %
Brady Statistic AS VP Percent: 1 %
Brady Statistic AS VS Percent: 46 %
Brady Statistic RA Percent Paced: 48 %
Brady Statistic RV Percent Paced: 2.1 %
Date Time Interrogation Session: 20151010214915
Implantable Pulse Generator Model: 2210
Implantable Pulse Generator Serial Number: 7354238
Lead Channel Impedance Value: 410 Ohm
Lead Channel Impedance Value: 410 Ohm
Lead Channel Pacing Threshold Amplitude: 0.75 V
Lead Channel Pacing Threshold Pulse Width: 0.4 ms
Lead Channel Sensing Intrinsic Amplitude: 2.2 mV
Lead Channel Setting Pacing Pulse Width: 0.4 ms
Lead Channel Setting Sensing Sensitivity: 2 mV
MDC IDC MSMT BATTERY REMAINING PERCENTAGE: 67 %
MDC IDC MSMT LEADCHNL RV SENSING INTR AMPL: 12 mV
MDC IDC SET LEADCHNL RA PACING AMPLITUDE: 1.75 V
MDC IDC SET LEADCHNL RV PACING AMPLITUDE: 2.5 V

## 2014-06-07 NOTE — Progress Notes (Signed)
Remote pacemaker transmission.   

## 2014-06-18 ENCOUNTER — Encounter: Payer: Self-pay | Admitting: *Deleted

## 2014-06-22 ENCOUNTER — Other Ambulatory Visit: Payer: Self-pay | Admitting: Internal Medicine

## 2014-06-24 ENCOUNTER — Telehealth: Payer: Self-pay | Admitting: Internal Medicine

## 2014-06-24 DIAGNOSIS — I4891 Unspecified atrial fibrillation: Secondary | ICD-10-CM

## 2014-06-24 MED ORDER — APIXABAN 2.5 MG PO TABS: 2.5000 mg | ORAL_TABLET | Freq: Two times a day (BID) | ORAL | Status: AC

## 2014-06-24 NOTE — Telephone Encounter (Signed)
Received fax refill request  Rx # [id number] Medication:  Eliquis 2.5 mg tablets Qty 60 Sig:  Take one tablet by mouth twice daily Physician:  Lovena Le

## 2014-07-13 ENCOUNTER — Encounter (HOSPITAL_COMMUNITY): Payer: Self-pay | Admitting: Emergency Medicine

## 2014-07-13 ENCOUNTER — Emergency Department (HOSPITAL_COMMUNITY): Payer: Medicare Other

## 2014-07-13 DIAGNOSIS — I129 Hypertensive chronic kidney disease with stage 1 through stage 4 chronic kidney disease, or unspecified chronic kidney disease: Secondary | ICD-10-CM | POA: Diagnosis present

## 2014-07-13 DIAGNOSIS — I4430 Unspecified atrioventricular block: Secondary | ICD-10-CM | POA: Diagnosis present

## 2014-07-13 DIAGNOSIS — Z87891 Personal history of nicotine dependence: Secondary | ICD-10-CM | POA: Diagnosis not present

## 2014-07-13 DIAGNOSIS — I214 Non-ST elevation (NSTEMI) myocardial infarction: Secondary | ICD-10-CM | POA: Diagnosis not present

## 2014-07-13 DIAGNOSIS — Z951 Presence of aortocoronary bypass graft: Secondary | ICD-10-CM

## 2014-07-13 DIAGNOSIS — D649 Anemia, unspecified: Secondary | ICD-10-CM

## 2014-07-13 DIAGNOSIS — Z8673 Personal history of transient ischemic attack (TIA), and cerebral infarction without residual deficits: Secondary | ICD-10-CM

## 2014-07-13 DIAGNOSIS — Z953 Presence of xenogenic heart valve: Secondary | ICD-10-CM | POA: Diagnosis not present

## 2014-07-13 DIAGNOSIS — E785 Hyperlipidemia, unspecified: Secondary | ICD-10-CM | POA: Diagnosis present

## 2014-07-13 DIAGNOSIS — F0390 Unspecified dementia without behavioral disturbance: Secondary | ICD-10-CM | POA: Diagnosis present

## 2014-07-13 DIAGNOSIS — I10 Essential (primary) hypertension: Secondary | ICD-10-CM | POA: Diagnosis present

## 2014-07-13 DIAGNOSIS — Z856 Personal history of leukemia: Secondary | ICD-10-CM

## 2014-07-13 DIAGNOSIS — G20A1 Parkinson's disease without dyskinesia, without mention of fluctuations: Secondary | ICD-10-CM | POA: Diagnosis present

## 2014-07-13 DIAGNOSIS — K219 Gastro-esophageal reflux disease without esophagitis: Secondary | ICD-10-CM | POA: Diagnosis present

## 2014-07-13 DIAGNOSIS — Z91013 Allergy to seafood: Secondary | ICD-10-CM | POA: Diagnosis not present

## 2014-07-13 DIAGNOSIS — Z66 Do not resuscitate: Secondary | ICD-10-CM | POA: Diagnosis present

## 2014-07-13 DIAGNOSIS — G20 Parkinson's disease: Secondary | ICD-10-CM | POA: Diagnosis present

## 2014-07-13 DIAGNOSIS — I4891 Unspecified atrial fibrillation: Secondary | ICD-10-CM | POA: Diagnosis present

## 2014-07-13 DIAGNOSIS — E875 Hyperkalemia: Secondary | ICD-10-CM | POA: Diagnosis present

## 2014-07-13 DIAGNOSIS — I259 Chronic ischemic heart disease, unspecified: Secondary | ICD-10-CM

## 2014-07-13 DIAGNOSIS — I25810 Atherosclerosis of coronary artery bypass graft(s) without angina pectoris: Secondary | ICD-10-CM | POA: Diagnosis present

## 2014-07-13 DIAGNOSIS — N189 Chronic kidney disease, unspecified: Secondary | ICD-10-CM

## 2014-07-13 DIAGNOSIS — Z950 Presence of cardiac pacemaker: Secondary | ICD-10-CM | POA: Diagnosis not present

## 2014-07-13 DIAGNOSIS — H409 Unspecified glaucoma: Secondary | ICD-10-CM | POA: Diagnosis present

## 2014-07-13 DIAGNOSIS — Z803 Family history of malignant neoplasm of breast: Secondary | ICD-10-CM

## 2014-07-13 DIAGNOSIS — I469 Cardiac arrest, cause unspecified: Secondary | ICD-10-CM | POA: Diagnosis present

## 2014-07-13 DIAGNOSIS — Z7901 Long term (current) use of anticoagulants: Secondary | ICD-10-CM

## 2014-07-13 DIAGNOSIS — I739 Peripheral vascular disease, unspecified: Secondary | ICD-10-CM | POA: Diagnosis present

## 2014-07-13 DIAGNOSIS — R531 Weakness: Secondary | ICD-10-CM

## 2014-07-13 DIAGNOSIS — R9431 Abnormal electrocardiogram [ECG] [EKG]: Secondary | ICD-10-CM

## 2014-07-13 DIAGNOSIS — I482 Chronic atrial fibrillation: Secondary | ICD-10-CM

## 2014-07-13 DIAGNOSIS — N184 Chronic kidney disease, stage 4 (severe): Secondary | ICD-10-CM | POA: Diagnosis present

## 2014-07-13 DIAGNOSIS — I483 Typical atrial flutter: Secondary | ICD-10-CM | POA: Diagnosis present

## 2014-07-13 DIAGNOSIS — R569 Unspecified convulsions: Secondary | ICD-10-CM | POA: Diagnosis present

## 2014-07-13 DIAGNOSIS — Z833 Family history of diabetes mellitus: Secondary | ICD-10-CM

## 2014-07-13 DIAGNOSIS — R079 Chest pain, unspecified: Secondary | ICD-10-CM | POA: Diagnosis present

## 2014-07-13 DIAGNOSIS — N179 Acute kidney failure, unspecified: Secondary | ICD-10-CM | POA: Diagnosis present

## 2014-07-13 LAB — URINALYSIS, ROUTINE W REFLEX MICROSCOPIC
Bilirubin Urine: NEGATIVE
Glucose, UA: NEGATIVE mg/dL
KETONES UR: NEGATIVE mg/dL
Leukocytes, UA: NEGATIVE
NITRITE: NEGATIVE
PH: 5 (ref 5.0–8.0)
PROTEIN: 30 mg/dL — AB
Specific Gravity, Urine: 1.025 (ref 1.005–1.030)
Urobilinogen, UA: 0.2 mg/dL (ref 0.0–1.0)

## 2014-07-13 LAB — URINE MICROSCOPIC-ADD ON

## 2014-07-13 LAB — PRO B NATRIURETIC PEPTIDE: PRO B NATRI PEPTIDE: 4099 pg/mL — AB (ref 0–450)

## 2014-07-13 LAB — CBC
HCT: 28 % — ABNORMAL LOW (ref 39.0–52.0)
HEMOGLOBIN: 8.4 g/dL — AB (ref 13.0–17.0)
MCH: 21.6 pg — ABNORMAL LOW (ref 26.0–34.0)
MCHC: 30 g/dL (ref 30.0–36.0)
MCV: 72 fL — ABNORMAL LOW (ref 78.0–100.0)
Platelets: 206 10*3/uL (ref 150–400)
RBC: 3.89 MIL/uL — AB (ref 4.22–5.81)
RDW: 20.2 % — ABNORMAL HIGH (ref 11.5–15.5)
WBC: 5.4 10*3/uL (ref 4.0–10.5)

## 2014-07-13 LAB — TROPONIN I
TROPONIN I: 0.84 ng/mL — AB (ref <0.30)
Troponin I: 0.59 ng/mL (ref <0.30)
Troponin I: 1.21 ng/mL (ref <0.30)

## 2014-07-13 LAB — BASIC METABOLIC PANEL
Anion gap: 21 — ABNORMAL HIGH (ref 5–15)
BUN: 65 mg/dL — AB (ref 6–23)
CHLORIDE: 95 meq/L — AB (ref 96–112)
CO2: 20 mEq/L (ref 19–32)
Calcium: 9.4 mg/dL (ref 8.4–10.5)
Creatinine, Ser: 2.48 mg/dL — ABNORMAL HIGH (ref 0.50–1.35)
GFR calc non Af Amer: 21 mL/min — ABNORMAL LOW (ref >90)
GFR, EST AFRICAN AMERICAN: 25 mL/min — AB (ref >90)
GLUCOSE: 234 mg/dL — AB (ref 70–99)
POTASSIUM: 5.5 meq/L — AB (ref 3.7–5.3)
Sodium: 136 mEq/L — ABNORMAL LOW (ref 137–147)

## 2014-07-13 LAB — MRSA PCR SCREENING: MRSA by PCR: NEGATIVE

## 2014-07-13 LAB — PROTIME-INR
INR: 1.94 — ABNORMAL HIGH (ref 0.00–1.49)
Prothrombin Time: 22.4 seconds — ABNORMAL HIGH (ref 11.6–15.2)

## 2014-07-13 LAB — APTT: aPTT: 34 seconds (ref 24–37)

## 2014-07-13 MED ORDER — HYDROMORPHONE HCL 1 MG/ML IJ SOLN
0.5000 mg | INTRAMUSCULAR | Status: DC | PRN
  Administered 2014-07-14: 0.5 mg via INTRAVENOUS
  Filled 2014-07-13: qty 1

## 2014-07-13 MED ORDER — APIXABAN 2.5 MG PO TABS
ORAL_TABLET | ORAL | Status: AC
  Filled 2014-07-13: qty 1

## 2014-07-13 MED ORDER — ONDANSETRON HCL 4 MG/2ML IJ SOLN: 4.0000 mg | Freq: Four times a day (QID) | INTRAMUSCULAR | Status: DC | PRN

## 2014-07-13 MED ORDER — TIMOLOL MALEATE 0.5 % OP SOLN
1.0000 [drp] | Freq: Every day | OPHTHALMIC | Status: DC
  Filled 2014-07-13: qty 5

## 2014-07-13 MED ORDER — ATORVASTATIN CALCIUM 40 MG PO TABS
40.0000 mg | ORAL_TABLET | Freq: Every day | ORAL | Status: DC
  Administered 2014-07-13: 40 mg via ORAL
  Filled 2014-07-13: qty 1

## 2014-07-13 MED ORDER — ASPIRIN 81 MG PO CHEW
324.0000 mg | CHEWABLE_TABLET | Freq: Once | ORAL | Status: AC
  Administered 2014-07-13: 324 mg via ORAL
  Filled 2014-07-13: qty 4

## 2014-07-13 MED ORDER — APIXABAN 2.5 MG PO TABS
2.5000 mg | ORAL_TABLET | Freq: Two times a day (BID) | ORAL | Status: DC
  Administered 2014-07-13: 2.5 mg via ORAL
  Filled 2014-07-13 (×2): qty 1

## 2014-07-13 MED ORDER — ONDANSETRON HCL 4 MG PO TABS: 4.0000 mg | ORAL_TABLET | Freq: Four times a day (QID) | ORAL | Status: DC | PRN

## 2014-07-13 MED ORDER — CARBIDOPA-LEVODOPA 25-100 MG PO TABS
1.0000 | ORAL_TABLET | Freq: Two times a day (BID) | ORAL | Status: DC
  Administered 2014-07-13: 1 via ORAL
  Filled 2014-07-13: qty 1

## 2014-07-13 MED ORDER — IPRATROPIUM BROMIDE 0.06 % NA SOLN
1.0000 | Freq: Four times a day (QID) | NASAL | Status: DC
  Filled 2014-07-13: qty 15

## 2014-07-13 MED ORDER — METOPROLOL TARTRATE 25 MG PO TABS
25.0000 mg | ORAL_TABLET | Freq: Two times a day (BID) | ORAL | Status: DC
  Administered 2014-07-13: 25 mg via ORAL
  Filled 2014-07-13: qty 1

## 2014-07-13 MED ORDER — SODIUM CHLORIDE 0.9 % IV SOLN: INTRAVENOUS | Status: DC

## 2014-07-13 MED ORDER — ONDANSETRON HCL 4 MG/2ML IJ SOLN: 4.0000 mg | Freq: Three times a day (TID) | INTRAMUSCULAR | Status: DC | PRN

## 2014-07-13 MED ORDER — DONEPEZIL HCL 5 MG PO TABS
10.0000 mg | ORAL_TABLET | Freq: Every day | ORAL | Status: DC
  Administered 2014-07-13: 10 mg via ORAL
  Filled 2014-07-13: qty 2

## 2014-07-13 MED ORDER — SODIUM CHLORIDE 0.9 % IJ SOLN
3.0000 mL | Freq: Two times a day (BID) | INTRAMUSCULAR | Status: DC
Start: 2014-07-13 — End: 2014-07-14

## 2014-07-13 MED ORDER — BRIMONIDINE TARTRATE 0.2 % OP SOLN
1.0000 [drp] | Freq: Every day | OPHTHALMIC | Status: DC
  Filled 2014-07-13: qty 5

## 2014-07-13 MED ORDER — NITROGLYCERIN 2 % TD OINT
1.0000 [in_us] | TOPICAL_OINTMENT | Freq: Four times a day (QID) | TRANSDERMAL | Status: DC
  Administered 2014-07-13: 1 [in_us] via TOPICAL
  Filled 2014-07-13: qty 1

## 2014-07-13 MED ORDER — BRIMONIDINE TARTRATE-TIMOLOL 0.2-0.5 % OP SOLN: 1.0000 [drp] | Freq: Every day | OPHTHALMIC | Status: DC

## 2014-07-13 NOTE — ED Notes (Signed)
Lab at bedside for 1600 troponin.

## 2014-07-13 NOTE — ED Provider Notes (Signed)
CSN: [id number]     Arrival date & time 07/08/2014  1216 History  This chart was scribed for Janice Norrie, MD by Edison Simon, ED Scribe. This patient was seen in room [facility] and the patient's care was started at 1:18 PM.    Chief Complaint  Patient presents with  . Weakness   The history is provided by the patient. The history is limited by the condition of the patient. No language interpreter was used.   LEVEL V CAVEAT-AGE  HPI Comments: [name] is a 80 y.o. male with history of dementia, TIA, peripheral vascular disease, Parkinson disease, hepatitis, and CAD who presents to the Emergency Department complaining of generalized weakness with onset yesterday. He lives at home alone, but has extended family in and out of his house all day and somebody always with him at night. Per daughter, he was was up and dressed yesterday in the morning and had breakfast, but she returned at 1100 and found him undressed and back in bed. She states he was in bed most of the day and was less active than he normally is, which he attributed to not sleeping well the night before. She also reports that he did not eat as well as usual, sometimes breathed rapidly (which she suspected was due to anxiety), complained of left shoulder pain briefly, and noted a tremor. She states he occasionally has a slight tremor, but it was greater than usual today. Today, she states that he walked a short distance from the house to go to the car for a doctors appointment with Dr Lowanda Foster and then became weak and had dry heaves followed by vomiting. He was instructed to see his doctor today, and his family suspects it was due to abnormal lab results since he had blood taken there recently. She states he is normally able to express pain or other complaints. He is not on O2 at home. His family notes prior CABG 17 years ago and pacemaker implant.  PCP Dr Luan Pulling Cardiologist Dr Lovena Le  Past Medical History  Diagnosis Date  .  Arteriosclerotic cardiovascular disease (ASCVD)     CABG & bioprosthetic aortic valve replace in 1999. PCI of right coronary in 2000, catheter in 07/2002 revealed total occlusion of the LAD; LIMA-LAD patent; patent SVG-OM1, SVG-diagonal ramus, PDA occluded; patent Cx stent, distal 30-40% stenosis Cx; nondom RCA, prox RCA 20-30% lesion, distal RCA 90% lesion; normal EF  . TIA (transient ischemic attack)   . Hypertension     normal CMet and 2011  . Hyperlipidemia     Lipid profile in 12/2009-170, 73, 79, 76  . PVD (peripheral vascular disease)   . Parkinson disease 2010    mild; + dementia  . History of sinus bradycardia     induced by beta blocker  . History of syncope   . Chronic lymphatic leukemia     mild pancytopenia in 02/2010;hemoglobin-12.9, WBC-3.3, platelets-109  . Hepatitis   . Glaucoma 09/1994  . Tobacco abuse, in remission     20 pack years; discontinued 50 years ago  . Insomnia     Middle of the night awakening  . Coronary artery disease   . Dysrhythmia     Bradycardia  . Shortness of breath   . GERD (gastroesophageal reflux disease)   . Dementia    Past Surgical History  Procedure Laterality Date  . Retinal detachment surgery  1989  . Tonsillectomy    . Transurethral resection of prostate  Benign prostatic hypertrophy  . Coronary artery bypass graft  1999    + Bioprosthetic AVR  . Colonoscopy  2011  . A-v cardiac pacemaker insertion  02/2012    [employer] Accent DR RF   Family History  Problem Relation Age of Onset  . Pneumonia Father   . Cancer Brother   . Breast cancer Sister   . Diabetes Sister     complications   History  Substance Use Topics  . Smoking status: Former Research scientist (life sciences)  . Smokeless tobacco: Never Used  . Alcohol Use: No     Comment: rare  live at home but has help throughout the day with family Uses a walker  Review of Systems  Unable to perform ROS: Age  Constitutional: Positive for activity change and appetite change.  Respiratory:  Positive for shortness of breath.   Gastrointestinal: Positive for vomiting.  Neurological: Positive for tremors and weakness.      Allergies  Namenda; Shellfish allergy; and Ambien  Home Medications   Prior to Admission medications   Medication Sig Start Date End Date Taking? Authorizing Provider  apixaban (ELIQUIS) 2.5 MG TABS tablet Take 1 tablet (2.5 mg total) by mouth 2 (two) times daily. 06/24/14  Yes Evans Lance, MD  atorvastatin (LIPITOR) 40 MG tablet Take 1 tablet by mouth daily. 03/18/14   Historical Provider, MD  carbidopa-levodopa (SINEMET) 25-100 MG per tablet Take 1 tablet by mouth 2 (two) times daily.  02/08/11   Historical Provider, MD  COMBIGAN 0.2-0.5 % ophthalmic solution Place 1 drop into both eyes daily.  08/09/11   Historical Provider, MD  donepezil (ARICEPT) 10 MG tablet Take 10 mg by mouth at bedtime.  02/23/11   Historical Provider, MD  ipratropium (ATROVENT) 0.06 % nasal spray Place 1 spray into both nostrils 4 (four) times daily.  12/11/12   Historical Provider, MD  potassium chloride (K-DUR) 10 MEQ tablet Take 10 mEq by mouth daily. 06/22/14   Historical Provider, MD  torsemide (DEMADEX) 100 MG tablet Take 0.5 tablets (50 mg total) by mouth daily. 06/05/14   [name], MD   BP 132/54 mmHg  Pulse 98  Temp(Src) 97.4 F (36.3 C) (Rectal)  Resp 23  Ht 5\' 5"  (1.651 m)  Wt 170 lb (77.111 kg)  BMI 28.29 kg/m2  SpO2 91% Physical Exam  Constitutional: He is oriented to person, place, and time. He appears well-developed and well-nourished.  Non-toxic appearance. He does not appear ill. No distress.  Is quiet and not very verbal.  HENT:  Head: Normocephalic and atraumatic.  Right Ear: External ear normal.  Left Ear: External ear normal.  Nose: Nose normal. No mucosal edema or rhinorrhea.  Mouth/Throat: Oropharynx is clear and moist and mucous membranes are normal. No dental abscesses or uvula swelling.  Wearing hearing aid  Eyes: Conjunctivae and EOM  are normal. Pupils are equal, round, and reactive to light.  Neck: Normal range of motion and full passive range of motion without pain. Neck supple.  Cardiovascular: Normal rate and regular rhythm.  Exam reveals no gallop and no friction rub.   Murmur (rubbery systolic murmur, heard best in the left upper sternal border) heard. Pulmonary/Chest: Effort normal and breath sounds normal. No respiratory distress. He has no wheezes. He has no rhonchi. He has no rales. He exhibits no tenderness and no crepitus.  Abdominal: Soft. Normal appearance and bowel sounds are normal. He exhibits no distension. There is no tenderness. There is no rebound and no guarding.  Musculoskeletal: Normal range of motion. He exhibits no edema or tenderness.  Moves all extremities well.   Neurological: He is alert and oriented to person, place, and time. He has normal strength. No cranial nerve deficit.  Strong grip strength. No pronator drift. No weakness in lower extremities.  Skin: Skin is warm, dry and intact. No rash noted. No erythema. No pallor.  Psychiatric: He has a normal mood and affect. His speech is normal and behavior is normal. His mood appears not anxious.  Nursing note and vitals reviewed.   ED Course  Procedures (including critical care time)  Medications  aspirin chewable tablet 324 mg (not administered)     DIAGNOSTIC STUDIES: Oxygen Saturation is 95% on nasal canula, adequate by my interpretation.    COORDINATION OF CARE: 1:30 PM Discussed treatment plan with patient at beside, the patient agrees with the plan and has no further questions at this time.  Patient not started on heparin because he is are on eliquis.  14:43 Dr Jacinta Shoe states he agrees with second troponin and if it is rapidly rising would suggest going to [facility], if only mild elevation could stay at [facility] and have consult tomorrow. States with his renal insufficiency would be reluctant to do cardiac cath.   Pt continues to deny  having chest pain, shortness of breath. He is laying flat in his stretcher in NAD.   17:55 Dr Anastasio Champion, admit to step down, Dr Luan Pulling attending  Labs Review Results for orders placed or performed during the hospital encounter of 07/24/2014  CBC  (at [facility])  Result Value Ref Range   WBC 5.4 4.0 - 10.5 K/uL   RBC 3.89 (L) 4.22 - 5.81 MIL/uL   Hemoglobin 8.4 (L) 13.0 - 17.0 g/dL   HCT 28.0 (L) 39.0 - 52.0 %   MCV 72.0 (L) 78.0 - 100.0 fL   MCH 21.6 (L) 26.0 - 34.0 pg   MCHC 30.0 30.0 - 36.0 g/dL   RDW 20.2 (H) 11.5 - 15.5 %   Platelets 206 150 - 400 K/uL  Basic metabolic panel  (at [facility])  Result Value Ref Range   Sodium 136 (L) 137 - 147 mEq/L   Potassium 5.5 (H) 3.7 - 5.3 mEq/L   Chloride 95 (L) 96 - 112 mEq/L   CO2 20 19 - 32 mEq/L   Glucose, Bld 234 (H) 70 - 99 mg/dL   BUN 65 (H) 6 - 23 mg/dL   Creatinine, Ser 2.48 (H) 0.50 - 1.35 mg/dL   Calcium 9.4 8.4 - 10.5 mg/dL   GFR calc non Af Amer 21 (L) >90 mL/min   GFR calc Af Amer 25 (L) >90 mL/min   Anion gap 21 (H) 5 - 15  Troponin I  Result Value Ref Range   Troponin I 0.59 (HH) <0.30 ng/mL  Urinalysis, Routine w reflex microscopic  Result Value Ref Range   Color, Urine YELLOW YELLOW   APPearance CLEAR CLEAR   Specific Gravity, Urine 1.025 1.005 - 1.030   pH 5.0 5.0 - 8.0   Glucose, UA NEGATIVE NEGATIVE mg/dL   Hgb urine dipstick LARGE (A) NEGATIVE   Bilirubin Urine NEGATIVE NEGATIVE   Ketones, ur NEGATIVE NEGATIVE mg/dL   Protein, ur 30 (A) NEGATIVE mg/dL   Urobilinogen, UA 0.2 0.0 - 1.0 mg/dL   Nitrite NEGATIVE NEGATIVE   Leukocytes, UA NEGATIVE NEGATIVE  Pro b natriuretic peptide (BNP)  Result Value Ref Range   Pro B Natriuretic peptide (BNP)  4099.0 (H) 0 - 450 pg/mL  Protime-INR  Result Value Ref Range   Prothrombin Time 22.4 (H) 11.6 - 15.2 seconds   INR 1.94 (H) 0.00 - 1.49  APTT  Result Value Ref Range   aPTT 34 24 - 37 seconds  Urine microscopic-add on  Result Value Ref Range    Squamous Epithelial / LPF RARE RARE   WBC, UA 0-2 <3 WBC/hpf   RBC / HPF 11-20 <3 RBC/hpf   Bacteria, UA MANY (A) RARE   Casts HYALINE CASTS (A) NEGATIVE   Urine-Other AMORPHOUS URATES/PHOSPHATES   Troponin I  Result Value Ref Range   Troponin I 1.21 (HH) <0.30 ng/mL   Laboratory interpretation all normal except stable chronic renal insufficiency, positive troponin (several troponins in October were normal), second troponin is rising, stable anemia for the past few months,stable elevation of BNP     Imaging Review Dg Chest 1 View  07/07/2014   CLINICAL DATA:  80 year old male with left upper extremity pain weakness and vomiting. Initial encounter.  EXAM: CHEST - 1 VIEW  COMPARISON:  06/02/2014 and earlier.  FINDINGS: Portable AP upright view at 1403 hrs. Stable cardiomegaly and mediastinal contours. Sequelae of CABG. Stable left chest cardiac pacemaker. Allowing for portable technique, the lungs are clear. No pneumothorax or effusion.  IMPRESSION: Stable cardiomegaly. No acute cardiopulmonary abnormality.   Electronically Signed   By: Lars Pinks M.D.   On: 07/12/2014 14:33   Ct Head Wo Contrast  07/18/2014   CLINICAL DATA:  80 year old male with left upper extremity weakness and vomiting. Initial encounter.  EXAM: CT HEAD WITHOUT CONTRAST  TECHNIQUE: Contiguous axial images were obtained from the base of the skull through the vertex without intravenous contrast.  COMPARISON:  06/02/2014 and earlier.  FINDINGS: Stable and largely clear paranasal sinuses and mastoids. No acute osseous abnormality identified. No acute orbit or scalp soft tissue findings identified.  Calcified atherosclerosis at the skull base. Mild cerebral volume loss since 2010. Chronic ventricular prominence, stable. No midline shift, mass effect, or evidence of intracranial mass lesion. Confluent cerebral white matter hypodensity is stable. Chronic lacunar infarct in the right cerebellum is stable. No evidence of cortically  based acute infarction identified. No acute intracranial hemorrhage identified. No suspicious intracranial vascular hyperdensity.  IMPRESSION: No acute intracranial abnormality.  Stable volume loss, ventriculomegaly, and small/medium-sized vessel ischemic changes.   Electronically Signed   By: Lars Pinks M.D.   On: 07/15/2014 14:50     EKG Interpretation   Date/Time:  Tuesday July 13 2014 12:30:18 EST Ventricular Rate:  98 PR Interval:    QRS Duration: 114 QT Interval:  366 QTC Calculation: 467 R Axis:   -64 Text Interpretation:  indeterminate rhythm Left anterior fascicular block  Anteroseptal infarct, old Confirmed by Christy Gentles  MD, [place] ([id number]) on  07/06/2014 12:33:18 PM  Since last tracing 06/10/14 pt now has diffuse ST depression in inf-lateral leads    #2  Date: 07/21/2014  Rate: 102   Rhythm: atrial flutter  QRS Axis: normal  Intervals: normal  ST/T Wave abnormalities: ST depressions inferiorly and ST depressions laterally  Conduction Disutrbances:nonspecific intraventricular conduction delay  Narrative Interpretation:   Old EKG Reviewed: unchanged from earlier today         MDM   Final diagnoses:  Weakness  Abnormal EKG  Myocardial ischemia  NSTEMI (non-ST elevated myocardial infarction)  Chronic renal failure, unspecified stage  Anemia, unspecified anemia type   Plan admission   Rolland Porter, MD, Abram Sander  I personally performed the services described in this documentation, which was scribed in my presence. The recorded information has been reviewed and considered.  Rolland Porter, MD, FACEP   Janice Norrie, MD 07/13/2014 [phone]

## 2014-07-13 NOTE — ED Notes (Signed)
EDP at bedside updated pt and pt family. EDP reported pt could have something to drink. Pt family gave pt some juice. Pt vomited x1 after oral intake. Pt denies nausea at this time.

## 2014-07-13 NOTE — ED Notes (Signed)
CRITICAL VALUE ALERT  Critical value received:  Troponin 1.21  Date of notification:  11/17  Time of notification:  1708  Critical value read back:Yes.    Nurse who received alert:  t Lisia [place] rn  MD notified (1st page): I Knapp  Time of first page:    MD notified (2nd page):  Time of second page:  Responding MD:    Time MD responded:

## 2014-07-13 NOTE — ED Notes (Signed)
Per EMS, pt scheduled to go to nephrologist this am but told granddaughter that he felt weak. Pt vomited x1 this am prior to arrival. Pt reports generalized weakness and LUE pain at time of arrival.

## 2014-07-13 NOTE — ED Notes (Addendum)
Pt family came out to nurses station reporting pt "peeing blood." Upon assessment, pt is passing small clots. Pericare provided and linen changed. No current active bleeding noted. EDP aware and reported would get better with time. Pt and pt family aware. no new orders given.

## 2014-07-13 NOTE — ED Notes (Signed)
CRITICAL VALUE ALERT  Critical value received:  Troponin 0.59  Date of notification: 06/12/14  Time of notification:  3888  Critical value read back: yes  Nurse who received alert: j. Jerl Santos  MD notified (1st page):  i knapp  Time of first page:  1405  MD notified (2nd page):  Time of second page:  Responding MD:  i knapp  Time MD responded:  1407

## 2014-07-13 NOTE — ED Notes (Signed)
Pt reported had to urinate. Pt given urinal,small amount of bloody discharge noted for initial stream. nad noted. Pericare provided and incontinence pad changed. Pt tolerated well.No remaining or active bleeding/discharge noted.

## 2014-07-13 NOTE — ED Notes (Signed)
Toileting offered, urinal provided several times. Pt not able to void. EDP reported to obtain in and out if pt not able to void on his own. NT with RN at bedside attempted in and out cath. Could not advance catheter past prostate. Coude catheter used and urine sample obtained.

## 2014-07-13 NOTE — H&P (Signed)
[facility] History and Physical  DONIVIN WIRT EZM:[id number] DOB: 1920-10-09 DOA: 07/09/2014  Referring physician: ER PCP: [name], MD   Chief Complaint: chest pain  HPI: [name] is a 80 y.o. male  This is a 80 year old demented, history of Parkinson's disease, history of atrial fibrillation on chronic anticoagulation therapy with liquids, now presents with chest pain and weakness. Evaluation in the emergency room shows him to have ST depression in the inferior leads together with elevated troponin levels. Cardiology has been consulted who recommends that if his troponin levels are not significantly high then he can remain at this hospital for conservative treatment. The patient currently appears not to be in severe pain. He is not dyspneic. Due to his dementia, he cannot give a clear history. History is obtained mostly from the daughter.he apparently had some chest and shoulder pain yesterday. He has been feeling weak today with nausea. He does have a history of CABG 17 years ago as well as pacemaker placement.he has a history of hypertension, transient ischemic attack, CLL, Parkinson's disease and dementia as mentioned above. He is now being admitted for management.   Review of Systems:  Constitutional:  No weight loss, night sweats, Fevers, chills, fatigue.  HEENT:  No headaches, Difficulty swallowing,Tooth/dental problems,Sore throat,  No sneezing, itching, ear ache, nasal congestion, post nasal drip,   GI:  No heartburn, indigestion, abdominal pain, nausea, vomiting, diarrhea, change in bowel habits, loss of appetite  Resp:  No shortness of breath with exertion or at rest. No excess mucus, no productive cough, No non-productive cough, No coughing up of blood.No change in color of mucus.No wheezing.No chest wall deformity  Skin:  no rash or lesions.  GU:  no dysuria, change in color of urine, no urgency or frequency. No flank pain.  Musculoskeletal:    No joint pain or swelling. No decreased range of motion. No back pain.  Psych:  No change in mood or affect. No depression or anxiety. No memory loss.   Past Medical History  Diagnosis Date  . Arteriosclerotic cardiovascular disease (ASCVD)     CABG & bioprosthetic aortic valve replace in 1999. PCI of right coronary in 2000, catheter in 07/2002 revealed total occlusion of the LAD; LIMA-LAD patent; patent SVG-OM1, SVG-diagonal ramus, PDA occluded; patent Cx stent, distal 30-40% stenosis Cx; nondom RCA, prox RCA 20-30% lesion, distal RCA 90% lesion; normal EF  . TIA (transient ischemic attack)   . Hypertension     normal CMet and 2011  . Hyperlipidemia     Lipid profile in 12/2009-170, 73, 79, 76  . PVD (peripheral vascular disease)   . Parkinson disease 2010    mild; + dementia  . History of sinus bradycardia     induced by beta blocker  . History of syncope   . Chronic lymphatic leukemia     mild pancytopenia in 02/2010;hemoglobin-12.9, WBC-3.3, platelets-109  . Hepatitis   . Glaucoma 09/1994  . Tobacco abuse, in remission     20 pack years; discontinued 50 years ago  . Insomnia     Middle of the night awakening  . Coronary artery disease   . Dysrhythmia     Bradycardia  . Shortness of breath   . GERD (gastroesophageal reflux disease)   . Dementia    Past Surgical History  Procedure Laterality Date  . Retinal detachment surgery  1989  . Tonsillectomy    . Transurethral resection of prostate      Benign prostatic hypertrophy  .  Coronary artery bypass graft  1999    + Bioprosthetic AVR  . Colonoscopy  2011  . A-v cardiac pacemaker insertion  02/2012    St. Jude Accent DR RF   Social History:  reports that he has quit smoking. He has never used smokeless tobacco. He reports that he does not drink alcohol or use illicit drugs.  Allergies  Allergen Reactions  . Namenda [Memantine Hcl] Other (See Comments)    Altered mental status, agitation  . Shellfish Allergy  Anaphylaxis  . Ambien [Zolpidem Tartrate]     Family History  Problem Relation Age of Onset  . Pneumonia Father   . Cancer Brother   . Breast cancer Sister   . Diabetes Sister     complications     Prior to Admission medications   Medication Sig Start Date End Date Taking? Authorizing Provider  apixaban (ELIQUIS) 2.5 MG TABS tablet Take 1 tablet (2.5 mg total) by mouth 2 (two) times daily. 06/24/14  Yes Evans Lance, MD  atorvastatin (LIPITOR) 40 MG tablet Take 1 tablet by mouth daily. 03/18/14   Historical Provider, MD  carbidopa-levodopa (SINEMET) 25-100 MG per tablet Take 1 tablet by mouth 2 (two) times daily.  02/08/11   Historical Provider, MD  COMBIGAN 0.2-0.5 % ophthalmic solution Place 1 drop into both eyes daily.  08/09/11   Historical Provider, MD  donepezil (ARICEPT) 10 MG tablet Take 10 mg by mouth at bedtime.  02/23/11   Historical Provider, MD  ipratropium (ATROVENT) 0.06 % nasal spray Place 1 spray into both nostrils 4 (four) times daily.  12/11/12   Historical Provider, MD  potassium chloride (K-DUR) 10 MEQ tablet Take 10 mEq by mouth daily. 06/22/14   Historical Provider, MD  torsemide (DEMADEX) 100 MG tablet Take 0.5 tablets (50 mg total) by mouth daily. 06/05/14   [name], MD   Physical Exam: Filed Vitals:   07/03/2014 1630 06/30/2014 1704 07/18/2014 1731 07/24/2014 1800  BP: 143/53 91/41 132/54 127/55  Pulse: 126 50 98 93  Temp:      TempSrc:      Resp: 35  23 30  Height:      Weight:      SpO2:   91% 93%    Wt Readings from Last 3 Encounters:  07/01/2014 77.111 kg (170 lb)  06/02/14 73.437 kg (161 lb 14.4 oz)  05/31/14 83.008 kg (183 lb)    General:  Appears calm and comfortable. Does not appear to be in pain. Eyes: PERRL, normal lids, irises & conjunctiva ENT: grossly normal hearing, lips & tongue Neck: no LAD, masses or thyromegaly Cardiovascular: heart sounds are irregularly irregular, consistent with atrial fibrillation. Telemetry: atrial  fibrillation. Respiratory: CTA bilaterally, no w/r/r. Normal respiratory effort. Abdomen: soft, ntnd Skin: no rash or induration seen on limited exam Musculoskeletal: grossly normal tone BUE/BLE  Neurologic: grossly non-focal.          Labs on Admission:  Basic Metabolic Panel:  Recent Labs Lab 07/07/2014 1245  NA 136*  K 5.5*  CL 95*  CO2 20  GLUCOSE 234*  BUN 65*  CREATININE 2.48*  CALCIUM 9.4   Liver Function Tests: No results for input(s): AST, ALT, ALKPHOS, BILITOT, PROT, ALBUMIN in the last 168 hours. No results for input(s): LIPASE, AMYLASE in the last 168 hours. No results for input(s): AMMONIA in the last 168 hours. CBC:  Recent Labs Lab 07/21/2014 1245  WBC 5.4  HGB 8.4*  HCT 28.0*  MCV 72.0*  PLT 206   Cardiac Enzymes:  Recent Labs Lab 07/10/2014 1245 07/10/2014 1604  TROPONINI 0.59* 1.21*    BNP (last 3 results)  Recent Labs  05/18/14 1149 07/21/2014 1245  PROBNP 4996.0* 4099.0*   CBG: No results for input(s): GLUCAP in the last 168 hours.  Radiological Exams on Admission: Dg Chest 1 View  07/24/2014   CLINICAL DATA:  80 year old male with left upper extremity pain weakness and vomiting. Initial encounter.  EXAM: CHEST - 1 VIEW  COMPARISON:  06/02/2014 and earlier.  FINDINGS: Portable AP upright view at 1403 hrs. Stable cardiomegaly and mediastinal contours. Sequelae of CABG. Stable left chest cardiac pacemaker. Allowing for portable technique, the lungs are clear. No pneumothorax or effusion.  IMPRESSION: Stable cardiomegaly. No acute cardiopulmonary abnormality.   Electronically Signed   By: Lars Pinks M.D.   On: 07/25/2014 14:33   Ct Head Wo Contrast  06/30/2014   CLINICAL DATA:  80 year old male with left upper extremity weakness and vomiting. Initial encounter.  EXAM: CT HEAD WITHOUT CONTRAST  TECHNIQUE: Contiguous axial images were obtained from the base of the skull through the vertex without intravenous contrast.  COMPARISON:  06/02/2014 and  earlier.  FINDINGS: Stable and largely clear paranasal sinuses and mastoids. No acute osseous abnormality identified. No acute orbit or scalp soft tissue findings identified.  Calcified atherosclerosis at the skull base. Mild cerebral volume loss since 2010. Chronic ventricular prominence, stable. No midline shift, mass effect, or evidence of intracranial mass lesion. Confluent cerebral white matter hypodensity is stable. Chronic lacunar infarct in the right cerebellum is stable. No evidence of cortically based acute infarction identified. No acute intracranial hemorrhage identified. No suspicious intracranial vascular hyperdensity.  IMPRESSION: No acute intracranial abnormality.  Stable volume loss, ventriculomegaly, and small/medium-sized vessel ischemic changes.   Electronically Signed   By: Lars Pinks M.D.   On: 07/08/2014 14:50    EKG: Independently reviewed. Atrial fibrillation with ST segment depression inferiorly.  Assessment/Plan   1. Non-ST elevation myocardial infarction. 2. Atrial fibrillation with slightly elevated ventricular response. 3. Hypertension. 4. Parkinson's disease. 5. Dementia. 6. Chronic kidney disease with a degree of acute renal failure with hyperkalemia. 7. Status post CABG 17 years ago and status post pacemaker implant.  Plan: 1. Admit to stepdown unit. 2. Nitropaste. 3. Beta- blocker. 4. Continue with Eliquis for anticoagulation. 5. Analgesia as required. 6. Follow cardiac enzymes. 7. Cardiology consultation. 8. Nephrology consultation.  Further recommendations will depend on patient's hospital progress.   Code Status: DO NOT RESUSCITATE. This was confirmed with the patient's daughter at the bedside.   Family Communication: I discussed at length the diagnosis and possible outcome with the patient's daughter at the bedside. We have agreed for conservative management rather than aggressive management at this point in the patient's life.   Disposition Plan:  depending on progress.   Time spent: 60 minutes.  Doree Albee [facility] Pager [phone].

## 2014-07-14 LAB — URINE CULTURE
Colony Count: NO GROWTH
Culture: NO GROWTH

## 2014-07-14 LAB — TROPONIN I: Troponin I: 2.05 ng/mL (ref <0.30)

## 2014-07-14 MED ORDER — NALOXONE HCL 0.4 MG/ML IJ SOLN
INTRAMUSCULAR | Status: AC
  Filled 2014-07-14: qty 1

## 2014-07-14 MED ORDER — NALOXONE HCL 0.4 MG/ML IJ SOLN
0.4000 mg | Freq: Once | INTRAMUSCULAR | Status: AC
  Administered 2014-07-14: 0.4 mg via INTRAVENOUS

## 2014-07-21 NOTE — Discharge Summary (Signed)
Physician Discharge Summary  Patient ID: [name] MRN: [id number] DOB/AGE: [birth year] 78 y.o. Primary Care Physician:Fizza Scales L, MD Admit date: 07/02/2014 Discharge date: 07/21/2014    Discharge Diagnoses:   Active Problems:   Parkinson disease   Hypertension   Pacemaker-St.Jude   CKD (chronic kidney disease) stage 4, GFR 15-29 ml/min   Atrial fibrillation   Dementia   NSTEMI (non-ST elevated myocardial infarction)   Non-ST elevation MI (NSTEMI)  seizure Cardiorespiratory arrest    Medication List    ASK your doctor about these medications        apixaban 2.5 MG Tabs tablet  Commonly known as:  ELIQUIS  Take 1 tablet (2.5 mg total) by mouth 2 (two) times daily.     atorvastatin 40 MG tablet  Commonly known as:  LIPITOR  Take 1 tablet by mouth every evening.     carbidopa-levodopa 25-100 MG per tablet  Commonly known as:  SINEMET IR  Take 1 tablet by mouth 2 (two) times daily.     COMBIGAN 0.2-0.5 % ophthalmic solution  Generic drug:  brimonidine-timolol  Place 1 drop into both eyes daily.     donepezil 10 MG tablet  Commonly known as:  ARICEPT  Take 10 mg by mouth at bedtime.     ipratropium 0.06 % nasal spray  Commonly known as:  ATROVENT  Place 1 spray into both nostrils 4 (four) times daily as needed for rhinitis.     potassium chloride 10 MEQ tablet  Commonly known as:  K-DUR  Take 10 mEq by mouth daily.     torsemide 100 MG tablet  Commonly known as:  DEMADEX  Take 0.5 tablets (50 mg total) by mouth daily.        Discharged Condition: Deceased    Consults: None  Significant Diagnostic Studies: Dg Chest 1 View  07/03/2014   CLINICAL DATA:  80 year old male with left upper extremity pain weakness and vomiting. Initial encounter.  EXAM: CHEST - 1 VIEW  COMPARISON:  06/02/2014 and earlier.  FINDINGS: Portable AP upright view at 1403 hrs. Stable cardiomegaly and mediastinal contours. Sequelae of CABG. Stable left chest cardiac  pacemaker. Allowing for portable technique, the lungs are clear. No pneumothorax or effusion.  IMPRESSION: Stable cardiomegaly. No acute cardiopulmonary abnormality.   Electronically Signed   By: Lars Pinks M.D.   On: 07/01/2014 14:33   Ct Head Wo Contrast  07/10/2014   CLINICAL DATA:  80 year old male with left upper extremity weakness and vomiting. Initial encounter.  EXAM: CT HEAD WITHOUT CONTRAST  TECHNIQUE: Contiguous axial images were obtained from the base of the skull through the vertex without intravenous contrast.  COMPARISON:  06/02/2014 and earlier.  FINDINGS: Stable and largely clear paranasal sinuses and mastoids. No acute osseous abnormality identified. No acute orbit or scalp soft tissue findings identified.  Calcified atherosclerosis at the skull base. Mild cerebral volume loss since 2010. Chronic ventricular prominence, stable. No midline shift, mass effect, or evidence of intracranial mass lesion. Confluent cerebral white matter hypodensity is stable. Chronic lacunar infarct in the right cerebellum is stable. No evidence of cortically based acute infarction identified. No acute intracranial hemorrhage identified. No suspicious intracranial vascular hyperdensity.  IMPRESSION: No acute intracranial abnormality.  Stable volume loss, ventriculomegaly, and small/medium-sized vessel ischemic changes.   Electronically Signed   By: Lars Pinks M.D.   On: 07/07/2014 14:50    Lab Results: Basic Metabolic Panel: No results for input(s): NA, K, CL, CO2, GLUCOSE, BUN, CREATININE, CALCIUM, MG,  PHOS in the last 72 hours. Liver Function Tests: No results for input(s): AST, ALT, ALKPHOS, BILITOT, PROT, ALBUMIN in the last 72 hours.   CBC: No results for input(s): WBC, NEUTROABS, HGB, HCT, MCV, PLT in the last 72 hours.  Recent Results (from the past 240 hour(s))  Urine culture     Status: None   Collection Time: 07/16/2014  2:58 PM  Result Value Ref Range Status   Specimen Description URINE,  CATHETERIZED  Final   Special Requests NONE  Final   Culture  Setup Time   Final    08-04-2014 01:23 Performed at [place] Performed at [facility]   Final   Culture NO GROWTH Performed at [facility]   Final   Report Status 08/04/14 FINAL  Final  MRSA PCR Screening     Status: None   Collection Time: 07/04/2014  6:58 PM  Result Value Ref Range Status   MRSA by PCR NEGATIVE NEGATIVE Final    Comment:        The GeneXpert MRSA Assay (FDA approved for NASAL specimens only), is one component of a comprehensive MRSA colonization surveillance program. It is not intended to diagnose MRSA infection nor to guide or monitor treatment for MRSA infections.      Hospital Course: This was a 80 year old who came to the emergency department with chest discomfort and was found to have a non-STEMI. He was having chest discomfort. Cardiology consultation by telephone was felt that it was okay to keep him here with conservative treatment. Discussion with family revealed that they did not want to have aggressive treatment. He had no CODE BLUE status. He was treated symptomatically and conservatively then had a seizure and after the seizure was found to be deceased with his family at bedside  Discharge Exam: Blood pressure 75/36, pulse 53, temperature 97.1 F (36.2 C), temperature source Oral, resp. rate 0, height 5\' 5"  (1.651 m), weight 77.11 kg (170 lb), SpO2 98 %. Not applicable  Disposition: Released to funeral home      Signed: Agustin Swatek L   07/21/2014, 8:00 AM

## 2014-07-27 NOTE — Progress Notes (Signed)
Notified eLink for witnessed seizure activity : 5 seconds in duration and followed by a postictal state. Pt quickly regained baseline mental status. Family member acknowledged that this had happened about 3 weeks prior and was approximately of the same duration and severity. No further.

## 2014-07-27 NOTE — Evaluation (Signed)
Received call from nursing about pt with noted unresponsiveness after witnessed seizure. Admitted earlier in the day for NSTEMI in setting of afib. Pt is DNR.  Pt assessed at bedside.  Unresponsive to any stimuli  No respirations Pulseless ? HR on monitor-likely PEA Pt's daughter at bedside Discussed overall assessment with daughter. Expressed understanding. She is contacting sibling for her to say their last goodbyes.

## 2014-07-27 NOTE — Progress Notes (Signed)
Paged Dr Legrand Rams with no reply at [facility]. Paged eLink at [phone] to relay Troponin increase to 2.0 from 0.8 No further at this time.

## 2014-07-27 DEATH — deceased

## 2014-07-28 NOTE — Discharge Summary (Signed)
[name], [name]             ACCOUNT NO.:  [id number]  MEDICAL RECORD NO.:  [id number]  LOCATION:                                 FACILITY:  PHYSICIAN:  Mackey Luan Pulling, M.D.DATE OF BIRTH:  [birth year]  DATE OF ADMISSION:  07/24/2014 DATE OF DISCHARGE:  Dec 05, 2015[facility]                              DISCHARGE SUMMARY   ADDENDUM  DISCHARGE DIAGNOSES:  Type 1 atrial flutter with AV block and left anterior fascicular block.     [name] L. Luan Pulling, M.D.     ELH/MEDQ  D:  07/28/2014  T:  07/28/2014  Job:  [id number]

## 2014-08-05 ENCOUNTER — Encounter (HOSPITAL_COMMUNITY): Payer: Self-pay | Admitting: Internal Medicine

## 2014-08-05 NOTE — Discharge Summary (Signed)
[name], [name]             ACCOUNT NO.:  [id number]  MEDICAL RECORD NO.:  [id number]  LOCATION:                                 FACILITY:  PHYSICIAN:  [place] Luan Pulling, M.D.DATE OF BIRTH:  1920/09/28  DATE OF ADMISSION:  07/10/2014 DATE OF DISCHARGE:  2015/11/28[facility]                              DISCHARGE SUMMARY   ADDENDUM:  CLINICAL DEATH SUMMARY  FINAL DISCHARGE DIAGNOSES:  Tonic-colonic seizure without status epilepticus, not intractable due to acute myocardial infarction.     Ovella Manygoats L. Luan Pulling, M.D.     ELH/MEDQ  D:  08/04/2014  T:  08/04/2014  Job:  [id number]

## 2014-10-22 ENCOUNTER — Ambulatory Visit (HOSPITAL_COMMUNITY): Payer: Medicare Other | Admitting: Hematology & Oncology

## 2014-10-22 ENCOUNTER — Other Ambulatory Visit (HOSPITAL_COMMUNITY): Payer: Medicare Other

## 2014-11-18 IMAGING — CR DG HAND COMPLETE 3+V*L*
3 series · 3 of 3 positions shown · non-contrast
Comparison: None.

CLINICAL DATA: History of injury from fall 2 days previously.  Soft
tissue swelling.

LEFT HAND - COMPLETE 3+ VIEW

[view not recorded (1 of 3)]
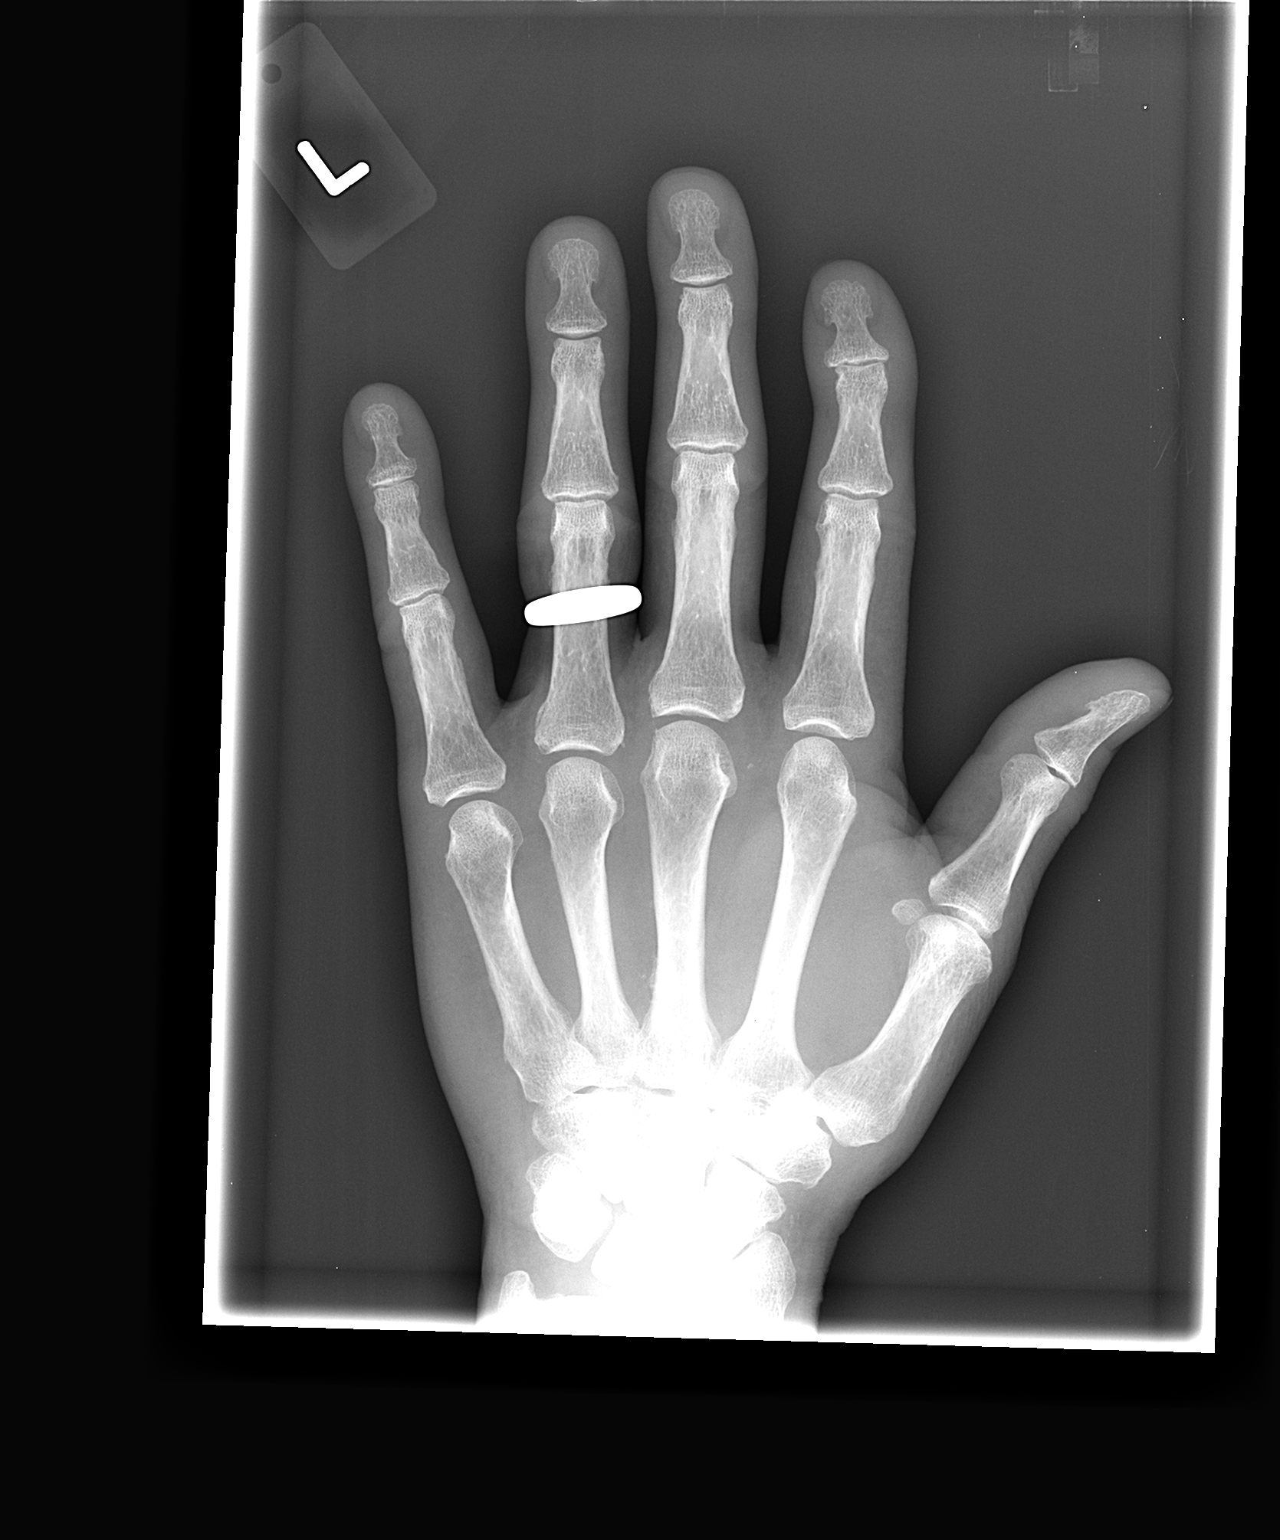

[view not recorded (2 of 3)]
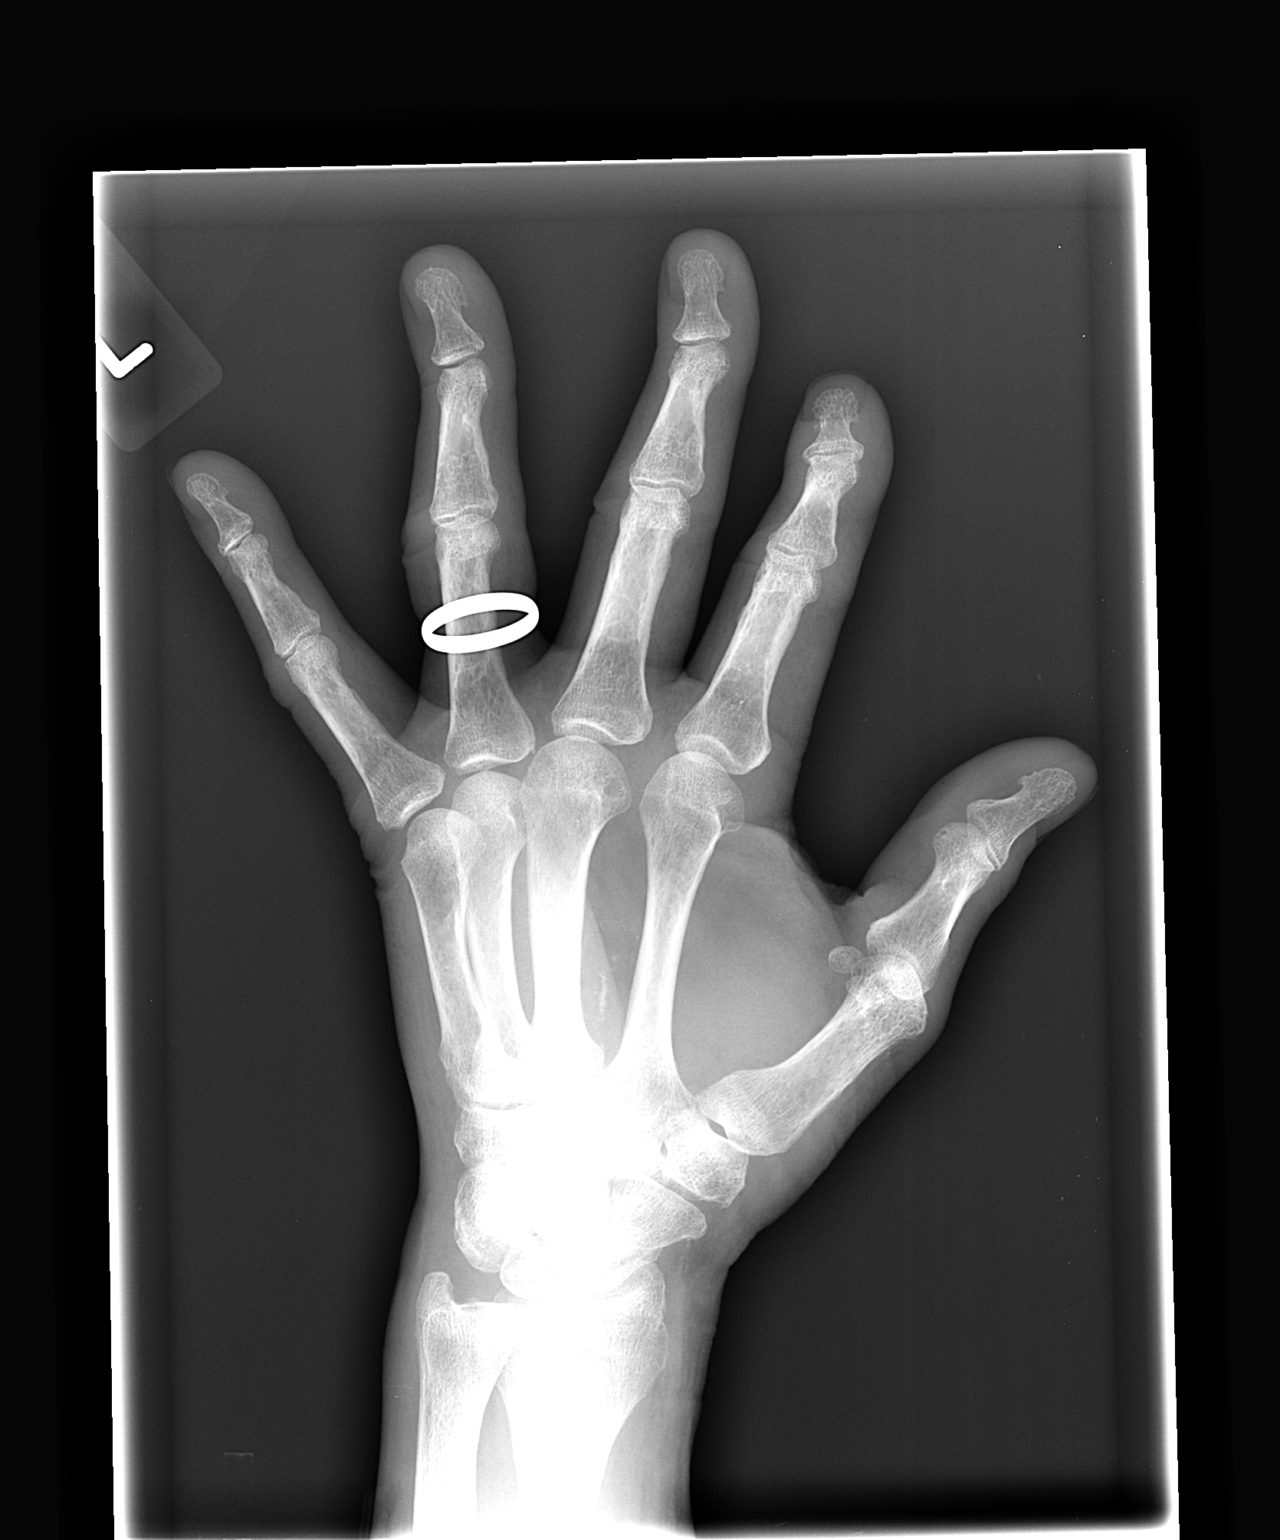

[view not recorded (3 of 3)]
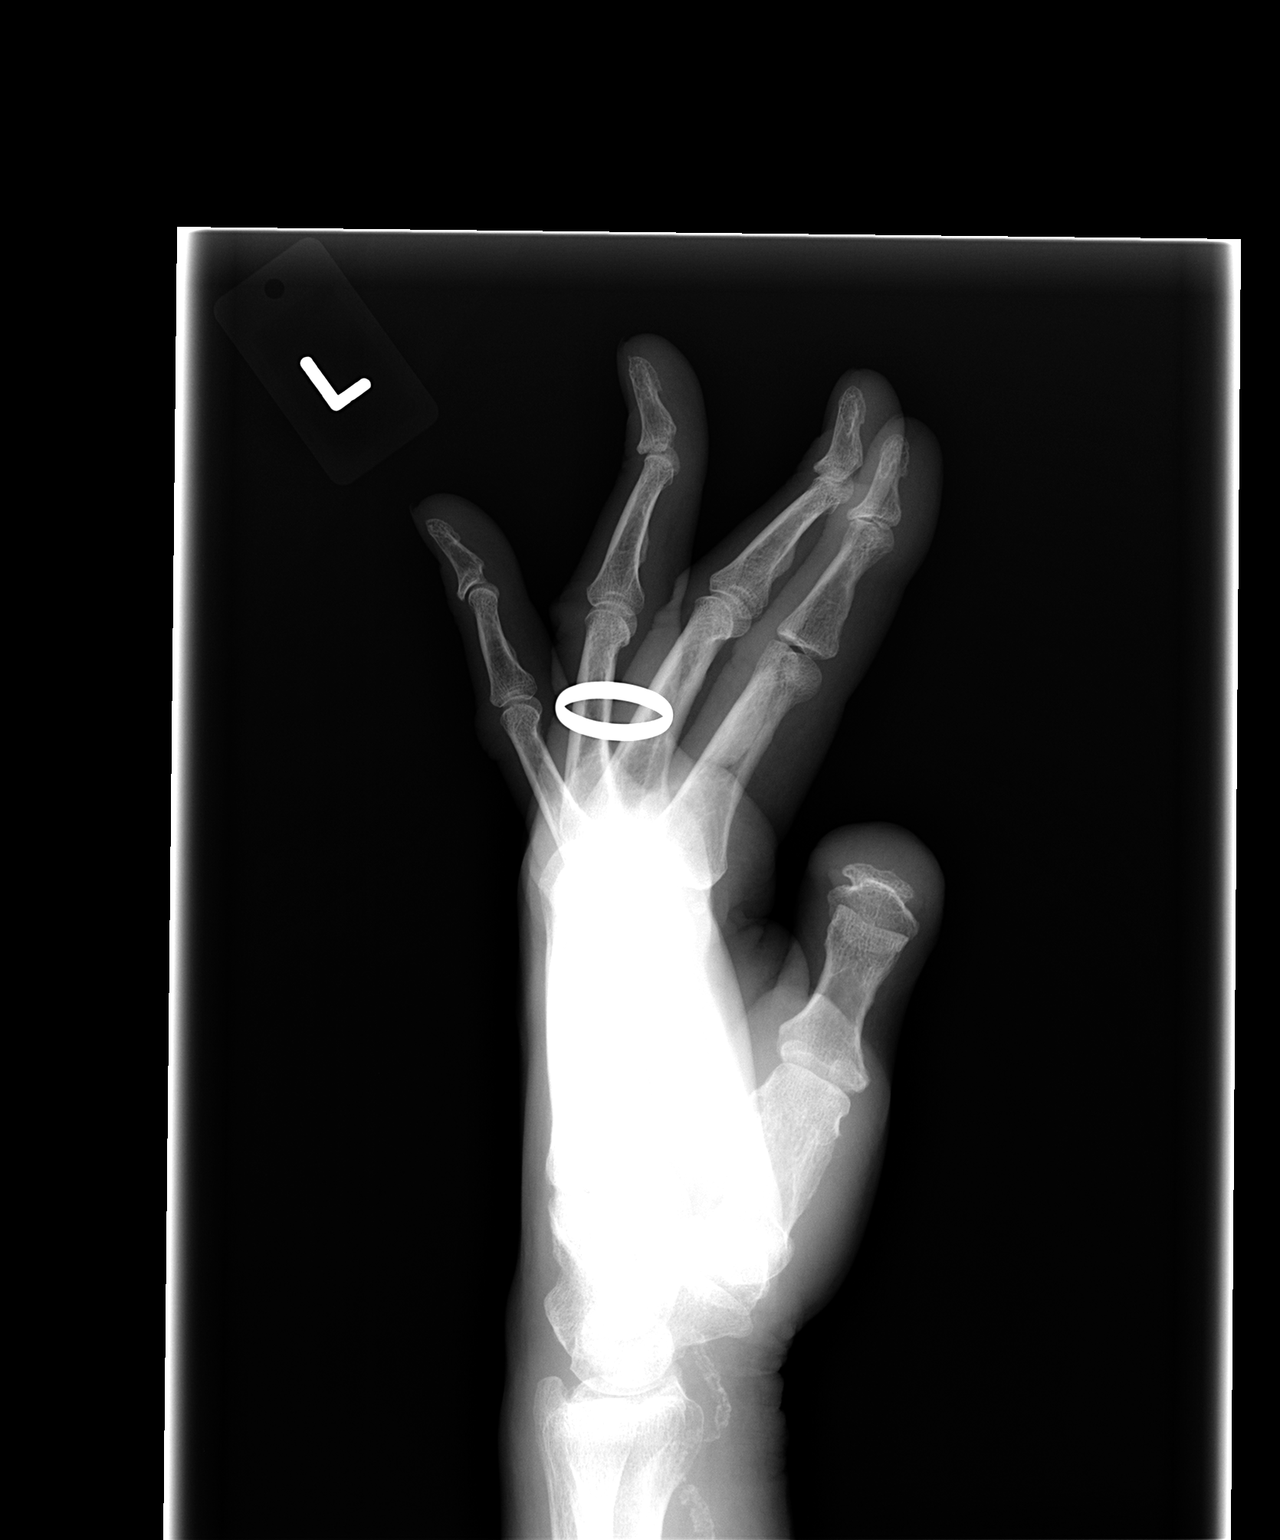

[3 of 3 positions shown; findings below may reference images not displayed]

FINDINGS: Alignment is normal.  Joint spaces are preserved.  No
fracture or dislocation is evident.
Nonaneurysmal arterial calcifications are present.  There is
minimal degenerative spurring at the fifth finger DIP joint area..
IMPRESSION: No fracture or dislocation is evident.
